# Patient Record
Sex: Female | Born: 1960
Health system: Southern US, Community
[De-identification: ages and names within clinical notes are randomized; demographics above are authoritative.]

## PROBLEM LIST (undated history)

## (undated) DIAGNOSIS — E119 Type 2 diabetes mellitus without complications: Secondary | ICD-10-CM

## (undated) DIAGNOSIS — M069 Rheumatoid arthritis, unspecified: Secondary | ICD-10-CM

## (undated) DIAGNOSIS — M75100 Unspecified rotator cuff tear or rupture of unspecified shoulder, not specified as traumatic: Secondary | ICD-10-CM

## (undated) HISTORY — PX: TUBAL LIGATION: SHX77

## (undated) HISTORY — DX: Unspecified rotator cuff tear or rupture of unspecified shoulder, not specified as traumatic: M75.100

## (undated) HISTORY — DX: Rheumatoid arthritis, unspecified: M06.9

## (undated) HISTORY — PX: OTHER SURGICAL HISTORY: SHX169

---

## 2000-12-02 ENCOUNTER — Ambulatory Visit (HOSPITAL_COMMUNITY): Admission: RE | Admit: 2000-12-02 | Discharge: 2000-12-02 | Payer: Self-pay | Admitting: Family Medicine

## 2000-12-02 ENCOUNTER — Encounter: Payer: Self-pay | Admitting: Family Medicine

## 2002-04-17 ENCOUNTER — Encounter: Payer: Self-pay | Admitting: Internal Medicine

## 2002-04-17 ENCOUNTER — Ambulatory Visit (HOSPITAL_COMMUNITY): Admission: RE | Admit: 2002-04-17 | Discharge: 2002-04-17 | Payer: Self-pay | Admitting: Internal Medicine

## 2002-07-14 ENCOUNTER — Emergency Department (HOSPITAL_COMMUNITY): Admission: EM | Admit: 2002-07-14 | Discharge: 2002-07-14 | Payer: Self-pay | Admitting: Emergency Medicine

## 2003-04-24 ENCOUNTER — Ambulatory Visit (HOSPITAL_COMMUNITY): Admission: RE | Admit: 2003-04-24 | Discharge: 2003-04-24 | Payer: Self-pay | Admitting: Family Medicine

## 2003-04-24 ENCOUNTER — Encounter: Payer: Self-pay | Admitting: Family Medicine

## 2004-04-30 ENCOUNTER — Ambulatory Visit (HOSPITAL_COMMUNITY): Admission: RE | Admit: 2004-04-30 | Discharge: 2004-04-30 | Payer: Self-pay | Admitting: Family Medicine

## 2004-09-06 ENCOUNTER — Emergency Department (HOSPITAL_COMMUNITY): Admission: EM | Admit: 2004-09-06 | Discharge: 2004-09-06 | Payer: Self-pay | Admitting: Emergency Medicine

## 2004-09-07 ENCOUNTER — Ambulatory Visit: Payer: Self-pay | Admitting: Orthopedic Surgery

## 2005-05-04 ENCOUNTER — Ambulatory Visit (HOSPITAL_COMMUNITY): Admission: RE | Admit: 2005-05-04 | Discharge: 2005-05-04 | Payer: Self-pay | Admitting: Family Medicine

## 2006-05-26 ENCOUNTER — Ambulatory Visit (HOSPITAL_COMMUNITY): Admission: RE | Admit: 2006-05-26 | Discharge: 2006-05-26 | Payer: Self-pay | Admitting: Family Medicine

## 2007-05-29 ENCOUNTER — Ambulatory Visit (HOSPITAL_COMMUNITY): Admission: RE | Admit: 2007-05-29 | Discharge: 2007-05-29 | Payer: Self-pay | Admitting: Family Medicine

## 2008-05-15 ENCOUNTER — Emergency Department (HOSPITAL_COMMUNITY): Admission: EM | Admit: 2008-05-15 | Discharge: 2008-05-15 | Payer: Self-pay | Admitting: Emergency Medicine

## 2008-05-31 ENCOUNTER — Ambulatory Visit (HOSPITAL_COMMUNITY): Admission: RE | Admit: 2008-05-31 | Discharge: 2008-05-31 | Payer: Self-pay | Admitting: Family Medicine

## 2009-10-07 ENCOUNTER — Ambulatory Visit (HOSPITAL_COMMUNITY): Admission: RE | Admit: 2009-10-07 | Discharge: 2009-10-07 | Payer: Self-pay | Admitting: Family Medicine

## 2009-12-06 ENCOUNTER — Emergency Department (HOSPITAL_COMMUNITY)
Admission: EM | Admit: 2009-12-06 | Discharge: 2009-12-06 | Payer: Self-pay | Source: Home / Self Care | Admitting: Emergency Medicine

## 2011-05-03 LAB — URINALYSIS, ROUTINE W REFLEX MICROSCOPIC
Ketones, ur: NEGATIVE
pH: 5

## 2011-05-03 LAB — BASIC METABOLIC PANEL
CO2: 24
Calcium: 9
GFR calc Af Amer: >60
GFR calc non Af Amer: >60
Glucose, Bld: 208 — ABNORMAL HIGH
Potassium: 3.7
Sodium: 134 — ABNORMAL LOW

## 2011-05-03 LAB — GLUCOSE, CAPILLARY

## 2011-05-10 ENCOUNTER — Other Ambulatory Visit (HOSPITAL_COMMUNITY): Payer: Self-pay | Admitting: Family Medicine

## 2011-05-10 DIAGNOSIS — Z139 Encounter for screening, unspecified: Secondary | ICD-10-CM

## 2011-05-11 ENCOUNTER — Ambulatory Visit (HOSPITAL_COMMUNITY)
Admission: RE | Admit: 2011-05-11 | Discharge: 2011-05-11 | Disposition: A | Discharge status: Home / Self Care | Payer: BC Managed Care – PPO | Source: Ambulatory Visit | Attending: Family Medicine | Admitting: Family Medicine

## 2011-05-11 DIAGNOSIS — Z1231 Encounter for screening mammogram for malignant neoplasm of breast: Secondary | ICD-10-CM | POA: Insufficient documentation

## 2011-05-11 DIAGNOSIS — Z139 Encounter for screening, unspecified: Secondary | ICD-10-CM

## 2011-06-03 ENCOUNTER — Other Ambulatory Visit: Payer: Self-pay | Admitting: Obstetrics & Gynecology

## 2011-06-03 ENCOUNTER — Other Ambulatory Visit (HOSPITAL_COMMUNITY)
Admission: RE | Admit: 2011-06-03 | Discharge: 2011-06-03 | Disposition: A | Discharge status: Home / Self Care | Payer: BC Managed Care – PPO | Source: Ambulatory Visit | Attending: Obstetrics & Gynecology | Admitting: Obstetrics & Gynecology

## 2011-06-03 DIAGNOSIS — Z1159 Encounter for screening for other viral diseases: Secondary | ICD-10-CM | POA: Insufficient documentation

## 2012-01-03 ENCOUNTER — Other Ambulatory Visit (HOSPITAL_COMMUNITY): Payer: Self-pay | Admitting: Family Medicine

## 2012-01-03 ENCOUNTER — Ambulatory Visit (HOSPITAL_COMMUNITY)
Admission: RE | Admit: 2012-01-03 | Discharge: 2012-01-03 | Disposition: A | Discharge status: Home / Self Care | Payer: BC Managed Care – PPO | Source: Ambulatory Visit | Attending: Family Medicine | Admitting: Family Medicine

## 2012-01-03 DIAGNOSIS — M25511 Pain in right shoulder: Secondary | ICD-10-CM

## 2012-01-03 DIAGNOSIS — R079 Chest pain, unspecified: Secondary | ICD-10-CM | POA: Insufficient documentation

## 2012-01-03 DIAGNOSIS — M25519 Pain in unspecified shoulder: Secondary | ICD-10-CM | POA: Insufficient documentation

## 2012-05-24 ENCOUNTER — Other Ambulatory Visit (HOSPITAL_COMMUNITY): Payer: Self-pay | Admitting: Family Medicine

## 2012-05-24 DIAGNOSIS — Z139 Encounter for screening, unspecified: Secondary | ICD-10-CM

## 2012-05-29 ENCOUNTER — Ambulatory Visit (HOSPITAL_COMMUNITY)
Admission: RE | Admit: 2012-05-29 | Discharge: 2012-05-29 | Disposition: A | Discharge status: Home / Self Care | Payer: BC Managed Care – PPO | Source: Ambulatory Visit | Attending: Family Medicine | Admitting: Family Medicine

## 2012-05-29 DIAGNOSIS — Z1231 Encounter for screening mammogram for malignant neoplasm of breast: Secondary | ICD-10-CM | POA: Insufficient documentation

## 2012-05-29 DIAGNOSIS — Z139 Encounter for screening, unspecified: Secondary | ICD-10-CM

## 2012-07-12 ENCOUNTER — Encounter (INDEPENDENT_AMBULATORY_CARE_PROVIDER_SITE_OTHER): Payer: Self-pay | Admitting: *Deleted

## 2012-08-16 ENCOUNTER — Encounter (INDEPENDENT_AMBULATORY_CARE_PROVIDER_SITE_OTHER): Payer: Self-pay | Admitting: *Deleted

## 2012-08-16 ENCOUNTER — Telehealth (INDEPENDENT_AMBULATORY_CARE_PROVIDER_SITE_OTHER): Payer: Self-pay | Admitting: *Deleted

## 2012-08-16 ENCOUNTER — Other Ambulatory Visit (INDEPENDENT_AMBULATORY_CARE_PROVIDER_SITE_OTHER): Payer: Self-pay | Admitting: *Deleted

## 2012-08-16 DIAGNOSIS — Z1211 Encounter for screening for malignant neoplasm of colon: Secondary | ICD-10-CM

## 2012-08-16 MED ORDER — PEG-KCL-NACL-NASULF-NA ASC-C 100 G PO SOLR: 1.0000 | Freq: Once | ORAL | Status: DC

## 2012-08-16 NOTE — Telephone Encounter (Signed)
Patient needs movi prep 

## 2012-08-30 ENCOUNTER — Telehealth (INDEPENDENT_AMBULATORY_CARE_PROVIDER_SITE_OTHER): Payer: Self-pay | Admitting: *Deleted

## 2012-08-30 NOTE — Telephone Encounter (Signed)
  Procedure: tcs  Reason/Indication:  screening  Has patient had this procedure before?  no  If so, when, by whom and where?    Is there a family history of colon cancer?  no  Who?  What age when diagnosed?    Is patient diabetic?   yes      Does patient have prosthetic heart valve?  no  Do you have a pacemaker?  no  Has patient had joint replacement within last 12 months?  no  Is patient on Coumadin, Plavix and/or Aspirin? yes  Medications: metformin 500 mg 2 in am & 1 in pm, hydrocodone 10/325 mg 1 tab q 4 hrs for pain, asa 81 mg daily  Allergies: nkda  Medication Adjustment: asa 2 days, hold metformin evening before & morning of   Procedure date & time: 09/27/12 at 1030

## 2012-08-30 NOTE — Telephone Encounter (Signed)
agree

## 2012-09-15 ENCOUNTER — Ambulatory Visit (HOSPITAL_COMMUNITY)
Admission: RE | Admit: 2012-09-15 | Discharge: 2012-09-15 | Disposition: A | Discharge status: Home / Self Care | Payer: BC Managed Care – PPO | Source: Ambulatory Visit | Attending: Family Medicine | Admitting: Family Medicine

## 2012-09-15 ENCOUNTER — Other Ambulatory Visit (HOSPITAL_COMMUNITY): Payer: Self-pay | Admitting: Family Medicine

## 2012-09-15 DIAGNOSIS — M249 Joint derangement, unspecified: Secondary | ICD-10-CM | POA: Insufficient documentation

## 2012-09-15 DIAGNOSIS — IMO0002 Reserved for concepts with insufficient information to code with codable children: Secondary | ICD-10-CM | POA: Insufficient documentation

## 2012-09-15 DIAGNOSIS — Z9181 History of falling: Secondary | ICD-10-CM | POA: Insufficient documentation

## 2012-09-15 DIAGNOSIS — M779 Enthesopathy, unspecified: Secondary | ICD-10-CM

## 2012-09-15 DIAGNOSIS — M25519 Pain in unspecified shoulder: Secondary | ICD-10-CM | POA: Insufficient documentation

## 2012-09-15 DIAGNOSIS — M75100 Unspecified rotator cuff tear or rupture of unspecified shoulder, not specified as traumatic: Secondary | ICD-10-CM | POA: Insufficient documentation

## 2012-09-19 ENCOUNTER — Encounter (HOSPITAL_COMMUNITY): Payer: Self-pay | Admitting: Pharmacy Technician

## 2012-09-27 ENCOUNTER — Ambulatory Visit (HOSPITAL_COMMUNITY)
Admission: RE | Admit: 2012-09-27 | Discharge: 2012-09-27 | Disposition: A | Discharge status: Home / Self Care | Payer: BC Managed Care – PPO | Source: Ambulatory Visit | Attending: Internal Medicine | Admitting: Internal Medicine

## 2012-09-27 ENCOUNTER — Encounter (HOSPITAL_COMMUNITY): Payer: Self-pay | Admitting: *Deleted

## 2012-09-27 ENCOUNTER — Encounter (HOSPITAL_COMMUNITY)
Admission: RE | Disposition: A | Discharge status: Home / Self Care | Payer: Self-pay | Source: Ambulatory Visit | Attending: Internal Medicine

## 2012-09-27 DIAGNOSIS — D126 Benign neoplasm of colon, unspecified: Secondary | ICD-10-CM | POA: Insufficient documentation

## 2012-09-27 DIAGNOSIS — Z01812 Encounter for preprocedural laboratory examination: Secondary | ICD-10-CM | POA: Insufficient documentation

## 2012-09-27 DIAGNOSIS — Z1211 Encounter for screening for malignant neoplasm of colon: Secondary | ICD-10-CM

## 2012-09-27 DIAGNOSIS — E119 Type 2 diabetes mellitus without complications: Secondary | ICD-10-CM | POA: Insufficient documentation

## 2012-09-27 HISTORY — DX: Type 2 diabetes mellitus without complications: E11.9

## 2012-09-27 HISTORY — PX: COLONOSCOPY: SHX5424

## 2012-09-27 LAB — GLUCOSE, CAPILLARY

## 2012-09-27 SURGERY — COLONOSCOPY
Anesthesia: Moderate Sedation

## 2012-09-27 MED ORDER — MIDAZOLAM HCL 5 MG/5ML IJ SOLN
INTRAMUSCULAR | Status: DC | PRN
  Administered 2012-09-27: 2 mg via INTRAVENOUS
  Administered 2012-09-27: 1 mg via INTRAVENOUS

## 2012-09-27 MED ORDER — STERILE WATER FOR IRRIGATION IR SOLN
Status: DC | PRN
  Administered 2012-09-27: 11:00:00

## 2012-09-27 MED ORDER — MIDAZOLAM HCL 5 MG/5ML IJ SOLN
INTRAMUSCULAR | Status: AC
  Filled 2012-09-27: qty 10

## 2012-09-27 MED ORDER — SODIUM CHLORIDE 0.45 % IV SOLN
INTRAVENOUS | Status: DC
  Administered 2012-09-27: 10:00:00 via INTRAVENOUS

## 2012-09-27 MED ORDER — MEPERIDINE HCL 50 MG/ML IJ SOLN
INTRAMUSCULAR | Status: AC
  Filled 2012-09-27: qty 1

## 2012-09-27 MED ORDER — MEPERIDINE HCL 50 MG/ML IJ SOLN
INTRAMUSCULAR | Status: DC | PRN
  Administered 2012-09-27 (×2): 25 mg via INTRAVENOUS

## 2012-09-27 NOTE — Op Note (Signed)
COLONOSCOPY PROCEDURE REPORT  PATIENT:  Brooke Sawyer  MR#:  [id number] Birthdate:  1961-05-22, 52 y.o., female Endoscopist:  Dr. Malissa Hippo, MD Referred By:  Dr. Alice Reichert, MD Procedure Date: 09/27/2012  Procedure:   Colonoscopy  Indications:  Patient is 52 year old African female who is  undergoing average risk screening colonoscopy.  Informed Consent:  The procedure and risks were reviewed with the patient and informed consent was obtained.  Medications:  Demerol 50 mg IV Versed 3 mg IV  Description of procedure:  After a digital rectal exam was performed, that colonoscope was advanced from the anus through the rectum and colon to the area of the cecum, ileocecal valve and appendiceal orifice. The cecum was deeply intubated. These structures were well-seen and photographed for the record. From the level of the cecum and ileocecal valve, the scope was slowly and cautiously withdrawn. The mucosal surfaces were carefully surveyed utilizing scope tip to flexion to facilitate fold flattening as needed. The scope was pulled down into the rectum where a thorough exam including retroflexion was performed.  Findings:   Prep excellent. 3 mm polyp ablated via cold biopsy from splenic flexure. Mucosa rest of the colon and rectum was normal. Unremarkable anorectal junction.  Therapeutic/Diagnostic Maneuvers Performed:  See above  Complications:  None  Cecal Withdrawal Time:  10 minutes  Impression:  Examination performed to cecum. 3 mm polyp ablated via cold biopsy from splenic flexure. No evidence of diverticulosis.  Recommendations:  Standard instructions given. I will contact patient with biopsy results and further recommendations. Given today's findings she can wait 10 years before her next screening exam.  [name] U  09/27/2012 11:34 AM  CC: Dr. Alice Reichert, MD & Dr. Bonnetta Barry ref. provider found

## 2012-09-27 NOTE — H&P (Signed)
Brooke Sawyer is an 52 y.o. female.   Chief Complaint: Patient is here for colonoscopy.  HPI: Patient is 52 year old African female who is in for screening colonoscopy. She denies abdominal pain change in bowel habits or rectal bleeding. Family history is negative for colorectal carcinoma.  Past Medical History  Diagnosis Date  . Diabetes mellitus without complication     Past Surgical History  Procedure Laterality Date  . Tubal ligation    . Dilation and currettage      Family History  Problem Relation Age of Onset  . Colon cancer Neg Hx    Social History:  reports that she has quit smoking. Her smoking use included Cigarettes. She has a 3 pack-year smoking history. She does not have any smokeless tobacco history on file. She reports that she does not drink alcohol or use illicit drugs.  Allergies: No Known Allergies  Medications Prior to Admission  Medication Sig Dispense Refill  . peg 3350 powder (MOVIPREP) 100 G SOLR Take 1 kit (100 g total) by mouth once.  1 kit  0  . aspirin EC 81 MG tablet Take 81 mg by mouth daily.      . Cyanocobalamin (B-12 PO) Take 1 tablet by mouth daily.      . fish oil-omega-3 fatty acids 1000 MG capsule Take 2 g by mouth daily.      Marland Kitchen HYDROcodone-acetaminophen (NORCO) 10-325 MG per tablet Take 1 tablet by mouth every 6 (six) hours as needed for pain.      . metFORMIN (GLUCOPHAGE) 500 MG tablet Take 500-1,000 mg by mouth 2 (two) times daily with a meal. Takes 1000 mg in the morning and 500 mg in the evening        Results for orders placed during the hospital encounter of 09/27/12 (from the past 48 hour(s))  GLUCOSE, CAPILLARY     Status: Abnormal   Collection Time    09/27/12  9:47 AM      Result Value Range   Glucose-Capillary 219 (*) 70 - 99 mg/dL   Comment 1 Notify RN     No results found.  ROS  Blood pressure 118/68, pulse 84, temperature 98.2 F (36.8 C), temperature source Oral, resp. rate 18, height 5\' 4"  (1.626 m), weight 219 lb  (99.338 kg), last menstrual period 09/24/2012, SpO2 96.00%. Physical Exam  Constitutional: She appears well-developed and well-nourished.  HENT:  Mouth/Throat: Oropharynx is clear and moist.  Eyes: No scleral icterus.  Neck: No thyromegaly present.  Cardiovascular: Normal rate, regular rhythm and normal heart sounds.   No murmur heard. Respiratory: Effort normal and breath sounds normal.  GI: Soft. She exhibits no distension and no mass. There is no tenderness.  Musculoskeletal: She exhibits no edema.  Lymphadenopathy:    She has no cervical adenopathy.  Neurological: She is alert.  Skin: Skin is warm.     Assessment/Plan Average risk screening colonoscopy.  Raiden Yearwood U 09/27/2012, 11:06 AM

## 2012-10-02 ENCOUNTER — Encounter (HOSPITAL_COMMUNITY): Payer: Self-pay | Admitting: Internal Medicine

## 2012-10-04 ENCOUNTER — Encounter (INDEPENDENT_AMBULATORY_CARE_PROVIDER_SITE_OTHER): Payer: Self-pay | Admitting: *Deleted

## 2012-10-18 ENCOUNTER — Encounter: Payer: Self-pay | Admitting: Orthopedic Surgery

## 2012-10-18 ENCOUNTER — Ambulatory Visit (INDEPENDENT_AMBULATORY_CARE_PROVIDER_SITE_OTHER): Payer: BC Managed Care – PPO | Admitting: Orthopedic Surgery

## 2012-10-18 VITALS — BP 140/82 | Ht 64.0 in | Wt 213.0 lb

## 2012-10-18 DIAGNOSIS — M75110 Incomplete rotator cuff tear or rupture of unspecified shoulder, not specified as traumatic: Secondary | ICD-10-CM | POA: Insufficient documentation

## 2012-10-18 DIAGNOSIS — M75111 Incomplete rotator cuff tear or rupture of right shoulder, not specified as traumatic: Secondary | ICD-10-CM

## 2012-10-18 DIAGNOSIS — M67919 Unspecified disorder of synovium and tendon, unspecified shoulder: Secondary | ICD-10-CM

## 2012-10-18 DIAGNOSIS — M75101 Unspecified rotator cuff tear or rupture of right shoulder, not specified as traumatic: Secondary | ICD-10-CM

## 2012-10-18 NOTE — Progress Notes (Addendum)
Patient ID: Brooke Sawyer, female   DOB: [birth year], 52 y.o.   MRN: [id number] Chief Complaint  Patient presents with  . Shoulder Pain    Right shoulder pain Referred by Dr. Renard Matter   History  The patient complains of right shoulder pain after she fell onto her right shoulder a year ago. She did not seek medical treatment treated herself with active range of motion and regain most of her range of motion and strength.  She now presents with sharp constant pain over the right shoulder in the morning as well as the evening and is worse with any "wrong" movement. This includes internal rotation primarily. She tried some medications it did not help she reports a catching sensation and loss of full range of motion. She does not complaining of weakness.  She is oriented and MRI and she has had x-ray  The x-ray and MRI show that she has a partial rotator cuff tear topside down or bursal surface tear.  Medications aspirin metformin and hydrocodone 10 mg  Family history diabetes  Social history married.  BP 140/82  Ht 5\' 4"  (1.626 m)  Wt 213 lb (96.616 kg)  BMI 36.54 kg/m2 General appearance is normal, the patient is alert and oriented x3 with normal mood and affect.  Ambulation is normal  Neck is nontender with full range of motion no swelling skin normal  Left shoulder no swelling no tenderness, full range of motion. Apprehension test normal. Rotator cuff strength 5. Skin normal. No palpable lymph nodes. Normal pulse and normal sensation  Right shoulder tenderness in the bursal area. Primarily inferior to the posterior acromion. Flexion and external rotation and internal rotation are limited active and passive I can passively flex his shoulder to 150 but has painful between 90 and 150. Apprehension test is normal cuff strength grade 5 skin normal. Pulse normal. Sensation normal. Lymph nodes negative.  I reviewed the x-rays the MRI and both reports and I agree she is a partial rotator cuff  tear with some elements of full thickness component she has some mild a.c. joint arthrosis not symptomatic there is bursitis as well as arthritis  Base of the patient's clinical findings I think that strength wise I cannot make her better with surgery however we can improve her range of motion with injection and therapy  If she doesn't get better after 6 weeks we'll consider surgical treatment  Shoulder Injection Procedure Note   Pre-operative Diagnosis: right  RC Syndrome  Post-operative Diagnosis: same  Indications: pain   Anesthesia: ethyl chloride   Procedure Details   Verbal consent was obtained for the procedure. The shoulder was prepped withalcohol and the skin was anesthetized. A 20 gauge needle was advanced into the subacromial space through posterior approach without difficulty  The space was then injected with 3 ml 1% lidocaine and 1 ml of depomedrol. The injection site was cleansed with isopropyl alcohol and a dressing was applied.  Complications:  None; patient tolerated the procedure well.  Partial nontraumatic tear of rotator cuff, right  Rotator cuff syndrome of right shoulder

## 2012-10-18 NOTE — Patient Instructions (Addendum)
You have received a steroid shot. 15% of patients experience increased pain at the injection site with in the next 24 hours. This is best treated with ice and tylenol extra strength 2 tabs every 8 hours. If you are still having pain please call the office.   Home exercises  Call if no better

## 2012-10-24 ENCOUNTER — Telehealth: Payer: Self-pay | Admitting: Orthopedic Surgery

## 2012-10-24 NOTE — Telephone Encounter (Signed)
Brooke Sawyer got an injection in her right shoulder 10/18/12, she has been having sharpe pains in her arm since that day but the pain got worse this past Monday, 10/23/12.  She is taking  xtra strength Tylenol every 4 hours and also taking Hydrocodone 10/325 (that she already had from her PCP) without any relief.  Said she is also doing the exercises you gave her.

## 2012-10-25 NOTE — Telephone Encounter (Signed)
Left a message to call us back.

## 2012-10-25 NOTE — Telephone Encounter (Signed)
Patient called back relayed doctor's reply

## 2012-10-25 NOTE — Telephone Encounter (Signed)
Add ibuprofen 800 mg 3 x a day

## 2012-11-28 ENCOUNTER — Encounter: Payer: Self-pay | Admitting: Orthopedic Surgery

## 2012-11-28 ENCOUNTER — Ambulatory Visit (INDEPENDENT_AMBULATORY_CARE_PROVIDER_SITE_OTHER): Payer: BC Managed Care – PPO | Admitting: Orthopedic Surgery

## 2012-11-28 VITALS — BP 130/80 | Ht 64.0 in | Wt 213.0 lb

## 2012-11-28 DIAGNOSIS — M75110 Incomplete rotator cuff tear or rupture of unspecified shoulder, not specified as traumatic: Secondary | ICD-10-CM

## 2012-11-28 DIAGNOSIS — M75111 Incomplete rotator cuff tear or rupture of right shoulder, not specified as traumatic: Secondary | ICD-10-CM

## 2012-11-28 NOTE — Patient Instructions (Addendum)
Here is some information if you decide to have surgery   Surgery for Rotator Cuff Tear with Rehab Rotator cuff surgery is only recommended for individuals who have experienced persistent disability for greater than 3 months of non-surgical (conservative) treatment. Surgery is not necessary but is recommended for individuals who experience difficulty completing daily activities or athletes who are unable to compete. Rotator cuff tears do not usually heal without surgical intervention. If left alone small rotator cuff tears usually become larger. Younger athletes who have a rotator cuff tear may be recommended for surgery without attempting conservative rehabilitation. The purpose of surgery is to regain function of the shoulder joint and eliminate pain associated with the injury. In addition to repairing the tendon tear, the surgery often removes a portion of the bony roof of the shoulder (acromion) as well as the chronically thickened and inflamed membrane below the acromion (subacromial bursa). REASONS NOT TO OPERATE   Infection of the shoulder.  Inability to complete a rehabilitation program.  Patients who have other conditions (emotional or psychological) conditions that contribute to their shoulder condition. RISKS AND COMPLICATIONS  Infection.  Re-tear of the rotator cuff tendons or muscles.  Shoulder stiffness and/or weakness.  Inability to compete in athletics.  Acromioclavicular (AC) joint paint.  Risks of surgery: infection, bleeding, nerve damage, or damage to surrounding tissues. TECHNIQUE There are different surgical procedures used to treat rotator cuff tears. The type of procedure depends on the extent of injury as well as the surgeon's preference. All of the surgical techniques for rotator cuff tears have the same goal of repairing the torn tendon, removing part of the acromion, and removing the subacromial bursa. There are two main types of procedures: arthroscopic and open  incision. Arthroscopic procedures are usually completed and you go home the same day as surgery (outpatient). These procedures use multiple small incisions in which tools and a video camera are placed to work on the shoulder. An electric shaver removes the bursa, then a power burr shaves down the portion of the acromion that places pressure on the rotator cuff. Finally the rotator cuff is sewed (sutured) back to the humeral head. Open incision procedures require a larger incision. The deltoid muscle is detached from the acromion and a ligament in the shoulder (coracoacromial) is cut in order for the surgeon to access the rotator cuff. The subacromial bursa is removed as well as part of the acromion to give the rotator cuff room to move freely. The torn tendon is then sutured to the humeral head. After the rotator cuff is repaired, then the deltoid is reattached and the incision is closed up.  RECOVERY   Post-operative care depends on the surgical technique and the preferences of your therapist.  Keep the wound clean and dry for the first 10 to 14 days after surgery.  Keep your shoulder and arm in the sling provided to you for as long as you have been instructed to.  You will be given pain medications by your caregiver.  Passive (without using muscles) shoulder movements may be begun immediately after surgery.  It is important to follow through with you rehabilitation program in order to have the best possible recovery. RETURN TO SPORTS   The rehabilitation period will depend on the sport and position you play as well as the success of the operation.  The minimum recovery period is 6 months.  You must have regained complete shoulder motion and strength before returning to sports. SEEK IMMEDIATE MEDICAL CARE IF:  Any medications produce adverse side effects.  Any complications from surgery occur:  Pain, numbness, or coldness in the extremity operated upon.  Discoloration of the nail  beds (they become blue or gray) of the extremity operated upon.  Signs of infections (fever, pain, inflammation, redness, or persistent bleeding). EXERCISES  RANGE OF MOTION (ROM) AND STRETCHING EXERCISES - Rotator Cuff Tear, Surgery For These exercises may help you restore your elbow mobility once your physician has discontinued your immobilization period. Beginning these before your provider's approval may result in delayed healing. Your symptoms may resolve with or without further involvement from your physician, physical therapist or athletic trainer. While completing these exercises, remember:   Restoring tissue flexibility helps normal motion to return to the joints. This allows healthier, less painful movement and activity.  An effective stretch should be held for at least 30 seconds. A stretch should never be painful. You should only feel a gentle lengthening or release in the stretch. ROM - Pendulum   Bend at the waist so that your right / left arm falls away from your body. Support yourself with your opposite hand on a solid surface, such as a table or a countertop.  Your right / left arm should be perpendicular to the ground. If it is not perpendicular, you need to lean over farther. Relax the muscles in your right / left arm and shoulder as much as possible.  Gently sway your hips and trunk so they move your right / left arm without any use of your right / left shoulder muscles.  Progress your movements so that your right / left arm moves side to side, then forward and backward, and finally, both clockwise and counterclockwise.  Complete __________ repetitions in each direction. Many people use this exercise to relieve discomfort in their shoulder as well as to gain range of motion. Repeat __________ times. Complete this exercise __________ times per day. STRETCH  Flexion, Seated   Sit in a firm chair so that your right / left forearm can rest on a table or on a table or  countertop. Your right / left elbow should rest below the height of your shoulder so that your shoulder feels supported and not tense or uncomfortable.  Keeping your right / left shoulder relaxed, lean forward at your waist, allowing your right / left hand to slide forward. Bend forward until you feel a moderate stretch in your shoulder, but before you feel an increase in your pain.  Hold __________ seconds. Slowly return to your starting position. Repeat __________ times. Complete this exercise __________ times per day.  STRETCH  Flexion, Standing   Stand with good posture. With an underhand grip on your right / left and an overhand grip on the opposite hand, grasp a broomstick or cane so that your hands are a little more than shoulder-width apart.  Keeping your right / left elbow straight and shoulder muscles relaxed, push the stick with your opposite hand to raise your right / left arm in front of your body and then overhead. Raise your arm until you feel a stretch in your right / left shoulder, but before you have increased shoulder pain.  Try to avoid shrugging your right / left shoulder as your arm rises by keeping your shoulder blade tucked down and toward your mid-back spine. Hold __________ seconds.  Slowly return to the starting position. Repeat __________ times. Complete this exercise __________ times per day.  STRETCH  Abduction, Supine   Stand with good posture.  With an underhand grip on your right / left and an overhand grip on the opposite hand, grasp a broomstick or cane so that your hands are a little more than shoulder-width apart.  Keeping your right / left elbow straight and shoulder muscles relaxed, push the stick with your opposite hand to raise your right / left arm out to the side of your body and then overhead. Raise your arm until you feel a stretch in your right / left shoulder, but before you have increased shoulder pain.  Try to avoid shrugging your right / left  shoulder as your arm rises by keeping your shoulder blade tucked down and toward your mid-back spine. Hold __________ seconds.  Slowly return to the starting position. Repeat __________ times. Complete this exercise __________ times per day.  ROM  Flexion, Active-Assisted  Lie on your back. You may bend your knees for comfort.  Grasp a broomstick or cane so your hands are about shoulder-width apart. Your right / left hand should grip the end of the stick/cane so that your hand is positioned "thumbs-up," as if you were about to shake hands.  Using your healthy arm to lead, raise your right / left arm overhead until you feel a gentle stretch in your shoulder. Hold __________ seconds.  Use the stick/cane to assist in returning your right / left arm to its starting position. Repeat __________ times. Complete this exercise __________ times per day.  STRETCH  External Rotation   Tuck a folded towel or small ball under your right / left upper arm. Grasp a broomstick or cane with an underhand grasp a little more than shoulder width apart. Bend your elbows to 90 degrees.  Stand with good posture or sit in a chair without arms.  Use your strong arm to push the stick across your body. Do not allow the towel or ball to fall. This will rotate your right / left arm away from your abdomen. Using the stick turn/rotate your hand and forearm away from your body. Hold __________ seconds. Repeat __________ times. Complete this exercise __________ times per day.  STRENGTHENING EXERCISES - Rotator Cuff Tear, Surgery For These exercises may help you begin to restore your elbow strength in the initial stage of your rehabilitation. Your physician will determine when you begin these exercises depending on the severity of your injury and the integrity of your repaired tissues. Beginning these before your provider's approval may result in delayed healing. While completing these exercises, remember:   Muscles can gain  both the endurance and the strength needed for everyday activities through controlled exercises.  Complete these exercises as instructed by your physician, physical therapist or athletic trainer. Progress the resistance and repetitions only as guided.  You may experience muscle soreness or fatigue, but the pain or discomfort you are trying to eliminate should never worsen during these exercises. If this pain does worsen, stop and make certain you are following the directions exactly. If the pain is still present after adjustments, discontinue the exercise until you can discuss the trouble with your clinician. STRENGTH - Shoulder Flexion, Isometric   With good posture and facing a wall, stand or sit about 4-6 inches away.  Keeping your right / left elbow straight, gently press the top of your fist into the wall. Increase the pressure gradually until you are pressing as hard as you can without shrugging your shoulder or increasing any shoulder discomfort.  Hold __________ seconds. Release the tension slowly. Relax your shoulder muscles completely  before you do the next repetition. Repeat __________ times. Complete this exercise __________ times per day.  STRENGTH - Shoulder Abductors, Isometric   With good posture, stand or sit about 4-6 inches from a wall with your right / left side facing the wall.  Bend your right / left elbow. Gently press your right / left elbow into the wall. Increase the pressure gradually until you are pressing as hard as you can without shrugging your shoulder or increasing any shoulder discomfort.  Hold __________ seconds.  Release the tension slowly. Relax your shoulder muscles completely before you do the next repetition. Repeat __________ times. Complete this exercise __________ times per day.  STRENGTH - Internal Rotators, Isometric   Keep your right / left elbow at your side and bend it 90 degrees.  Step into a door frame so that the inside of your right /  left wrist can press against the door frame without your upper arm leaving your side.  Gently press your right / left wrist into the door frame as if you were trying to draw the palm of your hand to your abdomen. Gradually increase the tension until you are pressing as hard as you can without shrugging your shoulder or increasing any shoulder discomfort.  Hold __________ seconds.  Release the tension slowly. Relax your shoulder muscles completely before you do the next repetition. Repeat __________ times. Complete this exercise __________ times per day.  STRENGTH - External Rotators, Isometric   Keep your right / left elbow at your side and bend it 90 degrees.  Step into a door frame so that the outside of your right / left wrist can press against the door frame without your upper arm leaving your side.  Gently press your right / left wrist into the door frame as if you were trying to swing the back of your hand away from your abdomen. Gradually increase the tension until you are pressing as hard as you can without shrugging your shoulder or increasing any shoulder discomfort.  Hold __________ seconds.  Release the tension slowly. Relax your shoulder muscles completely before you do the next repetition. Repeat __________ times. Complete this exercise __________ times per day.  Document Released: 07/19/2005 Document Revised: 10/11/2011 Document Reviewed: 10/31/2008 [facility] Patient Information 2013 [place], [place].  You have been scheduled for rotator cuff surgery.  All surgeries carry some risk.  Remember you always have the option of continued nonsurgical treatment. However in this situation the risks vs. the benefits favor surgery as the best treatment option. The risks of the surgery includes the following but is not limited to bleeding, infection, pulmonary embolus, death from anesthesia, nerve injury vascular injury or need for further surgery, continued pain.  Specific to this  procedure the following risks and complications are rare but possible Stiffness, pain, weakness, re tear.  I expect 9-12 months to fully recover from this procedure.

## 2012-11-28 NOTE — Progress Notes (Signed)
Patient ID: Brooke Sawyer, female   DOB: [birth year], 52 y.o.   MRN: [id number] Chief Complaint  Patient presents with  . Follow-up    Recheck right shoulder post injection.   Ht 5\' 4"  (1.626 m)  Wt 213 lb (96.616 kg)  BMI 36.54 kg/m2    Status post physical therapy and injection presents with continued pain popping right shoulder  MRI was done prior to last visit IMPRESSION: 1.  Near full-thickness anterior insertional supraspinatus tendon tear extending to the bursal surface.  About 20% of the articular surface fibers remain intact and there is no significant retraction.  Width of the intrasubstance component of the tear is 23 mm and extends into the conjoined fibers of supraspinatus and infraspinatus. 2. Subacromial bursitis. 3.  Mild glenohumeral osteoarthritis.  The patient reports increased pain swelling and crepitance decreased range of motion seems to bother her more at night  We talked about surgery she did not want to proceed  She opted for injection  We injected the subacromial space of the right shoulder  Her physical findings were as follows  BP 130/80  Ht 5\' 4"  (1.626 m)  Wt 213 lb (96.616 kg)  BMI 36.54 kg/m2 General appearance is normal, the patient is alert and oriented x3 with normal mood and affect. Right shoulder inspection tenderness in the anterior joint line anterolateral deltoid with forward elevation only 100 weakness of the supraspinatus tendon to manual muscle testing but the shoulder was stable, strength as stated skin intact pulses were good sensation was normal  We did a subacromial injection of the right shoulder  She will think about the surgical treatment as we discussed  Shoulder Injection Procedure Note   Pre-operative Diagnosis: right  RC Syndrome  Post-operative Diagnosis: same  Indications: pain   Anesthesia: ethyl chloride   Procedure Details   Verbal consent was obtained for the procedure. The shoulder was prepped  withalcohol and the skin was anesthetized. A 20 gauge needle was advanced into the subacromial space through posterior approach without difficulty  The space was then injected with 3 ml 1% lidocaine and 1 ml of depomedrol. The injection site was cleansed with isopropyl alcohol and a dressing was applied.  Complications:  None; patient tolerated the procedure well.

## 2012-12-05 ENCOUNTER — Telehealth: Payer: Self-pay | Admitting: Orthopedic Surgery

## 2012-12-05 NOTE — Telephone Encounter (Signed)
Patient called, 1 week following office visit and injection to right shoulder, to relay that the injection has not helped much.  She said that her shoulder really has been sore, and that she knows she will need to decide about the surgery, as per her office note.  She will consider having it, possibly over the summer, and will call to schedule another appointment to further discuss, closer to that time.  She is continuing her home exercises until then, unless there are any other recommendations.  Her ph# is [phone].

## 2012-12-06 NOTE — Telephone Encounter (Signed)
Returned call to patient, and she stated Okey Regal had answered all her questions. She said she would call back when ready for surgery.

## 2012-12-14 ENCOUNTER — Telehealth: Payer: Self-pay | Admitting: Orthopedic Surgery

## 2012-12-14 ENCOUNTER — Other Ambulatory Visit: Payer: Self-pay | Admitting: *Deleted

## 2012-12-14 DIAGNOSIS — M25511 Pain in right shoulder: Secondary | ICD-10-CM

## 2012-12-14 MED ORDER — HYDROCODONE-ACETAMINOPHEN 5-325 MG PO TABS: 1.0000 | ORAL_TABLET | ORAL | Status: DC | PRN

## 2012-12-14 NOTE — Telephone Encounter (Signed)
norco 5 mg # 30  1 q 4 prn pain   No refills

## 2012-12-14 NOTE — Telephone Encounter (Signed)
Forwarding to Dr. Harrison 

## 2012-12-14 NOTE — Telephone Encounter (Signed)
Faxed prescription to [facility] [place] 

## 2012-12-19 ENCOUNTER — Telehealth: Payer: Self-pay | Admitting: Orthopedic Surgery

## 2012-12-19 NOTE — Telephone Encounter (Signed)
Patient called to say, that the Norco 5-325 is not helping her pain like the Norco 10-325 that she was previously on. I advised her to try to alternate with Ibuprofen, and she stated she had already tried that. She wanted to know if you would give her a prescription for 10-325 until she could have her surgery?

## 2012-12-19 NOTE — Telephone Encounter (Signed)
Brooke Sawyer needs you to call her, she has a question about the medicine called in

## 2012-12-20 ENCOUNTER — Other Ambulatory Visit: Payer: Self-pay | Admitting: *Deleted

## 2012-12-20 NOTE — Telephone Encounter (Signed)
NO

## 2012-12-20 NOTE — Telephone Encounter (Signed)
Spoke with patient and relayed Dr. Mort Sawyers reply.

## 2013-05-04 ENCOUNTER — Other Ambulatory Visit (HOSPITAL_COMMUNITY): Payer: Self-pay | Admitting: Family Medicine

## 2013-05-04 DIAGNOSIS — Z139 Encounter for screening, unspecified: Secondary | ICD-10-CM

## 2013-06-04 ENCOUNTER — Ambulatory Visit (HOSPITAL_COMMUNITY)
Admission: RE | Admit: 2013-06-04 | Discharge: 2013-06-04 | Disposition: A | Discharge status: Home / Self Care | Payer: BC Managed Care – PPO | Source: Ambulatory Visit | Attending: Family Medicine | Admitting: Family Medicine

## 2013-06-04 DIAGNOSIS — Z139 Encounter for screening, unspecified: Secondary | ICD-10-CM

## 2013-06-04 DIAGNOSIS — Z1231 Encounter for screening mammogram for malignant neoplasm of breast: Secondary | ICD-10-CM | POA: Insufficient documentation

## 2013-06-26 ENCOUNTER — Ambulatory Visit: Payer: Self-pay | Admitting: Obstetrics and Gynecology

## 2013-07-24 ENCOUNTER — Encounter: Payer: Self-pay | Admitting: *Deleted

## 2013-08-23 ENCOUNTER — Encounter (INDEPENDENT_AMBULATORY_CARE_PROVIDER_SITE_OTHER): Payer: Self-pay

## 2013-08-23 ENCOUNTER — Ambulatory Visit (INDEPENDENT_AMBULATORY_CARE_PROVIDER_SITE_OTHER): Payer: BC Managed Care – PPO | Admitting: Obstetrics & Gynecology

## 2013-08-23 ENCOUNTER — Encounter: Payer: Self-pay | Admitting: Obstetrics & Gynecology

## 2013-08-23 VITALS — BP 140/80 | Ht 64.0 in | Wt 203.0 lb

## 2013-08-23 DIAGNOSIS — R87610 Atypical squamous cells of undetermined significance on cytologic smear of cervix (ASC-US): Secondary | ICD-10-CM

## 2013-08-23 DIAGNOSIS — R87620 Atypical squamous cells of undetermined significance on cytologic smear of vagina (ASC-US): Secondary | ICD-10-CM

## 2013-08-23 NOTE — Progress Notes (Signed)
Patient ID: ILLEANA EDICK, female   DOB: 13-Mar-1961, 53 y.o.   MRN: [id number] J. is in once again for consultation She has had atypical squamous cell Paps for the last couple of years with negative HPV This Pap is reviewed this year from November and is once again atypical squamous cells of undetermined significance but with negative HPV  As a result no further followup is needed She can continue to have her Paps and a yearly basis and will see her in November for that   Past Medical History  Diagnosis Date  . Diabetes mellitus without complication     Past Surgical History  Procedure Laterality Date  . Tubal ligation    . Dilation and currettage    . Colonoscopy N/A 09/27/2012    Procedure: COLONOSCOPY;  Surgeon: Rogene Houston, MD;  Location: [facility];  Service: Endoscopy;  Laterality: N/A;  84    OB History   Grav Para Term Preterm Abortions TAB SAB Ect Mult Living                  No Known Allergies  History   Social History  . Marital Status: Married    Spouse Name: N/A    Number of Children: N/A  . Years of Education: N/A   Social History Main Topics  . Smoking status: Former Smoker -- 0.50 packs/day for 6 years    Types: Cigarettes  . Smokeless tobacco: None  . Alcohol Use: No  . Drug Use: No  . Sexual Activity: None   Other Topics Concern  . None   Social History Narrative  . None    Family History  Problem Relation Age of Onset  . Colon cancer Neg Hx   . Diabetes Mother   . Heart disease Mother   . Diabetes Father

## 2014-05-02 ENCOUNTER — Other Ambulatory Visit (HOSPITAL_COMMUNITY): Payer: Self-pay | Admitting: Family Medicine

## 2014-05-02 DIAGNOSIS — Z1239 Encounter for other screening for malignant neoplasm of breast: Secondary | ICD-10-CM

## 2014-06-10 ENCOUNTER — Ambulatory Visit (HOSPITAL_COMMUNITY)
Admission: RE | Admit: 2014-06-10 | Discharge: 2014-06-10 | Disposition: A | Discharge status: Home / Self Care | Payer: BC Managed Care – PPO | Source: Ambulatory Visit | Attending: Family Medicine | Admitting: Family Medicine

## 2014-06-10 DIAGNOSIS — Z1231 Encounter for screening mammogram for malignant neoplasm of breast: Secondary | ICD-10-CM | POA: Diagnosis present

## 2014-06-10 DIAGNOSIS — Z1239 Encounter for other screening for malignant neoplasm of breast: Secondary | ICD-10-CM

## 2014-06-17 ENCOUNTER — Other Ambulatory Visit (HOSPITAL_COMMUNITY)
Admission: RE | Admit: 2014-06-17 | Discharge: 2014-06-17 | Disposition: A | Discharge status: Home / Self Care | Payer: BC Managed Care – PPO | Source: Ambulatory Visit | Attending: Obstetrics & Gynecology | Admitting: Obstetrics & Gynecology

## 2014-06-17 ENCOUNTER — Ambulatory Visit (INDEPENDENT_AMBULATORY_CARE_PROVIDER_SITE_OTHER): Payer: BC Managed Care – PPO | Admitting: Obstetrics & Gynecology

## 2014-06-17 ENCOUNTER — Encounter: Payer: Self-pay | Admitting: Obstetrics & Gynecology

## 2014-06-17 VITALS — BP 120/70 | Ht 63.4 in | Wt 218.0 lb

## 2014-06-17 DIAGNOSIS — Z01419 Encounter for gynecological examination (general) (routine) without abnormal findings: Secondary | ICD-10-CM | POA: Diagnosis not present

## 2014-06-17 DIAGNOSIS — Z1151 Encounter for screening for human papillomavirus (HPV): Secondary | ICD-10-CM | POA: Diagnosis present

## 2014-06-17 MED ORDER — FLUCONAZOLE 150 MG PO TABS: 150.0000 mg | ORAL_TABLET | Freq: Once | ORAL | Status: DC

## 2014-06-17 NOTE — Progress Notes (Signed)
Patient ID: Brooke Sawyer, female   DOB: 1960-08-09, 53 y.o.   MRN: [id number] Pt states she just need a pap smear, not having any difficulty  Blood pressure 120/70, height 5' 3.4" (1.61 m), weight 218 lb (98.884 kg), last menstrual period 05/02/2014.  No burning with urination, frequency or urgency No nausea, vomiting or diarrhea Nor fever chills or other constitutional symptoms  Vulva is normal without lesions Vagina is pink moist without discharge, mild POP Cervix normal in appearance and pap is normal Uterus is normal size shape and contour Adnexa is negative with normal sized ovaries by sonogram  Small skin tag left buttock, may want off in the future

## 2014-06-19 LAB — CYTOLOGY - PAP

## 2014-07-02 ENCOUNTER — Ambulatory Visit: Payer: BC Managed Care – PPO | Admitting: Obstetrics & Gynecology

## 2014-07-22 ENCOUNTER — Ambulatory Visit: Payer: BC Managed Care – PPO | Admitting: Obstetrics & Gynecology

## 2014-07-23 ENCOUNTER — Encounter: Payer: Self-pay | Admitting: Obstetrics & Gynecology

## 2014-07-23 ENCOUNTER — Ambulatory Visit (INDEPENDENT_AMBULATORY_CARE_PROVIDER_SITE_OTHER): Payer: BC Managed Care – PPO | Admitting: Obstetrics & Gynecology

## 2014-07-23 VITALS — BP 120/80 | Wt 219.0 lb

## 2014-07-23 DIAGNOSIS — L918 Other hypertrophic disorders of the skin: Secondary | ICD-10-CM

## 2014-07-23 NOTE — Addendum Note (Signed)
Addended by: Doyne Keel on: 07/23/2014 04:27 PM   Modules accepted: Orders

## 2014-07-23 NOTE — Progress Notes (Signed)
Patient ID: Brooke Sawyer, female   DOB: 1961-06-06, 53 y.o.   MRN: [id number] Skin Tag Removal Procedure Note  Pre-operative Diagnosis: Classic skin tags (acrochordon)  Post-operative Diagnosis: Classic skin tags (acrochordon)  Locations:abdomen, Left buttock   Indications: irritated by clothing  Anesthesia: Lidocaine 1% without epinephrine   Procedure Details  The risks (including bleeding and infection) and benefits of the procedure and Written informed consent obtained. Using sterile iris scissors, multiple skin tags were snipped off at their bases after cleansing with Betadine.  Bleeding was controlled by pressure nand silver nitrate  Findings: Pathognomonic benign lesions  sent for pathological exam.  Condition: Stable  Complications: none.  Plan: 1. Instructed to keep the wounds dry and covered for 24-48h and clean thereafter. 2. Warning signs of infection were reviewed.   3. Recommended that the patient use OTC acetaminophen as needed for pain.  4. Return as needed.

## 2014-08-06 ENCOUNTER — Encounter: Payer: Self-pay | Admitting: Obstetrics & Gynecology

## 2015-06-10 ENCOUNTER — Other Ambulatory Visit (HOSPITAL_COMMUNITY): Payer: Self-pay | Admitting: Family Medicine

## 2015-06-10 DIAGNOSIS — Z1231 Encounter for screening mammogram for malignant neoplasm of breast: Secondary | ICD-10-CM

## 2015-06-23 ENCOUNTER — Ambulatory Visit (HOSPITAL_COMMUNITY)
Admission: RE | Admit: 2015-06-23 | Discharge: 2015-06-23 | Disposition: A | Discharge status: Home / Self Care | Payer: BLUE CROSS/BLUE SHIELD | Source: Ambulatory Visit | Attending: Family Medicine | Admitting: Family Medicine

## 2015-06-23 DIAGNOSIS — Z1231 Encounter for screening mammogram for malignant neoplasm of breast: Secondary | ICD-10-CM

## 2015-08-26 ENCOUNTER — Other Ambulatory Visit (HOSPITAL_COMMUNITY)
Admission: RE | Admit: 2015-08-26 | Discharge: 2015-08-26 | Disposition: A | Discharge status: Home / Self Care | Payer: BLUE CROSS/BLUE SHIELD | Source: Ambulatory Visit | Attending: Obstetrics & Gynecology | Admitting: Obstetrics & Gynecology

## 2015-08-26 ENCOUNTER — Encounter: Payer: Self-pay | Admitting: Obstetrics & Gynecology

## 2015-08-26 ENCOUNTER — Ambulatory Visit (INDEPENDENT_AMBULATORY_CARE_PROVIDER_SITE_OTHER): Payer: BLUE CROSS/BLUE SHIELD | Admitting: Obstetrics & Gynecology

## 2015-08-26 VITALS — BP 140/80 | HR 76 | Ht 64.0 in | Wt 214.0 lb

## 2015-08-26 DIAGNOSIS — Z01419 Encounter for gynecological examination (general) (routine) without abnormal findings: Secondary | ICD-10-CM | POA: Insufficient documentation

## 2015-08-26 DIAGNOSIS — Z1212 Encounter for screening for malignant neoplasm of rectum: Secondary | ICD-10-CM | POA: Diagnosis not present

## 2015-08-26 DIAGNOSIS — Z1211 Encounter for screening for malignant neoplasm of colon: Secondary | ICD-10-CM

## 2015-08-26 NOTE — Progress Notes (Signed)
Patient ID: Brooke Sawyer, female   DOB: 1961/05/28, 55 y.o.   MRN: [id number] Subjective:     Brooke Sawyer is a 55 y.o. female here for a routine exam.  No LMP recorded. Patient is not currently having periods (Reason: Perimenopausal). No obstetric history on file. Birth Control Method:  BTL Menstrual Calendar(currently): amenorrheic  Current complaints: none.   Current acute medical issues:  none   Recent Gynecologic History No LMP recorded. Patient is not currently having periods (Reason: Perimenopausal). Last Pap: 2015,  normal Last mammogram: 2015,  normal  Past Medical History  Diagnosis Date  . Diabetes mellitus without complication [facility])     Past Surgical History  Procedure Laterality Date  . Tubal ligation    . Dilation and currettage    . Colonoscopy N/A 09/27/2012    Procedure: COLONOSCOPY;  Surgeon: Rogene Houston, MD;  Location: [facility];  Service: Endoscopy;  Laterality: N/A;  1030    OB History    No data available      Social History   Social History  . Marital Status: Married    Spouse Name: N/A  . Number of Children: N/A  . Years of Education: N/A   Social History Main Topics  . Smoking status: Former Smoker -- 0.50 packs/day for 6 years    Types: Cigarettes  . Smokeless tobacco: None  . Alcohol Use: No  . Drug Use: No  . Sexual Activity: Not Asked   Other Topics Concern  . None   Social History Narrative    Family History  Problem Relation Age of Onset  . Colon cancer Neg Hx   . Diabetes Mother   . Heart disease Mother   . Diabetes Father      Current outpatient prescriptions:  .  aspirin EC 81 MG tablet, Take 81 mg by mouth daily., Disp: , Rfl:  .  canagliflozin (INVOKANA) 300 MG TABS tablet, Take 300 mg by mouth daily before breakfast., Disp: , Rfl:  .  cholecalciferol (VITAMIN D) 1000 UNITS tablet, Take 1,000 Units by mouth daily., Disp: , Rfl:  .  Cyanocobalamin (B-12 PO), Take 1 tablet by mouth daily., Disp: , Rfl:  .   glipiZIDE (GLUCOTROL) 5 MG tablet, Take by mouth daily before breakfast., Disp: , Rfl:  .  HYDROcodone-acetaminophen (NORCO/VICODIN) 5-325 MG per tablet, Take 1 tablet by mouth every 4 (four) hours as needed for pain., Disp: 30 tablet, Rfl: 0 .  metFORMIN (GLUCOPHAGE) 500 MG tablet, Take 500-1,000 mg by mouth 2 (two) times daily with a meal. Takes 1000 mg in the morning and 500 mg in the evening, Disp: , Rfl:   Review of Systems  Review of Systems  Constitutional: Negative for fever, chills, weight loss, malaise/fatigue and diaphoresis.  HENT: Negative for hearing loss, ear pain, nosebleeds, congestion, sore throat, neck pain, tinnitus and ear discharge.   Eyes: Negative for blurred vision, double vision, photophobia, pain, discharge and redness.  Respiratory: Negative for cough, hemoptysis, sputum production, shortness of breath, wheezing and stridor.   Cardiovascular: Negative for chest pain, palpitations, orthopnea, claudication, leg swelling and PND.  Gastrointestinal: negative for abdominal pain. Negative for heartburn, nausea, vomiting, diarrhea, constipation, blood in stool and melena.  Genitourinary: Negative for dysuria, urgency, frequency, hematuria and flank pain.  Musculoskeletal: Negative for myalgias, back pain, joint pain and falls.  Skin: Negative for itching and rash.  Neurological: Negative for dizziness, tingling, tremors, sensory change, speech change, focal weakness, seizures, loss of consciousness,  weakness and headaches.  Endo/Heme/Allergies: Negative for environmental allergies and polydipsia. Does not bruise/bleed easily.  Psychiatric/Behavioral: Negative for depression, suicidal ideas, hallucinations, memory loss and substance abuse. The patient is not nervous/anxious and does not have insomnia.        Objective:  Blood pressure 140/80, pulse 76, height 5\' 4"  (1.626 m), weight 214 lb (97.07 kg).   Physical Exam  Vitals reviewed. Constitutional: She is oriented to  person, place, and time. She appears well-developed and well-nourished.  HENT:  Head: Normocephalic and atraumatic.        Right Ear: External ear normal.  Left Ear: External ear normal.  Nose: Nose normal.  Mouth/Throat: Oropharynx is clear and moist.  Eyes: Conjunctivae and EOM are normal. Pupils are equal, round, and reactive to light. Right eye exhibits no discharge. Left eye exhibits no discharge. No scleral icterus.  Neck: Normal range of motion. Neck supple. No tracheal deviation present. No thyromegaly present.  Cardiovascular: Normal rate, regular rhythm, normal heart sounds and intact distal pulses.  Exam reveals no gallop and no friction rub.   No murmur heard. Respiratory: Effort normal and breath sounds normal. No respiratory distress. She has no wheezes. She has no rales. She exhibits no tenderness.  GI: Soft. Bowel sounds are normal. She exhibits no distension and no mass. There is no tenderness. There is no rebound and no guarding.  Genitourinary:  Breasts no masses skin changes or nipple changes bilaterally      Vulva is normal without lesions Vagina is pink moist without discharge Cervix normal in appearance and pap is done Uterus is normal size shape and contour Adnexa is negative with normal sized ovaries  {Rectal    hemoccult negative, normal tone, no masses Musculoskeletal: Normal range of motion. She exhibits no edema and no tenderness.  Neurological: She is alert and oriented to person, place, and time. She has normal reflexes. She displays normal reflexes. No cranial nerve deficit. She exhibits normal muscle tone. Coordination normal.  Skin: Skin is warm and dry. No rash noted. No erythema. No pallor.  Psychiatric: She has a normal mood and affect. Her behavior is normal. Judgment and thought content normal.       Assessment:    Healthy female exam.    Plan:    Mammogram ordered. Follow up in: 2 years.

## 2015-08-29 LAB — CYTOLOGY - PAP

## 2015-09-29 ENCOUNTER — Telehealth: Payer: Self-pay | Admitting: Orthopaedic Surgery

## 2015-09-29 MED ORDER — HYDROCODONE-ACETAMINOPHEN 7.5-325 MG PO TABS: 1.0000 | ORAL_TABLET | ORAL | Status: DC | PRN

## 2015-09-29 NOTE — Telephone Encounter (Signed)
Rx printed

## 2015-09-29 NOTE — Telephone Encounter (Signed)
Patient called and requested refill on Norco 7.5-325 mgs.  Qty 120   Last refilled on 08-28-15

## 2015-10-01 MED ORDER — HYDROCODONE-ACETAMINOPHEN 7.5-325 MG PO TABS: 1.0000 | ORAL_TABLET | ORAL | Status: DC | PRN

## 2015-10-01 NOTE — Telephone Encounter (Signed)
Routing to Dr Luna Glasgow

## 2015-10-01 NOTE — Telephone Encounter (Signed)
Rx printed

## 2015-10-01 NOTE — Telephone Encounter (Signed)
Patient called checking on prescription request. Please call [phone]

## 2015-10-23 ENCOUNTER — Ambulatory Visit (INDEPENDENT_AMBULATORY_CARE_PROVIDER_SITE_OTHER): Payer: BLUE CROSS/BLUE SHIELD | Admitting: Orthopaedic Surgery

## 2015-10-23 VITALS — BP 137/71 | HR 89 | Temp 97.7°F | Ht 64.0 in | Wt 214.0 lb

## 2015-10-23 DIAGNOSIS — M25511 Pain in right shoulder: Secondary | ICD-10-CM | POA: Diagnosis not present

## 2015-10-23 MED ORDER — HYDROCODONE-ACETAMINOPHEN 7.5-325 MG PO TABS: 1.0000 | ORAL_TABLET | ORAL | Status: DC | PRN

## 2015-10-24 NOTE — Progress Notes (Signed)
Patient [id number] Brooke Sawyer, female DOB:07-24-1961, 55 y.o. [id number]  Chief Complaint  Patient presents with  . Follow-up    Right shoulder pain    HPI  Brooke Sawyer is a 55 y.o. female who has a history of right shoulder pain that comes and goes.  She has no paresthesias.  She has no new trauma.  She has pain with overhead use.   Shoulder Pain  The pain is present in the right shoulder. This is a recurrent problem. The current episode started more than 1 month ago. The problem occurs daily. The problem has been gradually worsening. The quality of the pain is described as aching. The pain is at a severity of 4/10. The pain is moderate. She has tried acetaminophen, heat, NSAIDS and rest for the symptoms. The treatment provided moderate relief.    Body mass index is 36.72 kg/(m^2).   Review of Systems  Constitutional:       Patient has Diabetes Mellitus. Patient does not have hypertension. Patient does not have COPD or shortness of breath. Patient has BMI > 35. Patient does not have current smoking history.  HENT: Negative for congestion.   Respiratory: Negative for shortness of breath.   Cardiovascular: Negative for chest pain.  Endocrine: Positive for cold intolerance.  Musculoskeletal: Positive for myalgias and arthralgias.  All other systems reviewed and are negative.   Past Medical History  Diagnosis Date  . Diabetes mellitus without complication [facility])     Past Surgical History  Procedure Laterality Date  . Tubal ligation    . Dilation and currettage    . Colonoscopy N/A 09/27/2012    Procedure: COLONOSCOPY;  Surgeon: Rogene Houston, MD;  Location: [facility];  Service: Endoscopy;  Laterality: N/A;  1030    Family History  Problem Relation Age of Onset  . Colon cancer Neg Hx   . Diabetes Mother   . Heart disease Mother   . Diabetes Father     Social History Social History  Substance Use Topics  . Smoking status: Former Smoker -- 0.50 packs/day for 6  years    Types: Cigarettes  . Smokeless tobacco: Not on file  . Alcohol Use: No    No Known Allergies  Current Outpatient Prescriptions  Medication Sig Dispense Refill  . aspirin EC 81 MG tablet Take 81 mg by mouth daily.    . canagliflozin (INVOKANA) 300 MG TABS tablet Take 300 mg by mouth daily before breakfast.    . cholecalciferol (VITAMIN D) 1000 UNITS tablet Take 1,000 Units by mouth daily.    . Cyanocobalamin (B-12 PO) Take 1 tablet by mouth daily.    Marland Kitchen glipiZIDE (GLUCOTROL) 5 MG tablet Take by mouth daily before breakfast.    . HYDROcodone-acetaminophen (NORCO) 7.5-325 MG tablet Take 1 tablet by mouth every 4 (four) hours as needed for moderate pain (Must last 30 days.  Do not drive or operate machinery while taking this medicine.). 120 tablet 0  . metFORMIN (GLUCOPHAGE) 500 MG tablet Take 500-1,000 mg by mouth 2 (two) times daily with a meal. Takes 1000 mg in the morning and 500 mg in the evening     No current facility-administered medications for this visit.     Physical Exam  Blood pressure 137/71, pulse 89, temperature 97.7 F (36.5 C), height 5\' 4"  (1.626 m), weight 214 lb (97.07 kg).  Constitutional: overall normal hygiene, normal nutrition, well developed, normal grooming, normal body habitus. Assistive device:none  Musculoskeletal: gait and station Limp  none, muscle tone and strength are normal, no tremors or atrophy is present.  .  Neurological: coordination overall normal.  Deep tendon reflex/nerve stretch intact.  Sensation normal.  Cranial nerves II-XII intact.   Skin:   normal overall no scars, lesions, ulcers or rashes. No psoriasis.  Psychiatric: Alert and oriented x 3.  Recent memory intact, remote memory unclear.  Normal mood and affect. Well groomed.  Good eye contact.  Cardiovascular: overall no swelling, no varicosities, no edema bilaterally, normal temperatures of the legs and arms, no clubbing, cyanosis and good capillary refill.  Lymphatic:  palpation is normal.  Examination of right Upper Extremity is done.  Inspection:   Overall:  Elbow non-tender without crepitus or defects, forearm non-tender without crepitus or defects, wrist non-tender without crepitus or defects, hand non-tender.    Shoulder: with glenohumeral joint tenderness, without effusion.   Upper arm: without swelling and tenderness   Range of motion:   Overall:  Full range of motion of the elbow, full range of motion of wrist and full range of motion in fingers.   Shoulder:  right  160 degrees forward flexion; 145 degrees abduction; 35 degrees internal rotation, 35 degrees external rotation, 25 degrees extension, 40 degrees adduction.   Stability:   Overall:  Shoulder, elbow and wrist stable   Strength and Tone:   Overall full shoulder muscles strength, full upper arm strength and normal upper arm bulk and tone.  PROCEDURE NOTE:  The patient request injection, verbal consent was obtained.  The right shoulder was prepped appropriately after time out was performed.   Sterile technique was observed and injection of 1 cc of Depo-Medrol 40 mg with several cc's of plain xylocaine. Anesthesia was provided by ethyl chloride and a 20-gauge needle was used to inject the shoulder area. A posterior approach was used.  The injection was tolerated well.  A band aid dressing was applied.  The patient was advised to apply ice later today and tomorrow to the injection sight as needed.  I have reviewed her diabetes status.  She is on oral control and watching her diet.  Her blood sugars are good.  She is doing well with her diabetes.  I have gone over exercises for her to do.  She understands them.  The patient has been educated about the nature of the problem(s) and counseled on treatment options.  The patient appeared to understand what I have discussed and is in agreement with it.  Encounter Diagnosis  Name Primary?  . Right shoulder pain Yes    PLAN Call if any  problems.  Precautions discussed.  Continue current medications.   Return to clinic 2 months

## 2015-10-29 ENCOUNTER — Emergency Department (HOSPITAL_COMMUNITY)
Admission: EM | Admit: 2015-10-29 | Discharge: 2015-10-29 | Disposition: A | Discharge status: Home / Self Care | Payer: BLUE CROSS/BLUE SHIELD | Attending: Emergency Medicine | Admitting: Emergency Medicine

## 2015-10-29 ENCOUNTER — Emergency Department (HOSPITAL_COMMUNITY): Payer: BLUE CROSS/BLUE SHIELD

## 2015-10-29 ENCOUNTER — Encounter (HOSPITAL_COMMUNITY): Payer: Self-pay | Admitting: *Deleted

## 2015-10-29 DIAGNOSIS — Y929 Unspecified place or not applicable: Secondary | ICD-10-CM | POA: Insufficient documentation

## 2015-10-29 DIAGNOSIS — Y939 Activity, unspecified: Secondary | ICD-10-CM | POA: Diagnosis not present

## 2015-10-29 DIAGNOSIS — E119 Type 2 diabetes mellitus without complications: Secondary | ICD-10-CM | POA: Insufficient documentation

## 2015-10-29 DIAGNOSIS — Z87891 Personal history of nicotine dependence: Secondary | ICD-10-CM | POA: Insufficient documentation

## 2015-10-29 DIAGNOSIS — Z7982 Long term (current) use of aspirin: Secondary | ICD-10-CM | POA: Diagnosis not present

## 2015-10-29 DIAGNOSIS — Z7984 Long term (current) use of oral hypoglycemic drugs: Secondary | ICD-10-CM | POA: Insufficient documentation

## 2015-10-29 DIAGNOSIS — S39012A Strain of muscle, fascia and tendon of lower back, initial encounter: Secondary | ICD-10-CM

## 2015-10-29 DIAGNOSIS — S3992XA Unspecified injury of lower back, initial encounter: Secondary | ICD-10-CM | POA: Diagnosis present

## 2015-10-29 DIAGNOSIS — Y999 Unspecified external cause status: Secondary | ICD-10-CM | POA: Diagnosis not present

## 2015-10-29 MED ORDER — DICLOFENAC SODIUM 50 MG PO TBEC: 50.0000 mg | DELAYED_RELEASE_TABLET | Freq: Two times a day (BID) | ORAL | Status: DC

## 2015-10-29 MED ORDER — CYCLOBENZAPRINE HCL 10 MG PO TABS: 10.0000 mg | ORAL_TABLET | Freq: Two times a day (BID) | ORAL | Status: DC | PRN

## 2015-10-29 NOTE — Discharge Instructions (Signed)
Do not take the muscle relaxant if driving as it will make you sleepy. Follow up with your doctor or return as needed for worsening symptoms.
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
## 2015-11-25 ENCOUNTER — Telehealth: Payer: Self-pay | Admitting: Orthopaedic Surgery

## 2015-11-25 MED ORDER — HYDROCODONE-ACETAMINOPHEN 7.5-325 MG PO TABS: 1.0000 | ORAL_TABLET | ORAL | Status: DC | PRN

## 2015-11-25 NOTE — Telephone Encounter (Signed)
Patient called and requested a refill on Norco  7.5-325 mgs. Qty 120   Sig: Take 1 tablet by mouth every 4 (four) hours as needed for moderate pain (Must last 30 days.  Do not drive or operate machinery while taking this medicine °

## 2015-11-25 NOTE — Telephone Encounter (Signed)
Rx Done . 

## 2015-12-25 ENCOUNTER — Encounter: Payer: Self-pay | Admitting: Orthopaedic Surgery

## 2015-12-25 ENCOUNTER — Ambulatory Visit (INDEPENDENT_AMBULATORY_CARE_PROVIDER_SITE_OTHER): Payer: BLUE CROSS/BLUE SHIELD | Admitting: Orthopaedic Surgery

## 2015-12-25 VITALS — BP 102/63 | HR 72 | Temp 97.3°F | Ht 64.0 in | Wt 203.6 lb

## 2015-12-25 DIAGNOSIS — M25511 Pain in right shoulder: Secondary | ICD-10-CM | POA: Diagnosis not present

## 2015-12-25 MED ORDER — HYDROCODONE-ACETAMINOPHEN 7.5-325 MG PO TABS: 1.0000 | ORAL_TABLET | ORAL | Status: DC | PRN

## 2015-12-25 NOTE — Progress Notes (Signed)
CC:  My shoulder is sore  Her right shoulder still has some pain but she is better.  She has been doing her exercises.  She has no new trauma.  She has no paresthesias.  Examination of right Upper Extremity is done.  Inspection:   Overall:  Elbow non-tender without crepitus or defects, forearm non-tender without crepitus or defects, wrist non-tender without crepitus or defects, hand non-tender.    Shoulder: with glenohumeral joint tenderness, without effusion.   Upper arm: without swelling and tenderness   Range of motion:   Overall:  Full range of motion of the elbow, full range of motion of wrist and full range of motion in fingers.   Shoulder:  right  160 degrees forward flexion; 145 degrees abduction; 35 degrees internal rotation, 35 degrees external rotation, 20 degrees extension, 40 degrees adduction.   Stability:   Overall:  Shoulder, elbow and wrist stable   Strength and Tone:   Overall full shoulder muscles strength, full upper arm strength and normal upper arm bulk and tone. Encounter Diagnosis  Name Primary?  . Right shoulder pain Yes    PROCEDURE NOTE:  The patient request injection, verbal consent was obtained.  The right shoulder was prepped appropriately after time out was performed.   Sterile technique was observed and injection of 1 cc of Depo-Medrol 40 mg with several cc's of plain xylocaine. Anesthesia was provided by ethyl chloride and a 20-gauge needle was used to inject the shoulder area. A posterior approach was used.  The injection was tolerated well.  A band aid dressing was applied.  The patient was advised to apply ice later today and tomorrow to the injection sight as needed.  Return in four weeks.  Call if any problem.

## 2016-01-22 ENCOUNTER — Encounter: Payer: Self-pay | Admitting: Orthopaedic Surgery

## 2016-01-22 ENCOUNTER — Ambulatory Visit (INDEPENDENT_AMBULATORY_CARE_PROVIDER_SITE_OTHER): Payer: BLUE CROSS/BLUE SHIELD | Admitting: Orthopaedic Surgery

## 2016-01-22 VITALS — BP 115/68 | HR 74 | Temp 97.5°F | Ht 64.0 in | Wt 203.8 lb

## 2016-01-22 DIAGNOSIS — M25511 Pain in right shoulder: Secondary | ICD-10-CM | POA: Diagnosis not present

## 2016-01-22 MED ORDER — HYDROCODONE-ACETAMINOPHEN 7.5-325 MG PO TABS: 1.0000 | ORAL_TABLET | ORAL | Status: DC | PRN

## 2016-01-22 NOTE — Progress Notes (Signed)
Patient [id number] Brooke Sawyer, female DOB:03/21/1961, 55 y.o. [id number]  Chief Complaint  Patient presents with  . Follow-up    Right shoulder     HPI  ANTOINIQUE Sawyer is a 55 y.o. female who has chronic pain of the right shoulder.  She is much better after the injection last time.  She has less pain and near full motion today.  She has been doing her exercises at home.  She has no new trauma, no paresthesias, no swelling, no redness.  HPI  Body mass index is 34.96 kg/(m^2).  ROS  Review of Systems  Constitutional:       Patient has Diabetes Mellitus. Patient does not have hypertension. Patient does not have COPD or shortness of breath. Patient has BMI > 35. Patient does not have current smoking history.  HENT: Negative for congestion.   Respiratory: Negative for shortness of breath.   Cardiovascular: Negative for chest pain.  Endocrine: Positive for cold intolerance.  Musculoskeletal: Positive for myalgias and arthralgias.  All other systems reviewed and are negative.   Past Medical History  Diagnosis Date  . Diabetes mellitus without complication [facility])     Past Surgical History  Procedure Laterality Date  . Tubal ligation    . Dilation and currettage    . Colonoscopy N/A 09/27/2012    Procedure: COLONOSCOPY;  Surgeon: Rogene Houston, MD;  Location: [facility];  Service: Endoscopy;  Laterality: N/A;  1030    Family History  Problem Relation Age of Onset  . Colon cancer Neg Hx   . Diabetes Mother   . Heart disease Mother   . Diabetes Father     Social History Social History  Substance Use Topics  . Smoking status: Former Smoker -- 0.50 packs/day for 6 years    Types: Cigarettes  . Smokeless tobacco: None  . Alcohol Use: No    No Known Allergies  Current Outpatient Prescriptions  Medication Sig Dispense Refill  . aspirin EC 81 MG tablet Take 81 mg by mouth daily.    . canagliflozin (INVOKANA) 300 MG TABS tablet Take 300 mg by mouth daily before  breakfast.    . cholecalciferol (VITAMIN D) 1000 UNITS tablet Take 1,000 Units by mouth daily.    . Cyanocobalamin (B-12 PO) Take 1 tablet by mouth daily.    . cyclobenzaprine (FLEXERIL) 10 MG tablet Take 1 tablet (10 mg total) by mouth 2 (two) times daily as needed for muscle spasms. 20 tablet 0  . diclofenac (VOLTAREN) 50 MG EC tablet Take 1 tablet (50 mg total) by mouth 2 (two) times daily. 15 tablet 0  . glipiZIDE (GLUCOTROL) 5 MG tablet Take by mouth daily before breakfast.    . HYDROcodone-acetaminophen (NORCO) 7.5-325 MG tablet Take 1 tablet by mouth every 4 (four) hours as needed for moderate pain (Must last 30 days.  Do not drive or operate machinery while taking this medicine.). 120 tablet 0  . metFORMIN (GLUCOPHAGE) 500 MG tablet Take 500-1,000 mg by mouth 2 (two) times daily with a meal. Takes 1000 mg in the morning and 500 mg in the evening     No current facility-administered medications for this visit.     Physical Exam  Blood pressure 115/68, pulse 74, temperature 97.5 F (36.4 C), height 5\' 4"  (1.626 m), weight 203 lb 12.8 oz (92.443 kg).  Constitutional: overall normal hygiene, normal nutrition, well developed, normal grooming, normal body habitus. Assistive device:none  Musculoskeletal: gait and station Limp none, muscle tone and  strength are normal, no tremors or atrophy is present.  .  Neurological: coordination overall normal.  Deep tendon reflex/nerve stretch intact.  Sensation normal.  Cranial nerves II-XII intact.   Skin:   normal overall no scars, lesions, ulcers or rashes. No psoriasis.  Psychiatric: Alert and oriented x 3.  Recent memory intact, remote memory unclear.  Normal mood and affect. Well groomed.  Good eye contact.  Cardiovascular: overall no swelling, no varicosities, no edema bilaterally, normal temperatures of the legs and arms, no clubbing, cyanosis and good capillary refill.  Lymphatic: palpation is normal.  Examination of right Upper  Extremity is done.  Inspection:   Overall:  Elbow non-tender without crepitus or defects, forearm non-tender without crepitus or defects, wrist non-tender without crepitus or defects, hand non-tender.    Shoulder: with glenohumeral joint tenderness, without effusion.   Upper arm: without swelling and tenderness   Range of motion:   Overall:  Full range of motion of the elbow, full range of motion of wrist and full range of motion in fingers.   Shoulder:  right  180 degrees forward flexion; 165 degrees abduction; 35 degrees internal rotation, 35 degrees external rotation, 20 degrees extension, 40 degrees adduction.   Stability:   Overall:  Shoulder, elbow and wrist stable   Strength and Tone:   Overall full shoulder muscles strength, full upper arm strength and normal upper arm bulk and tone.   The patient has been educated about the nature of the problem(s) and counseled on treatment options.  The patient appeared to understand what I have discussed and is in agreement with it.  Encounter Diagnosis  Name Primary?  . Right shoulder pain Yes    PLAN Call if any problems.  Precautions discussed.  Continue current medications.   Return to clinic 1 month   Continue exercises.  Electronically Signed Sanjuana Kava, MD 6/22/20174:08 PM

## 2016-02-19 ENCOUNTER — Ambulatory Visit: Payer: BLUE CROSS/BLUE SHIELD | Admitting: Orthopaedic Surgery

## 2016-02-23 ENCOUNTER — Telehealth: Payer: Self-pay | Admitting: Orthopaedic Surgery

## 2016-02-23 NOTE — Telephone Encounter (Signed)
Hydrocodone-Acetaminophen 7.5/325mg Qty 120 Tablets °

## 2016-02-24 MED ORDER — HYDROCODONE-ACETAMINOPHEN 7.5-325 MG PO TABS: 1.0000 | ORAL_TABLET | ORAL | 0 refills | Status: DC | PRN

## 2016-02-26 ENCOUNTER — Ambulatory Visit: Payer: BLUE CROSS/BLUE SHIELD | Admitting: Orthopaedic Surgery

## 2016-03-02 ENCOUNTER — Encounter: Payer: Self-pay | Admitting: Orthopaedic Surgery

## 2016-03-02 ENCOUNTER — Ambulatory Visit (INDEPENDENT_AMBULATORY_CARE_PROVIDER_SITE_OTHER): Payer: BLUE CROSS/BLUE SHIELD | Admitting: Orthopaedic Surgery

## 2016-03-02 VITALS — BP 111/64 | HR 76 | Temp 97.7°F | Ht 64.0 in | Wt 210.6 lb

## 2016-03-02 DIAGNOSIS — M25511 Pain in right shoulder: Secondary | ICD-10-CM

## 2016-03-02 NOTE — Progress Notes (Signed)
Patient [id number] Brooke Sawyer, female DOB:1961/03/22, 55 y.o. [id number]  Chief Complaint  Patient presents with  . Follow-up    Right Shoulder    HPI  Brooke Sawyer is a 54 y.o. female who has chronic right shoulder pain.  She has been doing her exercises and taking her medicine.  Her shoulder is less painful and has more motion.  She has no new trauma or paresthesias. HPI  Body mass index is 36.15 kg/m.  ROS  Review of Systems  Constitutional:       Patient has Diabetes Mellitus. Patient does not have hypertension. Patient does not have COPD or shortness of breath. Patient has BMI > 35. Patient does not have current smoking history.  HENT: Negative for congestion.   Respiratory: Negative for shortness of breath.   Cardiovascular: Negative for chest pain.  Endocrine: Positive for cold intolerance.  Musculoskeletal: Positive for arthralgias and myalgias.  All other systems reviewed and are negative.   Past Medical History:  Diagnosis Date  . Diabetes mellitus without complication [facility])     Past Surgical History:  Procedure Laterality Date  . COLONOSCOPY N/A 09/27/2012   Procedure: COLONOSCOPY;  Surgeon: Rogene Houston, MD;  [facility];  Service: Endoscopy;  Laterality: N/A;  1030  . Dilation and Currettage    . TUBAL LIGATION      Family History  Problem Relation Age of Onset  . Diabetes Mother   . Heart disease Mother   . Diabetes Father   . Colon cancer Neg Hx     Social History Social History  Substance Use Topics  . Smoking status: Former Smoker    Packs/day: 0.50    Years: 6.00    Types: Cigarettes  . Smokeless tobacco: Never Used  . Alcohol use No    No Known Allergies  Current Outpatient Prescriptions  Medication Sig Dispense Refill  . aspirin EC 81 MG tablet Take 81 mg by mouth daily.    . canagliflozin (INVOKANA) 300 MG TABS tablet Take 300 mg by mouth daily before breakfast.    . cholecalciferol (VITAMIN D) 1000 UNITS tablet  Take 1,000 Units by mouth daily.    . Cyanocobalamin (B-12 PO) Take 1 tablet by mouth daily.    . cyclobenzaprine (FLEXERIL) 10 MG tablet Take 1 tablet (10 mg total) by mouth 2 (two) times daily as needed for muscle spasms. 20 tablet 0  . diclofenac (VOLTAREN) 50 MG EC tablet Take 1 tablet (50 mg total) by mouth 2 (two) times daily. 15 tablet 0  . glipiZIDE (GLUCOTROL) 5 MG tablet Take by mouth daily before breakfast.    . HYDROcodone-acetaminophen (NORCO) 7.5-325 MG tablet Take 1 tablet by mouth every 4 (four) hours as needed for moderate pain (Must last 30 days.  Do not drive or operate machinery while taking this medicine.). 120 tablet 0  . HYDROcodone-acetaminophen (NORCO) 7.5-325 MG tablet Take 1 tablet by mouth every 4 (four) hours as needed for moderate pain (Must last 30 days.  Do not drive or operate machinery while taking this medicine.). 120 tablet 0  . metFORMIN (GLUCOPHAGE) 500 MG tablet Take 500-1,000 mg by mouth 2 (two) times daily with a meal. Takes 1000 mg in the morning and 500 mg in the evening     No current facility-administered medications for this visit.      Physical Exam  Blood pressure 111/64, pulse 76, temperature 97.7 F (36.5 C), height 5\' 4"  (1.626 m), weight 210 lb 9.6 oz (  95.5 kg).  Constitutional: overall normal hygiene, normal nutrition, well developed, normal grooming, normal body habitus. Assistive device:none  Musculoskeletal: gait and station Limp none, muscle tone and strength are normal, no tremors or atrophy is present.  .  Neurological: coordination overall normal.  Deep tendon reflex/nerve stretch intact.  Sensation normal.  Cranial nerves II-XII intact.   Skin:   normal overall no scars, lesions, ulcers or rashes. No psoriasis.  Psychiatric: Alert and oriented x 3.  Recent memory intact, remote memory unclear.  Normal mood and affect. Well groomed.  Good eye contact.  Cardiovascular: overall no swelling, no varicosities, no edema bilaterally,  normal temperatures of the legs and arms, no clubbing, cyanosis and good capillary refill.  Lymphatic: palpation is normal.  Examination of right Upper Extremity is done.  Inspection:   Overall:  Elbow non-tender without crepitus or defects, forearm non-tender without crepitus or defects, wrist non-tender without crepitus or defects, hand non-tender.    Shoulder: with glenohumeral joint tenderness, without effusion.   Upper arm: without swelling and tenderness   Range of motion:   Overall:  Full range of motion of the elbow, full range of motion of wrist and full range of motion in fingers.   Shoulder:  right  180 degrees forward flexion; 165 degrees abduction; 35 degrees internal rotation, 35 degrees external rotation, 20 degrees extension, 40 degrees adduction.   Stability:   Overall:  Shoulder, elbow and wrist stable   Strength and Tone:   Overall full shoulder muscles strength, full upper arm strength and normal upper arm bulk and tone.   The patient has been educated about the nature of the problem(s) and counseled on treatment options.  The patient appeared to understand what I have discussed and is in agreement with it.  Encounter Diagnosis  Name Primary?  . Right shoulder pain Yes    PLAN Call if any problems.  Precautions discussed.  Continue current medications.   Return to clinic 6 weeks   Electronically Signed Sanjuana Kava, MD 8/1/20173:28 PM

## 2016-03-25 ENCOUNTER — Telehealth: Payer: Self-pay | Admitting: Orthopaedic Surgery

## 2016-03-25 MED ORDER — HYDROCODONE-ACETAMINOPHEN 7.5-325 MG PO TABS: 1.0000 | ORAL_TABLET | Freq: Four times a day (QID) | ORAL | 0 refills | Status: DC | PRN

## 2016-03-25 NOTE — Telephone Encounter (Signed)
Patient called and requested a refill on Hydrocodone/Acetaimonophen 7.5-325 mgs.  Qty  120  Sig: Take 1 tablet by mouth every 4 (four) hours as needed for moderate pain (Must last 30 days. Do not drive or operate machinery while taking this medicine.).

## 2016-04-13 ENCOUNTER — Encounter: Payer: Self-pay | Admitting: Orthopaedic Surgery

## 2016-04-13 ENCOUNTER — Ambulatory Visit (INDEPENDENT_AMBULATORY_CARE_PROVIDER_SITE_OTHER): Payer: BLUE CROSS/BLUE SHIELD | Admitting: Orthopaedic Surgery

## 2016-04-13 VITALS — BP 127/72 | HR 71 | Temp 97.5°F | Ht 64.0 in | Wt 212.0 lb

## 2016-04-13 DIAGNOSIS — M25511 Pain in right shoulder: Secondary | ICD-10-CM | POA: Diagnosis not present

## 2016-04-13 NOTE — Progress Notes (Signed)
CC:  My right shoulder hurts  She has had increased pain in the right shoulder.  She has no paresthesias.   Motion is decreased.  NV intact.    PROCEDURE NOTE:  The patient request injection, verbal consent was obtained.  The right shoulder was prepped appropriately after time out was performed.   Sterile technique was observed and injection of 1 cc of Depo-Medrol 40 mg with several cc's of plain xylocaine. Anesthesia was provided by ethyl chloride and a 20-gauge needle was used to inject the shoulder area. A posterior approach was used.  The injection was tolerated well.  A band aid dressing was applied.  The patient was advised to apply ice later today and tomorrow to the injection sight as needed.  Return in two weeks.  Call if any problem.  Precautions discussed.  Electronically Signed Sanjuana Kava, MD 9/12/20173:54 PM

## 2016-04-29 ENCOUNTER — Encounter: Payer: Self-pay | Admitting: Orthopaedic Surgery

## 2016-04-29 ENCOUNTER — Ambulatory Visit (INDEPENDENT_AMBULATORY_CARE_PROVIDER_SITE_OTHER): Payer: BLUE CROSS/BLUE SHIELD | Admitting: Orthopaedic Surgery

## 2016-04-29 VITALS — BP 133/76 | HR 78 | Temp 97.8°F | Ht 64.0 in | Wt 209.0 lb

## 2016-04-29 DIAGNOSIS — M25511 Pain in right shoulder: Secondary | ICD-10-CM

## 2016-04-29 MED ORDER — HYDROCODONE-ACETAMINOPHEN 7.5-325 MG PO TABS: 1.0000 | ORAL_TABLET | Freq: Four times a day (QID) | ORAL | 0 refills | Status: DC | PRN

## 2016-04-29 NOTE — Progress Notes (Signed)
Patient [id number] Brooke Sawyer, female DOB:1960-10-15, 55 y.o. [id number]  Chief Complaint  Patient presents with  . Shoulder Pain    HPI  Brooke Sawyer is a 55 y.o. female who has chronic pain of the right shoulder.  She is much improved after the injection last month.  She has no paresthesias. She has no new trauma. HPI  Body mass index is 35.87 kg/m.  ROS  Review of Systems  Past Medical History:  Diagnosis Date  . Diabetes mellitus without complication [facility])     Past Surgical History:  Procedure Laterality Date  . COLONOSCOPY N/A 09/27/2012   Procedure: COLONOSCOPY;  Surgeon: Rogene Houston, MD;  [facility];  Service: Endoscopy;  Laterality: N/A;  1030  . Dilation and Currettage    . TUBAL LIGATION      Family History  Problem Relation Age of Onset  . Diabetes Mother   . Heart disease Mother   . Diabetes Father   . Colon cancer Neg Hx     Social History Social History  Substance Use Topics  . Smoking status: Former Smoker    Packs/day: 0.50    Years: 6.00    Types: Cigarettes  . Smokeless tobacco: Never Used  . Alcohol use No    No Known Allergies  Current Outpatient Prescriptions  Medication Sig Dispense Refill  . aspirin EC 81 MG tablet Take 81 mg by mouth daily.    . canagliflozin (INVOKANA) 300 MG TABS tablet Take 300 mg by mouth daily before breakfast.    . cholecalciferol (VITAMIN D) 1000 UNITS tablet Take 1,000 Units by mouth daily.    . Cyanocobalamin (B-12 PO) Take 1 tablet by mouth daily.    . cyclobenzaprine (FLEXERIL) 10 MG tablet Take 1 tablet (10 mg total) by mouth 2 (two) times daily as needed for muscle spasms. 20 tablet 0  . diclofenac (VOLTAREN) 50 MG EC tablet Take 1 tablet (50 mg total) by mouth 2 (two) times daily. 15 tablet 0  . glipiZIDE (GLUCOTROL) 5 MG tablet Take by mouth daily before breakfast.    . HYDROcodone-acetaminophen (NORCO) 7.5-325 MG tablet Take 1 tablet by mouth every 6 (six) hours as needed for moderate  pain (Must last 30 days.Do not drive or operate machinery while taking this medicine.). 100 tablet 0  . metFORMIN (GLUCOPHAGE) 500 MG tablet Take 500-1,000 mg by mouth 2 (two) times daily with a meal. Takes 1000 mg in the morning and 500 mg in the evening     No current facility-administered medications for this visit.      Physical Exam  Blood pressure 133/76, pulse 78, temperature 97.8 F (36.6 C), height 5\' 4"  (1.626 m), weight 209 lb (94.8 kg).  Constitutional: overall normal hygiene, normal nutrition, well developed, normal grooming, normal body habitus. Assistive device:none  Musculoskeletal: gait and station Limp none, muscle tone and strength are normal, no tremors or atrophy is present.  .  Neurological: coordination overall normal.  Deep tendon reflex/nerve stretch intact.  Sensation normal.  Cranial nerves II-XII intact.   Skin:   Normal overall no scars, lesions, ulcers or rashes. No psoriasis.  Psychiatric: Alert and oriented x 3.  Recent memory intact, remote memory unclear.  Normal mood and affect. Well groomed.  Good eye contact.  Cardiovascular: overall no swelling, no varicosities, no edema bilaterally, normal temperatures of the legs and arms, no clubbing, cyanosis and good capillary refill.  Lymphatic: palpation is normal.  She has full motion of her  right shoulder today with pain in the extremes.  NV intact.  The patient has been educated about the nature of the problem(s) and counseled on treatment options.  The patient appeared to understand what I have discussed and is in agreement with it.  Encounter Diagnosis  Name Primary?  . Right shoulder pain Yes    PLAN Call if any problems.  Precautions discussed.  Continue current medications.   Return to clinic 6 weeks   Electronically Signed Sanjuana Kava, MD 9/28/20173:56 PM

## 2016-05-17 ENCOUNTER — Other Ambulatory Visit: Payer: Self-pay | Admitting: Obstetrics & Gynecology

## 2016-05-17 DIAGNOSIS — Z1231 Encounter for screening mammogram for malignant neoplasm of breast: Secondary | ICD-10-CM

## 2016-05-24 ENCOUNTER — Telehealth: Payer: Self-pay | Admitting: Orthopaedic Surgery

## 2016-05-25 MED ORDER — HYDROCODONE-ACETAMINOPHEN 7.5-325 MG PO TABS: 1.0000 | ORAL_TABLET | Freq: Four times a day (QID) | ORAL | 0 refills | Status: DC | PRN

## 2016-06-10 ENCOUNTER — Encounter: Payer: Self-pay | Admitting: Orthopaedic Surgery

## 2016-06-10 ENCOUNTER — Ambulatory Visit (INDEPENDENT_AMBULATORY_CARE_PROVIDER_SITE_OTHER): Payer: BLUE CROSS/BLUE SHIELD | Admitting: Orthopaedic Surgery

## 2016-06-10 VITALS — BP 135/76 | HR 77 | Ht 64.0 in | Wt 214.0 lb

## 2016-06-10 DIAGNOSIS — G8929 Other chronic pain: Secondary | ICD-10-CM | POA: Diagnosis not present

## 2016-06-10 DIAGNOSIS — M25511 Pain in right shoulder: Secondary | ICD-10-CM

## 2016-06-10 MED ORDER — HYDROCODONE-ACETAMINOPHEN 7.5-325 MG PO TABS: 1.0000 | ORAL_TABLET | Freq: Four times a day (QID) | ORAL | 0 refills | Status: DC | PRN

## 2016-06-10 NOTE — Progress Notes (Signed)
Patient [id number] Brooke Sawyer, female DOB:[birth year], 55 y.o. [id number]  Chief Complaint  Patient presents with  . Follow-up    chronic shoulder pain    HPI  Brooke MOLYNEAUX is a 55 y.o. female who has right shoulder pain.  She has no new acute events. She has no paresthesias or swelling.  She has more pain with the cooler weather and overhead use.  She is doing her exercises and taking her medicine. HPI  Body mass index is 36.73 kg/m.  ROS  Review of Systems  Constitutional:       Patient has Diabetes Mellitus. Patient does not have hypertension. Patient does not have COPD or shortness of breath. Patient has BMI > 35. Patient does not have current smoking history.  HENT: Negative for congestion.   Respiratory: Negative for shortness of breath.   Cardiovascular: Negative for chest pain.  Endocrine: Positive for cold intolerance.  Musculoskeletal: Positive for arthralgias and myalgias.  All other systems reviewed and are negative.   Past Medical History:  Diagnosis Date  . Diabetes mellitus without complication [facility])     Past Surgical History:  Procedure Laterality Date  . COLONOSCOPY N/A 09/27/2012   Procedure: COLONOSCOPY;  Surgeon: Rogene Houston, MD;  [facility];  Service: Endoscopy;  Laterality: N/A;  1030  . Dilation and Currettage    . TUBAL LIGATION      Family History  Problem Relation Age of Onset  . Diabetes Mother   . Heart disease Mother   . Diabetes Father   . Colon cancer Neg Hx     Social History Social History  Substance Use Topics  . Smoking status: Former Smoker    Packs/day: 0.50    Years: 6.00    Types: Cigarettes  . Smokeless tobacco: Never Used  . Alcohol use No    No Known Allergies  Current Outpatient Prescriptions  Medication Sig Dispense Refill  . aspirin EC 81 MG tablet Take 81 mg by mouth daily.    . canagliflozin (INVOKANA) 300 MG TABS tablet Take 300 mg by mouth daily before breakfast.    . cholecalciferol  (VITAMIN D) 1000 UNITS tablet Take 1,000 Units by mouth daily.    . Cyanocobalamin (B-12 PO) Take 1 tablet by mouth daily.    . cyclobenzaprine (FLEXERIL) 10 MG tablet Take 1 tablet (10 mg total) by mouth 2 (two) times daily as needed for muscle spasms. 20 tablet 0  . diclofenac (VOLTAREN) 50 MG EC tablet Take 1 tablet (50 mg total) by mouth 2 (two) times daily. 15 tablet 0  . glipiZIDE (GLUCOTROL) 5 MG tablet Take by mouth daily before breakfast.    . HYDROcodone-acetaminophen (NORCO) 7.5-325 MG tablet Take 1 tablet by mouth every 6 (six) hours as needed for moderate pain (Must last 30 days.Do not drive or operate machinery while taking this medicine.). 90 tablet 0  . metFORMIN (GLUCOPHAGE) 500 MG tablet Take 500-1,000 mg by mouth 2 (two) times daily with a meal. Takes 1000 mg in the morning and 500 mg in the evening     No current facility-administered medications for this visit.      Physical Exam  Blood pressure 135/76, pulse 77, height 5\' 4"  (1.626 m), weight 214 lb (97.1 kg).  Constitutional: overall normal hygiene, normal nutrition, well developed, normal grooming, normal body habitus. Assistive device:none  Musculoskeletal: gait and station Limp none, muscle tone and strength are normal, no tremors or atrophy is present.  .  Neurological: coordination  overall normal.  Deep tendon reflex/nerve stretch intact.  Sensation normal.  Cranial nerves II-XII intact.   Skin:   Normal overall no scars, lesions, ulcers or rashes. No psoriasis.  Psychiatric: Alert and oriented x 3.  Recent memory intact, remote memory unclear.  Normal mood and affect. Well groomed.  Good eye contact.  Cardiovascular: overall no swelling, no varicosities, no edema bilaterally, normal temperatures of the legs and arms, no clubbing, cyanosis and good capillary refill.  Lymphatic: palpation is normal.  Examination of right Upper Extremity is done.  Inspection:   Overall:  Elbow non-tender without crepitus  or defects, forearm non-tender without crepitus or defects, wrist non-tender without crepitus or defects, hand non-tender.    Shoulder: with glenohumeral joint tenderness, without effusion.   Upper arm: without swelling and tenderness   Range of motion:   Overall:  Full range of motion of the elbow, full range of motion of wrist and full range of motion in fingers.   Shoulder:  right  165 degrees forward flexion; 145 degrees abduction; 35 degrees internal rotation, 35 degrees external rotation, 15 degrees extension, 40 degrees adduction.   Stability:   Overall:  Shoulder, elbow and wrist stable   Strength and Tone:   Overall full shoulder muscles strength, full upper arm strength and normal upper arm bulk and tone.   The patient has been educated about the nature of the problem(s) and counseled on treatment options.  The patient appeared to understand what I have discussed and is in agreement with it.  Encounter Diagnosis  Name Primary?  . Chronic right shoulder pain Yes    PLAN Call if any problems.  Precautions discussed.  Continue current medications.   Return to clinic 1 month   Electronically Signed Sanjuana Kava, MD 11/9/20173:48 PM

## 2016-06-28 ENCOUNTER — Ambulatory Visit (HOSPITAL_COMMUNITY)
Admission: RE | Admit: 2016-06-28 | Discharge: 2016-06-28 | Disposition: A | Discharge status: Home / Self Care | Payer: BLUE CROSS/BLUE SHIELD | Source: Ambulatory Visit | Attending: Obstetrics & Gynecology | Admitting: Obstetrics & Gynecology

## 2016-06-28 DIAGNOSIS — Z1231 Encounter for screening mammogram for malignant neoplasm of breast: Secondary | ICD-10-CM | POA: Insufficient documentation

## 2016-07-08 ENCOUNTER — Ambulatory Visit (INDEPENDENT_AMBULATORY_CARE_PROVIDER_SITE_OTHER): Payer: BLUE CROSS/BLUE SHIELD | Admitting: Orthopaedic Surgery

## 2016-07-08 ENCOUNTER — Encounter: Payer: Self-pay | Admitting: Orthopaedic Surgery

## 2016-07-08 VITALS — BP 123/72 | HR 77 | Temp 97.2°F | Ht 64.0 in | Wt 213.0 lb

## 2016-07-08 DIAGNOSIS — M25511 Pain in right shoulder: Secondary | ICD-10-CM

## 2016-07-08 DIAGNOSIS — G8929 Other chronic pain: Secondary | ICD-10-CM | POA: Diagnosis not present

## 2016-07-08 NOTE — Progress Notes (Signed)
CC:  My shoulder hurts more  She has more pain of the right shoulder.  She has chronic pain here.  It is worse with the cold weather.  ROM of her right shoulder is full but tender in the extremes.  NV intact. She has no effusion.  Encounter Diagnosis  Name Primary?  . Chronic right shoulder pain Yes   PROCEDURE NOTE:  The patient request injection, verbal consent was obtained.  The right shoulder was prepped appropriately after time out was performed.   Sterile technique was observed and injection of 1 cc of Depo-Medrol 40 mg with several cc's of plain xylocaine. Anesthesia was provided by ethyl chloride and a 20-gauge needle was used to inject the shoulder area. A posterior approach was used.  The injection was tolerated well.  A band aid dressing was applied.  The patient was advised to apply ice later today and tomorrow to the injection sight as needed.  Return in one month.  Call if any problem.  Electronically Signed Sanjuana Kava, MD 12/7/20173:44 PM

## 2016-07-18 ENCOUNTER — Telehealth: Payer: Self-pay | Admitting: Orthopaedic Surgery

## 2016-07-20 MED ORDER — HYDROCODONE-ACETAMINOPHEN 7.5-325 MG PO TABS: 1.0000 | ORAL_TABLET | Freq: Four times a day (QID) | ORAL | 0 refills | Status: DC | PRN

## 2016-08-10 ENCOUNTER — Encounter: Payer: Self-pay | Admitting: Orthopaedic Surgery

## 2016-08-10 ENCOUNTER — Ambulatory Visit (INDEPENDENT_AMBULATORY_CARE_PROVIDER_SITE_OTHER): Payer: BLUE CROSS/BLUE SHIELD | Admitting: Orthopaedic Surgery

## 2016-08-10 VITALS — BP 127/62 | HR 78 | Temp 97.5°F | Wt 212.0 lb

## 2016-08-10 DIAGNOSIS — G8929 Other chronic pain: Secondary | ICD-10-CM | POA: Diagnosis not present

## 2016-08-10 DIAGNOSIS — M25511 Pain in right shoulder: Secondary | ICD-10-CM

## 2016-08-10 NOTE — Progress Notes (Signed)
Patient Brooke Sawyer, female DOB:[birth year], 56 y.o. [id number]  Chief Complaint  Patient presents with  . Follow-up    CHRONIC SHOULDER PAIN    HPI  Brooke Sawyer is a 56 y.o. female who has chronic pain of the right shoulder.  She is doing heat and ice to the shoulder and it helps. She has no acute injury.  She has no paresthesias.  She is doing her exercises.   HPI  Body mass index is 36.39 kg/m.  ROS  Review of Systems  Constitutional:       Patient has Diabetes Mellitus. Patient does not have hypertension. Patient does not have COPD or shortness of breath. Patient has BMI > 35. Patient does not have current smoking history.  HENT: Negative for congestion.   Respiratory: Negative for shortness of breath.   Cardiovascular: Negative for chest pain.  Endocrine: Positive for cold intolerance.  Musculoskeletal: Positive for arthralgias and myalgias.  All other systems reviewed and are negative.   Past Medical History:  Diagnosis Date  . Diabetes mellitus without complication [facility])     Past Surgical History:  Procedure Laterality Date  . COLONOSCOPY N/A 09/27/2012   Procedure: COLONOSCOPY;  Surgeon: Rogene Houston, MD;  [facility];  Service: Endoscopy;  Laterality: N/A;  1030  . Dilation and Currettage    . TUBAL LIGATION      Family History  Problem Relation Age of Onset  . Diabetes Mother   . Heart disease Mother   . Diabetes Father   . Colon cancer Neg Hx     Social History Social History  Substance Use Topics  . Smoking status: Former Smoker    Packs/day: 0.50    Years: 6.00    Types: Cigarettes  . Smokeless tobacco: Never Used  . Alcohol use No    No Known Allergies  Current Outpatient Prescriptions  Medication Sig Dispense Refill  . aspirin EC 81 MG tablet Take 81 mg by mouth daily.    . canagliflozin (INVOKANA) 300 MG TABS tablet Take 300 mg by mouth daily before breakfast.    . cholecalciferol (VITAMIN D) 1000 UNITS tablet  Take 1,000 Units by mouth daily.    . Cyanocobalamin (B-12 PO) Take 1 tablet by mouth daily.    . cyclobenzaprine (FLEXERIL) 10 MG tablet Take 1 tablet (10 mg total) by mouth 2 (two) times daily as needed for muscle spasms. 20 tablet 0  . diclofenac (VOLTAREN) 50 MG EC tablet Take 1 tablet (50 mg total) by mouth 2 (two) times daily. 15 tablet 0  . glipiZIDE (GLUCOTROL) 5 MG tablet Take by mouth daily before breakfast.    . HYDROcodone-acetaminophen (NORCO) 7.5-325 MG tablet Take 1 tablet by mouth every 6 (six) hours as needed for moderate pain (Must last 30 days.Do not drive or operate machinery while taking this medicine.). 80 tablet 0  . metFORMIN (GLUCOPHAGE) 500 MG tablet Take 500-1,000 mg by mouth 2 (two) times daily with a meal. Takes 1000 mg in the morning and 500 mg in the evening     No current facility-administered medications for this visit.      Physical Exam  Blood pressure 127/62, pulse 78, temperature 97.5 F (36.4 C), weight 212 lb (96.2 kg).  Constitutional: overall normal hygiene, normal nutrition, well developed, normal grooming, normal body habitus. Assistive device:none  Musculoskeletal: gait and station Limp none, muscle tone and strength are normal, no tremors or atrophy is present.  .  Neurological: coordination overall  normal.  Deep tendon reflex/nerve stretch intact.  Sensation normal.  Cranial nerves II-XII intact.   Skin:   Normal overall no scars, lesions, ulcers or rashes. No psoriasis.  Psychiatric: Alert and oriented x 3.  Recent memory intact, remote memory unclear.  Normal mood and affect. Well groomed.  Good eye contact.  Cardiovascular: overall no swelling, no varicosities, no edema bilaterally, normal temperatures of the legs and arms, no clubbing, cyanosis and good capillary refill.  Lymphatic: palpation is normal.  Examination of right Upper Extremity is done.  Inspection:   Overall:  Elbow non-tender without crepitus or defects, forearm  non-tender without crepitus or defects, wrist non-tender without crepitus or defects, hand non-tender.    Shoulder: with glenohumeral joint tenderness, without effusion.   Upper arm: without swelling and tenderness   Range of motion:   Overall:  Full range of motion of the elbow, full range of motion of wrist and full range of motion in fingers.   Shoulder:  right  165 degrees forward flexion; 145 degrees abduction; 35 degrees internal rotation, 35 degrees external rotation, 15 degrees extension, 40 degrees adduction.   Stability:   Overall:  Shoulder, elbow and wrist stable   Strength and Tone:   Overall full shoulder muscles strength, full upper arm strength and normal upper arm bulk and tone.   The patient has been educated about the nature of the problem(s) and counseled on treatment options.  The patient appeared to understand what I have discussed and is in agreement with it.  Encounter Diagnosis  Name Primary?  . Chronic right shoulder pain Yes    PLAN Call if any problems.  Precautions discussed.  Continue current medications.   Return to clinic PRN   Electronically Signed Sanjuana Kava, MD 1/9/20184:03 PM

## 2016-08-23 ENCOUNTER — Other Ambulatory Visit: Payer: Self-pay | Admitting: Orthopaedic Surgery

## 2016-08-30 ENCOUNTER — Encounter: Payer: Self-pay | Admitting: Obstetrics & Gynecology

## 2016-08-30 ENCOUNTER — Other Ambulatory Visit (HOSPITAL_COMMUNITY)
Admission: RE | Admit: 2016-08-30 | Discharge: 2016-08-30 | Disposition: A | Discharge status: Home / Self Care | Payer: BLUE CROSS/BLUE SHIELD | Source: Ambulatory Visit | Attending: Obstetrics & Gynecology | Admitting: Obstetrics & Gynecology

## 2016-08-30 ENCOUNTER — Ambulatory Visit (INDEPENDENT_AMBULATORY_CARE_PROVIDER_SITE_OTHER): Payer: BLUE CROSS/BLUE SHIELD | Admitting: Obstetrics & Gynecology

## 2016-08-30 VITALS — BP 150/80 | HR 76 | Ht 64.0 in | Wt 219.0 lb

## 2016-08-30 DIAGNOSIS — Z01419 Encounter for gynecological examination (general) (routine) without abnormal findings: Secondary | ICD-10-CM | POA: Insufficient documentation

## 2016-08-30 DIAGNOSIS — Z01411 Encounter for gynecological examination (general) (routine) with abnormal findings: Secondary | ICD-10-CM | POA: Diagnosis not present

## 2016-08-30 DIAGNOSIS — N926 Irregular menstruation, unspecified: Secondary | ICD-10-CM | POA: Diagnosis not present

## 2016-08-30 DIAGNOSIS — Z1211 Encounter for screening for malignant neoplasm of colon: Secondary | ICD-10-CM | POA: Diagnosis not present

## 2016-08-30 DIAGNOSIS — N950 Postmenopausal bleeding: Secondary | ICD-10-CM

## 2016-08-30 DIAGNOSIS — Z1212 Encounter for screening for malignant neoplasm of rectum: Secondary | ICD-10-CM

## 2016-08-30 NOTE — Progress Notes (Signed)
Subjective:     Brooke Sawyer is a 56 y.o. female here for a routine exam.  Patient's last menstrual period was 05/25/2016. No obstetric history on file. Birth Control Method:  BTL Menstrual Calendar(currently): irregular 2 periods in 2 years  Current complaints: period in october.   Current acute medical issues:  none   Recent Gynecologic History Patient's last menstrual period was 05/25/2016. Last Pap: 2017  normal Last mammogram: 06/2016,  normal  Past Medical History:  Diagnosis Date  . Diabetes mellitus without complication [facility])     Past Surgical History:  Procedure Laterality Date  . COLONOSCOPY N/A 09/27/2012   Procedure: COLONOSCOPY;  Surgeon: Rogene Houston, MD;  [facility];  Service: Endoscopy;  Laterality: N/A;  1030  . Dilation and Currettage    . TUBAL LIGATION      OB History    No data available      Social History   Social History  . Marital status: Married    Spouse name: N/A  . Number of children: N/A  . Years of education: N/A   Social History Main Topics  . Smoking status: Former Smoker    Packs/day: 0.50    Years: 6.00    Types: Cigarettes  . Smokeless tobacco: Never Used  . Alcohol use No  . Drug use: No  . Sexual activity: Not Asked   Other Topics Concern  . None   Social History Narrative  . None    Family History  Problem Relation Age of Onset  . Diabetes Mother   . Heart disease Mother   . Diabetes Father   . Colon cancer Neg Hx      Current Outpatient Prescriptions:  .  aspirin EC 81 MG tablet, Take 81 mg by mouth daily., Disp: , Rfl:  .  canagliflozin (INVOKANA) 300 MG TABS tablet, Take 300 mg by mouth daily before breakfast., Disp: , Rfl:  .  cholecalciferol (VITAMIN D) 1000 UNITS tablet, Take 1,000 Units by mouth daily., Disp: , Rfl:  .  Cyanocobalamin (B-12 PO), Take 1 tablet by mouth daily., Disp: , Rfl:  .  glipiZIDE (GLUCOTROL) 5 MG tablet, Take by mouth daily before breakfast., Disp: , Rfl:  .   HYDROcodone-acetaminophen (NORCO) 7.5-325 MG tablet, Take 1 tablet by mouth every 6 (six) hours as needed for moderate pain (Must last 30 days.Do not drive or operate machinery while taking this medicine.)., Disp: 80 tablet, Rfl: 0 .  metFORMIN (GLUCOPHAGE) 500 MG tablet, Take 500-1,000 mg by mouth 2 (two) times daily with a meal. Takes 1000 mg in the morning and 500 mg in the evening, Disp: , Rfl:   Review of Systems  Review of Systems  Constitutional: Negative for fever, chills, weight loss, malaise/fatigue and diaphoresis.  HENT: Negative for hearing loss, ear pain, nosebleeds, congestion, sore throat, neck pain, tinnitus and ear discharge.   Eyes: Negative for blurred vision, double vision, photophobia, pain, discharge and redness.  Respiratory: Negative for cough, hemoptysis, sputum production, shortness of breath, wheezing and stridor.   Cardiovascular: Negative for chest pain, palpitations, orthopnea, claudication, leg swelling and PND.  Gastrointestinal: negative for abdominal pain. Negative for heartburn, nausea, vomiting, diarrhea, constipation, blood in stool and melena.  Genitourinary: Negative for dysuria, urgency, frequency, hematuria and flank pain.  Musculoskeletal: Negative for myalgias, back pain, joint pain and falls.  Skin: Negative for itching and rash.  Neurological: Negative for dizziness, tingling, tremors, sensory change, speech change, focal weakness, seizures, loss of  consciousness, weakness and headaches.  Endo/Heme/Allergies: Negative for environmental allergies and polydipsia. Does not bruise/bleed easily.  Psychiatric/Behavioral: Negative for depression, suicidal ideas, hallucinations, memory loss and substance abuse. The patient is not nervous/anxious and does not have insomnia.        Objective:  Blood pressure (!) 150/80, pulse 76, height 5\' 4"  (1.626 m), weight 219 lb (99.3 kg), last menstrual period 05/25/2016.   Physical Exam  Vitals  reviewed. Constitutional: She is oriented to person, place, and time. She appears well-developed and well-nourished.  HENT:  Head: Normocephalic and atraumatic.        Right Ear: External ear normal.  Left Ear: External ear normal.  Nose: Nose normal.  Mouth/Throat: Oropharynx is clear and moist.  Eyes: Conjunctivae and EOM are normal. Pupils are equal, round, and reactive to light. Right eye exhibits no discharge. Left eye exhibits no discharge. No scleral icterus.  Neck: Normal range of motion. Neck supple. No tracheal deviation present. No thyromegaly present.  Cardiovascular: Normal rate, regular rhythm, normal heart sounds and intact distal pulses.  Exam reveals no gallop and no friction rub.   No murmur heard. Respiratory: Effort normal and breath sounds normal. No respiratory distress. She has no wheezes. She has no rales. She exhibits no tenderness.  GI: Soft. Bowel sounds are normal. She exhibits no distension and no mass. There is no tenderness. There is no rebound and no guarding.  Genitourinary:  Breasts no masses skin changes or nipple changes bilaterally      Vulva is normal without lesions Vagina is pink moist without discharge Cervix normal in appearance and pap is done Uterus is normal size shape and contour Adnexa is negative with normal sized ovaries  {Rectal    hemoccult negative, normal tone, no masses  Musculoskeletal: Normal range of motion. She exhibits no edema and no tenderness.  Neurological: She is alert and oriented to person, place, and time. She has normal reflexes. She displays normal reflexes. No cranial nerve deficit. She exhibits normal muscle tone. Coordination normal.  Skin: Skin is warm and dry. No rash noted. No erythema. No pallor.  Psychiatric: She has a normal mood and affect. Her behavior is normal. Judgment and thought content normal.       Medications Ordered at today's visit: No orders of the defined types were placed in this  encounter.   Other orders placed at today's visit: Orders Placed This Encounter  Procedures  . US Pelvis Complete  . US Transvaginal Non-OB      Assessment:    Healthy female exam.    Plan:    Mammogram ordered. Follow up in: 2 weeks.  Gyn sonogram     Return in about 2 weeks (around 09/13/2016) for GYN sono, Follow up, with Dr Elonda Husky.

## 2016-09-01 ENCOUNTER — Telehealth: Payer: Self-pay | Admitting: Orthopaedic Surgery

## 2016-09-01 NOTE — Telephone Encounter (Signed)
Patient would like to pick up her prescription on Friday, her internet was not working properly.  Hydrocodone-Acetaminophen  7.5/325mg    Qty 80 Tablets

## 2016-09-02 LAB — CYTOLOGY - PAP: DIAGNOSIS: NEGATIVE

## 2016-09-02 NOTE — Telephone Encounter (Signed)
No.  She got hydrocodone syrup from another doctor after getting my Rx last month.

## 2016-09-06 ENCOUNTER — Telehealth: Payer: Self-pay | Admitting: Orthopaedic Surgery

## 2016-09-07 MED ORDER — HYDROCODONE-ACETAMINOPHEN 7.5-325 MG PO TABS: 1.0000 | ORAL_TABLET | Freq: Four times a day (QID) | ORAL | 0 refills | Status: DC | PRN

## 2016-09-10 ENCOUNTER — Other Ambulatory Visit: Payer: Self-pay | Admitting: Obstetrics & Gynecology

## 2016-09-10 DIAGNOSIS — N950 Postmenopausal bleeding: Secondary | ICD-10-CM

## 2016-09-13 ENCOUNTER — Ambulatory Visit: Payer: BLUE CROSS/BLUE SHIELD | Admitting: Obstetrics & Gynecology

## 2016-09-13 ENCOUNTER — Other Ambulatory Visit: Payer: BLUE CROSS/BLUE SHIELD

## 2016-09-20 ENCOUNTER — Ambulatory Visit: Payer: BLUE CROSS/BLUE SHIELD | Admitting: Obstetrics & Gynecology

## 2016-09-20 ENCOUNTER — Other Ambulatory Visit: Payer: BLUE CROSS/BLUE SHIELD

## 2016-09-22 ENCOUNTER — Telehealth: Payer: Self-pay | Admitting: Obstetrics & Gynecology

## 2016-09-22 ENCOUNTER — Other Ambulatory Visit: Payer: Self-pay | Admitting: Obstetrics & Gynecology

## 2016-09-22 MED ORDER — TERCONAZOLE 0.4 % VA CREA: 1.0000 | TOPICAL_CREAM | Freq: Every day | VAGINAL | 0 refills | Status: DC

## 2016-09-22 NOTE — Telephone Encounter (Signed)
Pt states she has a slight d/c but is very itchy "up inside" and think she has a yeast infection, she would like RX for diflucan sent to [facility] in [place].  Informed pt Dr. Elonda Husky is out of the office today but will get the message tomorrow.  Pt verbalized understanding.

## 2016-09-29 ENCOUNTER — Telehealth: Payer: Self-pay | Admitting: Orthopaedic Surgery

## 2016-09-30 MED ORDER — HYDROCODONE-ACETAMINOPHEN 7.5-325 MG PO TABS: 1.0000 | ORAL_TABLET | Freq: Four times a day (QID) | ORAL | 0 refills | Status: DC | PRN

## 2016-10-14 ENCOUNTER — Other Ambulatory Visit: Payer: BLUE CROSS/BLUE SHIELD

## 2016-10-19 ENCOUNTER — Other Ambulatory Visit: Payer: Self-pay | Admitting: Obstetrics & Gynecology

## 2016-10-19 ENCOUNTER — Ambulatory Visit (INDEPENDENT_AMBULATORY_CARE_PROVIDER_SITE_OTHER): Payer: BLUE CROSS/BLUE SHIELD

## 2016-10-19 ENCOUNTER — Encounter: Payer: Self-pay | Admitting: Obstetrics & Gynecology

## 2016-10-19 ENCOUNTER — Ambulatory Visit (INDEPENDENT_AMBULATORY_CARE_PROVIDER_SITE_OTHER): Payer: BLUE CROSS/BLUE SHIELD | Admitting: Obstetrics & Gynecology

## 2016-10-19 VITALS — BP 138/82 | HR 94 | Wt 219.0 lb

## 2016-10-19 DIAGNOSIS — Z01419 Encounter for gynecological examination (general) (routine) without abnormal findings: Secondary | ICD-10-CM

## 2016-10-19 DIAGNOSIS — R938 Abnormal findings on diagnostic imaging of other specified body structures: Secondary | ICD-10-CM

## 2016-10-19 DIAGNOSIS — N950 Postmenopausal bleeding: Secondary | ICD-10-CM

## 2016-10-19 DIAGNOSIS — R9389 Abnormal findings on diagnostic imaging of other specified body structures: Secondary | ICD-10-CM

## 2016-10-19 NOTE — Progress Notes (Signed)
Follow up appointment for results  Chief Complaint  Patient presents with  . Follow-up    gyn u/s    Blood pressure 138/82, pulse 94, weight 219 lb (99.3 kg).  US Transvaginal Non-ob  Result Date: 10/19/2016 GYNECOLOGIC SONOGRAM Brooke Sawyer is a 56 y.o. for a pelvic sonogram for postmenopausal bleeding. Uterus                      10.1 x 6 x 5 cm, heterogeneous retroverted uterus w/mult fibroids Endometrium          6.5 mm, symmetrical, endometrium is distorted by post/lus fibroid Right ovary             2.6 x 1.9 x 1.8 cm, wnl Left ovary                2.9 x 1.3 x 2.4 cm, wnl Largest fibroids(#1) anterior intramural fibroid 1.9 x 1.9 x 1.8 cm,(#2) post/lus submucosal fibroid 1.7 x 1.4 x 2.2 cm Technician Comments: PELVIS TA/TV: heterogeneous retroverted uterus w/mult fibroids,(#1) anterior intramural fibroid 1.9 x 1.9 x 1.8 cm,(#2) post/lus submucosal fibroid 1.7 x 1.4 x 2.2 cm,thickened endometrium 6.5 mm,endometrium is distorted by post/lus fibroid,no free fluid,ovaries appear mobile [employer] 10/19/2016 4:05 PM Clinical Impression and recommendations: I have reviewed the sonogram results above, combined with the patient's current clinical course, below are my impressions and any appropriate recommendations for management based on the sonographic findings. Uterus with 2 small myomas, one is distorting the lower uterine segment Both ovaries are normal Endometrium is thickened for a menopausal woman Brooke Sawyer 10/19/2016 4:46 PM   US Pelvis Complete  Result Date: 10/19/2016 GYNECOLOGIC SONOGRAM Brooke Sawyer is a 56 y.o. for a pelvic sonogram for postmenopausal bleeding. Uterus                      10.1 x 6 x 5 cm, heterogeneous retroverted uterus w/mult fibroids Endometrium          6.5 mm, symmetrical, endometrium is distorted by post/lus fibroid Right ovary             2.6 x 1.9 x 1.8 cm, wnl Left ovary                2.9 x 1.3 x 2.4 cm, wnl Largest fibroids(#1) anterior intramural fibroid  1.9 x 1.9 x 1.8 cm,(#2) post/lus submucosal fibroid 1.7 x 1.4 x 2.2 cm Technician Comments: PELVIS TA/TV: heterogeneous retroverted uterus w/mult fibroids,(#1) anterior intramural fibroid 1.9 x 1.9 x 1.8 cm,(#2) post/lus submucosal fibroid 1.7 x 1.4 x 2.2 cm,thickened endometrium 6.5 mm,endometrium is distorted by post/lus fibroid,no free fluid,ovaries appear mobile [employer] 10/19/2016 4:05 PM Clinical Impression and recommendations: I have reviewed the sonogram results above, combined with the patient's current clinical course, below are my impressions and any appropriate recommendations for management based on the sonographic findings. Uterus with 2 small myomas, one is distorting the lower uterine segment Both ovaries are normal Endometrium is thickened for a menopausal woman Brooke Sawyer 10/19/2016 4:46 PM      MEDS ordered this encounter: No orders of the defined types were placed in this encounter.   Orders for this encounter: Orders Placed This Encounter  Procedures  . Endometrial biopsy   Endometrial Biopsy Procedure Note  Pre-operative Diagnosis: thickened endometrium and postmenopausal bleeding  Post-operative Diagnosis: same  Indications: same  Procedure Details   Urine pregnancy test was not done.  The risks (  including infection, bleeding, pain, and uterine perforation) and benefits of the procedure were explained to the patient and Written informed consent was obtained.  Antibiotic prophylaxis against endocarditis was not indicated.   The patient was placed in the dorsal lithotomy position.  Bimanual exam showed the uterus to be in the anteroflexed position.  A Graves' speculum inserted in the vagina, and the cervix prepped with povidone iodine.  Endocervical curettage with a Kevorkian curette was not performed.   A sharp tenaculum was applied to the anterior lip of the cervix for stabilization.  A sterile uterine sound was used to sound the uterus to a depth of 6.5cm.  A  Pipelle endometrial aspirator was used to sample the endometrium.  Sample was sent for pathologic examination.  Condition: Stable  Complications: None  Plan:  The patient was advised to call for any fever or for prolonged or severe pain or bleeding. She was advised to use OTC analgesics as needed for mild to moderate pain. She was advised to avoid vaginal intercourse for 48 hours or until the bleeding has completely stopped.  Attending Physician Documentation: I was present for or performed the following: endometrial biopsy  Impression: Post-menopausal bleeding  Thickened endometrium - Plan: Endometrial biopsy    Plan:   Follow Up: Return in about 2 weeks (around 11/02/2016) for Follow up, with Dr Elonda Husky.          All questions were answered.  Past Medical History:  Diagnosis Date  . Diabetes mellitus without complication [facility])     Past Surgical History:  Procedure Laterality Date  . COLONOSCOPY N/A 09/27/2012   Procedure: COLONOSCOPY;  Surgeon: Rogene Houston, MD;  [facility];  Service: Endoscopy;  Laterality: N/A;  1030  . Dilation and Currettage    . TUBAL LIGATION      OB History    No data available      No Known Allergies  Social History   Social History  . Marital status: Married    Spouse name: N/A  . Number of children: N/A  . Years of education: N/A   Social History Main Topics  . Smoking status: Former Smoker    Packs/day: 0.50    Years: 6.00    Types: Cigarettes  . Smokeless tobacco: Never Used  . Alcohol use No  . Drug use: No  . Sexual activity: Yes    Birth control/ protection: None   Other Topics Concern  . None   Social History Narrative  . None    Family History  Problem Relation Age of Onset  . Diabetes Mother   . Heart disease Mother   . Diabetes Father   . Colon cancer Neg Hx

## 2016-10-19 NOTE — Progress Notes (Signed)
PELVIS TA/TV: heterogeneous retroverted uterus w/mult fibroids,(#1) anterior intramural fibroid 1.9 x 1.9 x 1.8 cm,(#2) post/lus submucosal fibroid 1.7 x 1.4 x 2.2 cm,thickened endometrium 6.5 mm,endometrium is distorted by post/us fibroid,no free fluid,ovaries appear mobile

## 2016-11-01 ENCOUNTER — Other Ambulatory Visit: Payer: Self-pay | Admitting: Orthopaedic Surgery

## 2016-11-02 ENCOUNTER — Ambulatory Visit (INDEPENDENT_AMBULATORY_CARE_PROVIDER_SITE_OTHER): Payer: BLUE CROSS/BLUE SHIELD | Admitting: Obstetrics & Gynecology

## 2016-11-02 ENCOUNTER — Encounter: Payer: Self-pay | Admitting: Obstetrics & Gynecology

## 2016-11-02 VITALS — BP 120/72 | HR 79 | Wt 222.0 lb

## 2016-11-02 DIAGNOSIS — R938 Abnormal findings on diagnostic imaging of other specified body structures: Secondary | ICD-10-CM

## 2016-11-02 DIAGNOSIS — R9389 Abnormal findings on diagnostic imaging of other specified body structures: Secondary | ICD-10-CM

## 2016-11-02 MED ORDER — HYDROCODONE-ACETAMINOPHEN 7.5-325 MG PO TABS: 1.0000 | ORAL_TABLET | Freq: Four times a day (QID) | ORAL | 0 refills | Status: DC | PRN

## 2016-11-02 NOTE — Progress Notes (Signed)
Follow up appointment for results  Chief Complaint  Patient presents with  . Follow-up    Blood pressure 120/72, pulse 79, weight 222 lb (100.7 kg).  Endometrial biopsy:benign endometrial tissue, no atypia Discussed results and answered questions   MEDS ordered this encounter: No orders of the defined types were placed in this encounter.   Orders for this encounter: No orders of the defined types were placed in this encounter.   Impression: 1. Thickened endometrium    Plan:   Follow Up: Return in about 1 year (around 11/02/2017) for yearly.       Face to face time:  10 minutes  Greater than 50% of the visit time was spent in counseling and coordination of care with the patient.  The summary and outline of the counseling and care coordination is summarized in the note above.   All questions were answered.  Past Medical History:  Diagnosis Date  . Diabetes mellitus without complication [facility])     Past Surgical History:  Procedure Laterality Date  . COLONOSCOPY N/A 09/27/2012   Procedure: COLONOSCOPY;  Surgeon: Rogene Houston, MD;  [facility];  Service: Endoscopy;  Laterality: N/A;  1030  . Dilation and Currettage    . TUBAL LIGATION      OB History    No data available      No Known Allergies  Social History   Social History  . Marital status: Married    Spouse name: N/A  . Number of children: N/A  . Years of education: N/A   Social History Main Topics  . Smoking status: Former Smoker    Packs/day: 0.50    Years: 6.00    Types: Cigarettes  . Smokeless tobacco: Never Used  . Alcohol use No  . Drug use: No  . Sexual activity: Yes    Birth control/ protection: None   Other Topics Concern  . None   Social History Narrative  . None    Family History  Problem Relation Age of Onset  . Diabetes Mother   . Heart disease Mother   . Diabetes Father   . Colon cancer Neg Hx

## 2016-11-30 ENCOUNTER — Other Ambulatory Visit: Payer: Self-pay | Admitting: Orthopaedic Surgery

## 2016-12-01 ENCOUNTER — Other Ambulatory Visit: Payer: Self-pay | Admitting: *Deleted

## 2016-12-01 MED ORDER — HYDROCODONE-ACETAMINOPHEN 7.5-325 MG PO TABS: 1.0000 | ORAL_TABLET | Freq: Four times a day (QID) | ORAL | 0 refills | Status: DC | PRN

## 2016-12-30 ENCOUNTER — Telehealth: Payer: Self-pay | Admitting: Orthopedic Surgery

## 2017-01-03 MED ORDER — HYDROCODONE-ACETAMINOPHEN 7.5-325 MG PO TABS: 1.0000 | ORAL_TABLET | Freq: Four times a day (QID) | ORAL | 0 refills | Status: DC | PRN

## 2017-01-27 ENCOUNTER — Telehealth: Payer: Self-pay | Admitting: Orthopaedic Surgery

## 2017-01-27 MED ORDER — HYDROCODONE-ACETAMINOPHEN 7.5-325 MG PO TABS: 1.0000 | ORAL_TABLET | Freq: Four times a day (QID) | ORAL | 0 refills | Status: DC | PRN

## 2017-01-27 NOTE — Telephone Encounter (Signed)
Patient requests refill on Hydrocodone/Acetaminophen 7.5-325  Mgs.   Qty  50  Sig: Take 1 tablet by mouth every 6 (six) hours as needed for moderate pain (Must last 30 days.Do not drive or operate machinery while taking this medicine.).

## 2017-03-02 ENCOUNTER — Telehealth: Payer: Self-pay | Admitting: Orthopaedic Surgery

## 2017-03-03 MED ORDER — HYDROCODONE-ACETAMINOPHEN 7.5-325 MG PO TABS: 1.0000 | ORAL_TABLET | Freq: Four times a day (QID) | ORAL | 0 refills | Status: DC | PRN

## 2017-04-04 ENCOUNTER — Telehealth: Payer: Self-pay | Admitting: Orthopaedic Surgery

## 2017-04-05 MED ORDER — HYDROCODONE-ACETAMINOPHEN 7.5-325 MG PO TABS: 1.0000 | ORAL_TABLET | Freq: Four times a day (QID) | ORAL | 0 refills | Status: DC | PRN

## 2017-05-02 ENCOUNTER — Telehealth: Payer: Self-pay | Admitting: Orthopaedic Surgery

## 2017-05-05 MED ORDER — HYDROCODONE-ACETAMINOPHEN 7.5-325 MG PO TABS: 1.0000 | ORAL_TABLET | Freq: Four times a day (QID) | ORAL | 0 refills | Status: DC | PRN

## 2017-05-19 ENCOUNTER — Other Ambulatory Visit: Payer: Self-pay | Admitting: Obstetrics & Gynecology

## 2017-05-19 DIAGNOSIS — Z1231 Encounter for screening mammogram for malignant neoplasm of breast: Secondary | ICD-10-CM

## 2017-06-02 ENCOUNTER — Other Ambulatory Visit: Payer: Self-pay | Admitting: Orthopaedic Surgery

## 2017-06-03 MED ORDER — HYDROCODONE-ACETAMINOPHEN 7.5-325 MG PO TABS: 1.0000 | ORAL_TABLET | Freq: Four times a day (QID) | ORAL | 0 refills | Status: DC | PRN

## 2017-06-16 ENCOUNTER — Ambulatory Visit: Payer: BLUE CROSS/BLUE SHIELD | Admitting: Orthopaedic Surgery

## 2017-06-16 ENCOUNTER — Encounter: Payer: Self-pay | Admitting: Orthopaedic Surgery

## 2017-06-16 DIAGNOSIS — G8929 Other chronic pain: Secondary | ICD-10-CM | POA: Diagnosis not present

## 2017-06-16 DIAGNOSIS — M25511 Pain in right shoulder: Secondary | ICD-10-CM | POA: Diagnosis not present

## 2017-06-16 NOTE — Progress Notes (Signed)
PROCEDURE NOTE:  The patient request injection, verbal consent was obtained.  The right shoulder was prepped appropriately after time out was performed.   Sterile technique was observed and injection of 1 cc of Depo-Medrol 40 mg with several cc's of plain xylocaine. Anesthesia was provided by ethyl chloride and a 20-gauge needle was used to inject the shoulder area. A posterior approach was used.  The injection was tolerated well.  A band aid dressing was applied.  The patient was advised to apply ice later today and tomorrow to the injection sight as needed.  See as needed.  Electronically Signed Sanjuana Kava, MD 11/15/20183:12 PM

## 2017-07-04 ENCOUNTER — Other Ambulatory Visit: Payer: Self-pay | Admitting: Orthopedic Surgery

## 2017-07-04 ENCOUNTER — Ambulatory Visit (HOSPITAL_COMMUNITY)
Admission: RE | Admit: 2017-07-04 | Discharge: 2017-07-04 | Disposition: A | Discharge status: Home / Self Care | Payer: BLUE CROSS/BLUE SHIELD | Source: Ambulatory Visit | Attending: Obstetrics & Gynecology | Admitting: Obstetrics & Gynecology

## 2017-07-04 ENCOUNTER — Encounter (HOSPITAL_COMMUNITY): Payer: Self-pay

## 2017-07-04 DIAGNOSIS — Z1231 Encounter for screening mammogram for malignant neoplasm of breast: Secondary | ICD-10-CM | POA: Insufficient documentation

## 2017-07-06 MED ORDER — HYDROCODONE-ACETAMINOPHEN 7.5-325 MG PO TABS: 1.0000 | ORAL_TABLET | Freq: Four times a day (QID) | ORAL | 0 refills | Status: DC | PRN

## 2017-08-03 ENCOUNTER — Telehealth: Payer: Self-pay | Admitting: Orthopaedic Surgery

## 2017-08-03 MED ORDER — HYDROCODONE-ACETAMINOPHEN 7.5-325 MG PO TABS: 1.0000 | ORAL_TABLET | Freq: Four times a day (QID) | ORAL | 0 refills | Status: DC | PRN

## 2017-08-03 NOTE — Telephone Encounter (Signed)
Patient is having issues with her phone and unable to request meds through mychart.  She requests that I put in a message for this time.   She requests Hydrocodone/Acetaminophen 7.5-325  Mgs.   Qty  35  Sig: Take 1 tablet by mouth every 6 (six) hours as needed for moderate pain (Must last 30 days.Do not drive or operate machinery while taking this medicine.).  She uses [facility] in [place]

## 2017-09-05 ENCOUNTER — Other Ambulatory Visit (HOSPITAL_COMMUNITY)
Admission: RE | Admit: 2017-09-05 | Discharge: 2017-09-05 | Disposition: A | Discharge status: Home / Self Care | Payer: BLUE CROSS/BLUE SHIELD | Source: Ambulatory Visit | Attending: Obstetrics & Gynecology | Admitting: Obstetrics & Gynecology

## 2017-09-05 ENCOUNTER — Ambulatory Visit (INDEPENDENT_AMBULATORY_CARE_PROVIDER_SITE_OTHER): Payer: BLUE CROSS/BLUE SHIELD | Admitting: Obstetrics & Gynecology

## 2017-09-05 ENCOUNTER — Other Ambulatory Visit: Payer: Self-pay | Admitting: Orthopaedic Surgery

## 2017-09-05 ENCOUNTER — Encounter: Payer: Self-pay | Admitting: Obstetrics & Gynecology

## 2017-09-05 VITALS — BP 126/64 | HR 84 | Ht 64.0 in | Wt 214.0 lb

## 2017-09-05 DIAGNOSIS — Z7982 Long term (current) use of aspirin: Secondary | ICD-10-CM | POA: Diagnosis not present

## 2017-09-05 DIAGNOSIS — Z01419 Encounter for gynecological examination (general) (routine) without abnormal findings: Secondary | ICD-10-CM | POA: Diagnosis not present

## 2017-09-05 DIAGNOSIS — E119 Type 2 diabetes mellitus without complications: Secondary | ICD-10-CM | POA: Insufficient documentation

## 2017-09-05 DIAGNOSIS — Z9851 Tubal ligation status: Secondary | ICD-10-CM | POA: Insufficient documentation

## 2017-09-05 DIAGNOSIS — Z79899 Other long term (current) drug therapy: Secondary | ICD-10-CM | POA: Diagnosis not present

## 2017-09-05 DIAGNOSIS — Z1211 Encounter for screening for malignant neoplasm of colon: Secondary | ICD-10-CM

## 2017-09-05 DIAGNOSIS — Z1212 Encounter for screening for malignant neoplasm of rectum: Secondary | ICD-10-CM | POA: Diagnosis not present

## 2017-09-05 DIAGNOSIS — Z8249 Family history of ischemic heart disease and other diseases of the circulatory system: Secondary | ICD-10-CM | POA: Diagnosis not present

## 2017-09-05 DIAGNOSIS — Z7984 Long term (current) use of oral hypoglycemic drugs: Secondary | ICD-10-CM | POA: Insufficient documentation

## 2017-09-05 DIAGNOSIS — Z01411 Encounter for gynecological examination (general) (routine) with abnormal findings: Secondary | ICD-10-CM | POA: Diagnosis not present

## 2017-09-05 DIAGNOSIS — Z87891 Personal history of nicotine dependence: Secondary | ICD-10-CM | POA: Diagnosis not present

## 2017-09-05 DIAGNOSIS — Z833 Family history of diabetes mellitus: Secondary | ICD-10-CM | POA: Insufficient documentation

## 2017-09-05 DIAGNOSIS — Z1151 Encounter for screening for human papillomavirus (HPV): Secondary | ICD-10-CM | POA: Diagnosis not present

## 2017-09-05 NOTE — Progress Notes (Signed)
Subjective:     Brooke Sawyer is a 57 y.o. female here for a routine exam.  No LMP recorded. Patient is not currently having periods (Reason: Perimenopausal). No obstetric history on file. Birth Control Method:  Tubal ligation Menstrual Calendar(currently): amenorrheic  Current complaints: none.   Current acute medical issues:  diabetes   Recent Gynecologic History No LMP recorded. Patient is not currently having periods (Reason: Perimenopausal). Last Pap: 2017,  normal Last mammogram: 2018,  normal  Past Medical History:  Diagnosis Date  . Diabetes mellitus without complication [facility])     Past Surgical History:  Procedure Laterality Date  . COLONOSCOPY N/A 09/27/2012   Procedure: COLONOSCOPY;  Surgeon: Rogene Houston, MD;  [facility];  Service: Endoscopy;  Laterality: N/A;  1030  . Dilation and Currettage    . TUBAL LIGATION      OB History    No data available      Social History   Socioeconomic History  . Marital status: Married    Spouse name: None  . Number of children: None  . Years of education: None  . Highest education level: None  Social Needs  . Financial resource strain: None  . Food insecurity - worry: None  . Food insecurity - inability: None  . Transportation needs - medical: None  . Transportation needs - non-medical: None  Occupational History  . None  Tobacco Use  . Smoking status: Former Smoker    Packs/day: 0.50    Years: 6.00    Pack years: 3.00    Types: Cigarettes  . Smokeless tobacco: Never Used  Substance and Sexual Activity  . Alcohol use: No  . Drug use: No  . Sexual activity: Yes    Birth control/protection: None  Other Topics Concern  . None  Social History Narrative  . None    Family History  Problem Relation Age of Onset  . Diabetes Mother   . Heart disease Mother   . Diabetes Father   . Colon cancer Neg Hx      Current Outpatient Medications:  .  aspirin EC 81 MG tablet, Take 81 mg by mouth daily.,  Disp: , Rfl:  .  atorvastatin (LIPITOR) 20 MG tablet, Take 20 mg at bedtime by mouth., Disp: , Rfl: 3 .  canagliflozin (INVOKANA) 300 MG TABS tablet, Take 300 mg by mouth daily before breakfast., Disp: , Rfl:  .  cholecalciferol (VITAMIN D) 1000 UNITS tablet, Take 1,000 Units by mouth daily., Disp: , Rfl:  .  Cyanocobalamin (B-12 PO), Take 1 tablet by mouth daily., Disp: , Rfl:  .  glipiZIDE (GLUCOTROL) 5 MG tablet, Take by mouth daily before breakfast., Disp: , Rfl:  .  metFORMIN (GLUCOPHAGE) 500 MG tablet, Take 500-1,000 mg by mouth 2 (two) times daily with a meal. Takes 1000 mg in the morning and 500 mg in the evening, Disp: , Rfl:  .  HYDROcodone-acetaminophen (NORCO) 7.5-325 MG tablet, Take 1 tablet by mouth every 6 (six) hours as needed for moderate pain (Must last 30 days.Do not drive or operate machinery while taking this medicine.)., Disp: 35 tablet, Rfl: 0 .  terconazole (TERAZOL 7) 0.4 % vaginal cream, Place 1 applicator vaginally at bedtime., Disp: 45 g, Rfl: 0  Review of Systems  Review of Systems  Constitutional: Negative for fever, chills, weight loss, malaise/fatigue and diaphoresis.  HENT: Negative for hearing loss, ear pain, nosebleeds, congestion, sore throat, neck pain, tinnitus and ear discharge.   Eyes:  Negative for blurred vision, double vision, photophobia, pain, discharge and redness.  Respiratory: Negative for cough, hemoptysis, sputum production, shortness of breath, wheezing and stridor.   Cardiovascular: Negative for chest pain, palpitations, orthopnea, claudication, leg swelling and PND.  Gastrointestinal: negative for abdominal pain. Negative for heartburn, nausea, vomiting, diarrhea, constipation, blood in stool and melena.  Genitourinary: Negative for dysuria, urgency, frequency, hematuria and flank pain.  Musculoskeletal: Negative for myalgias, back pain, joint pain and falls.  Skin: Negative for itching and rash.  Neurological: Negative for dizziness,  tingling, tremors, sensory change, speech change, focal weakness, seizures, loss of consciousness, weakness and headaches.  Endo/Heme/Allergies: Negative for environmental allergies and polydipsia. Does not bruise/bleed easily.  Psychiatric/Behavioral: Negative for depression, suicidal ideas, hallucinations, memory loss and substance abuse. The patient is not nervous/anxious and does not have insomnia.        Objective:  Blood pressure 126/64, pulse 84, height 5\' 4"  (1.626 m), weight 214 lb (97.1 kg).   Physical Exam  Vitals reviewed. Constitutional: She is oriented to person, place, and time. She appears well-developed and well-nourished.  HENT:  Head: Normocephalic and atraumatic.        Right Ear: External ear normal.  Left Ear: External ear normal.  Nose: Nose normal.  Mouth/Throat: Oropharynx is clear and moist.  Eyes: Conjunctivae and EOM are normal. Pupils are equal, round, and reactive to light. Right eye exhibits no discharge. Left eye exhibits no discharge. No scleral icterus.  Neck: Normal range of motion. Neck supple. No tracheal deviation present. No thyromegaly present.  Cardiovascular: Normal rate, regular rhythm, normal heart sounds and intact distal pulses.  Exam reveals no gallop and no friction rub.   No murmur heard. Respiratory: Effort normal and breath sounds normal. No respiratory distress. She has no wheezes. She has no rales. She exhibits no tenderness.  GI: Soft. Bowel sounds are normal. She exhibits no distension and no mass. There is no tenderness. There is no rebound and no guarding.  Genitourinary:  Breasts no masses skin changes or nipple changes bilaterally      Vulva is normal without lesions Vagina is pink moist without discharge Cervix normal in appearance and pap is done Uterus is normal size shape and contour Adnexa is negative with normal sized ovaries  {Rectal    hemoccult negative, normal tone, no masses  Musculoskeletal: Normal range of motion.  She exhibits no edema and no tenderness.  Neurological: She is alert and oriented to person, place, and time. She has normal reflexes. She displays normal reflexes. No cranial nerve deficit. She exhibits normal muscle tone. Coordination normal.  Skin: Skin is warm and dry. No rash noted. No erythema. No pallor.  Psychiatric: She has a normal mood and affect. Her behavior is normal. Judgment and thought content normal.       Medications Ordered at today's visit: No orders of the defined types were placed in this encounter.   Other orders placed at today's visit: No orders of the defined types were placed in this encounter.     Assessment:    Healthy female exam.    Plan:    Follow up in: 1 year.     Return in about 1 year (around 09/05/2018) for yearly, with Dr Elonda Husky.

## 2017-09-06 MED ORDER — HYDROCODONE-ACETAMINOPHEN 7.5-325 MG PO TABS: 1.0000 | ORAL_TABLET | Freq: Four times a day (QID) | ORAL | 0 refills | Status: DC | PRN

## 2017-09-06 NOTE — Telephone Encounter (Signed)
Please review and advise patient's refill request for HYDROcodone-acetaminophen ([place]) 7.5-325 MG tablet Sanjuana Kava, MD] -   last seen 06/16/17, at which time, visit summary "follow up as needed."  Preferred pharmacy: [id number] - [place], [place]

## 2017-09-07 LAB — CYTOLOGY - PAP
Diagnosis: NEGATIVE
HPV: NOT DETECTED

## 2017-09-15 ENCOUNTER — Other Ambulatory Visit: Payer: Self-pay | Admitting: Obstetrics & Gynecology

## 2017-09-15 ENCOUNTER — Telehealth: Payer: Self-pay | Admitting: *Deleted

## 2017-09-15 MED ORDER — FLUCONAZOLE 150 MG PO TABS: 150.0000 mg | ORAL_TABLET | Freq: Once | ORAL | 0 refills | Status: AC

## 2017-09-15 NOTE — Telephone Encounter (Signed)
Patient is having vaginal discharge with itching. Requesting Diflucan.

## 2017-09-23 ENCOUNTER — Telehealth: Payer: Self-pay | Admitting: *Deleted

## 2017-09-23 ENCOUNTER — Telehealth: Payer: Self-pay | Admitting: Obstetrics & Gynecology

## 2017-09-23 MED ORDER — FLUCONAZOLE 150 MG PO TABS: 150.0000 mg | ORAL_TABLET | Freq: Once | ORAL | 0 refills | Status: DC

## 2017-09-23 NOTE — Telephone Encounter (Signed)
Diflucan refilled 

## 2017-10-01 ENCOUNTER — Other Ambulatory Visit: Payer: Self-pay | Admitting: Orthopaedic Surgery

## 2017-10-04 ENCOUNTER — Other Ambulatory Visit: Payer: Self-pay | Admitting: Orthopedic Surgery

## 2017-10-04 MED ORDER — HYDROCODONE-ACETAMINOPHEN 7.5-325 MG PO TABS: 1.0000 | ORAL_TABLET | Freq: Four times a day (QID) | ORAL | 0 refills | Status: DC | PRN

## 2017-11-02 ENCOUNTER — Ambulatory Visit: Payer: BLUE CROSS/BLUE SHIELD | Admitting: Orthopedic Surgery

## 2017-11-02 ENCOUNTER — Encounter: Payer: Self-pay | Admitting: Orthopedic Surgery

## 2017-11-02 VITALS — BP 128/71 | HR 83 | Ht 64.0 in | Wt 214.0 lb

## 2017-11-02 DIAGNOSIS — M25511 Pain in right shoulder: Secondary | ICD-10-CM | POA: Diagnosis not present

## 2017-11-02 DIAGNOSIS — G8929 Other chronic pain: Secondary | ICD-10-CM | POA: Diagnosis not present

## 2017-11-02 MED ORDER — HYDROCODONE-ACETAMINOPHEN 7.5-325 MG PO TABS: 1.0000 | ORAL_TABLET | Freq: Four times a day (QID) | ORAL | 0 refills | Status: DC | PRN

## 2017-11-02 NOTE — Progress Notes (Signed)
Progress Note   Patient ID: Brooke Sawyer, female   DOB: February 26, 1961, 57 y.o.   MRN: [id number]  Chief Complaint  Patient presents with  . Shoulder Pain    right     HPI 57 year old female with chronic right shoulder pain previously received injection did well currently on hydrocodone 7.5 mg with good pain relief presents for repeat right shoulder injection ROS Current Meds  Medication Sig  . aspirin EC 81 MG tablet Take 81 mg by mouth daily.  Marland Kitchen atorvastatin (LIPITOR) 20 MG tablet Take 20 mg at bedtime by mouth.  . canagliflozin (INVOKANA) 300 MG TABS tablet Take 300 mg by mouth daily before breakfast.  . cholecalciferol (VITAMIN D) 1000 UNITS tablet Take 1,000 Units by mouth daily.  . Cyanocobalamin (B-12 PO) Take 1 tablet by mouth daily.  Marland Kitchen glipiZIDE (GLUCOTROL) 5 MG tablet Take by mouth daily before breakfast.  . metFORMIN (GLUCOPHAGE) 500 MG tablet Take 500-1,000 mg by mouth 2 (two) times daily with a meal. Takes 1000 mg in the morning and 500 mg in the evening  . [DISCONTINUED] HYDROcodone-acetaminophen (NORCO) 7.5-325 MG tablet Take 1 tablet by mouth every 6 (six) hours as needed for moderate pain.    Past Medical History:  Diagnosis Date  . Diabetes mellitus without complication ([facility])      No Known Allergies  BP 128/71   Pulse 83   Ht 5\' 4"  (1.626 m)   Wt 214 lb (97.1 kg)   BMI 36.73 kg/m    Physical Exam  Constitutional: She is oriented to person, place, and time. She appears well-developed and well-nourished.  Neurological: She is alert and oriented to person, place, and time.  Psychiatric: She has a normal mood and affect. Judgment normal.  Vitals reviewed.   Ortho Exam  She does exhibit 150 degrees of forward elevation in the scapular plane Medical decision-making  Imaging: none    Encounter Diagnosis  Name Primary?  . Chronic right shoulder pain Yes    Procedure note the subacromial injection shoulder RIGHT  Verbal consent was obtained to inject  the  RIGHT   Shoulder  Timeout was completed to confirm the injection site is a subacromial space of the  RIGHT  shoulder   Medication used Depo-Medrol 40 mg and lidocaine 1% 3 cc  Anesthesia was provided by ethyl chloride  The injection was performed in the RIGHT  posterior subacromial space. After pinning the skin with alcohol and anesthetized the skin with ethyl chloride the subacromial space was injected using a 20-gauge needle. There were no complications  Sterile dressing was applied.    Meds ordered this encounter  Medications  . DISCONTD: HYDROcodone-acetaminophen (NORCO) 7.5-325 MG tablet    Sig: Take 1 tablet by mouth every 6 (six) hours as needed for moderate pain.    Dispense:  28 tablet    Refill:  0  . HYDROcodone-acetaminophen (NORCO) 7.5-325 MG tablet    Sig: Take 1 tablet by mouth every 6 (six) hours as needed for moderate pain.    Dispense:  28 tablet    Refill:  0     Arther Abbott, MD 11/02/2017 4:08 PM

## 2017-11-30 ENCOUNTER — Other Ambulatory Visit: Payer: Self-pay | Admitting: Orthopedic Surgery

## 2017-11-30 DIAGNOSIS — M25511 Pain in right shoulder: Principal | ICD-10-CM

## 2017-11-30 DIAGNOSIS — G8929 Other chronic pain: Secondary | ICD-10-CM

## 2017-11-30 MED ORDER — HYDROCODONE-ACETAMINOPHEN 7.5-325 MG PO TABS: 1.0000 | ORAL_TABLET | Freq: Four times a day (QID) | ORAL | 0 refills | Status: DC | PRN

## 2018-01-01 ENCOUNTER — Other Ambulatory Visit: Payer: Self-pay | Admitting: Orthopedic Surgery

## 2018-01-01 DIAGNOSIS — M25511 Pain in right shoulder: Principal | ICD-10-CM

## 2018-01-01 DIAGNOSIS — G8929 Other chronic pain: Secondary | ICD-10-CM

## 2018-01-03 MED ORDER — HYDROCODONE-ACETAMINOPHEN 7.5-325 MG PO TABS: 1.0000 | ORAL_TABLET | Freq: Four times a day (QID) | ORAL | 0 refills | Status: DC | PRN

## 2018-01-31 ENCOUNTER — Ambulatory Visit: Payer: BLUE CROSS/BLUE SHIELD | Admitting: Orthopaedic Surgery

## 2018-01-31 ENCOUNTER — Encounter: Payer: Self-pay | Admitting: Orthopaedic Surgery

## 2018-01-31 DIAGNOSIS — M25511 Pain in right shoulder: Secondary | ICD-10-CM

## 2018-01-31 DIAGNOSIS — G8929 Other chronic pain: Secondary | ICD-10-CM | POA: Diagnosis not present

## 2018-01-31 MED ORDER — HYDROCODONE-ACETAMINOPHEN 7.5-325 MG PO TABS: 1.0000 | ORAL_TABLET | Freq: Four times a day (QID) | ORAL | 0 refills | Status: DC | PRN

## 2018-01-31 NOTE — Progress Notes (Signed)
PROCEDURE NOTE:  The patient request injection, verbal consent was obtained.  The right shoulder was prepped appropriately after time out was performed.   Sterile technique was observed and injection of 1 cc of Depo-Medrol 40 mg with several cc's of plain xylocaine. Anesthesia was provided by ethyl chloride and a 20-gauge needle was used to inject the shoulder area. A posterior approach was used.  The injection was tolerated well.  A band aid dressing was applied.  The patient was advised to apply ice later today and tomorrow to the injection sight as needed.  I have refilled her pain medicine.  I will see in three months.  Call if any problem.  Precautions discussed.     I have reviewed the [facility] web site prior to prescribing narcotic medicine for this patient.  Electronically Signed Sanjuana Kava, MD 7/2/20199:39 AM

## 2018-03-01 ENCOUNTER — Other Ambulatory Visit: Payer: Self-pay | Admitting: Orthopaedic Surgery

## 2018-03-01 DIAGNOSIS — M25511 Pain in right shoulder: Principal | ICD-10-CM

## 2018-03-01 DIAGNOSIS — G8929 Other chronic pain: Secondary | ICD-10-CM

## 2018-03-06 MED ORDER — HYDROCODONE-ACETAMINOPHEN 7.5-325 MG PO TABS: 1.0000 | ORAL_TABLET | Freq: Four times a day (QID) | ORAL | 0 refills | Status: DC | PRN

## 2018-03-28 ENCOUNTER — Other Ambulatory Visit: Payer: Self-pay

## 2018-03-28 DIAGNOSIS — G8929 Other chronic pain: Secondary | ICD-10-CM

## 2018-03-28 DIAGNOSIS — M25511 Pain in right shoulder: Principal | ICD-10-CM

## 2018-03-28 MED ORDER — HYDROCODONE-ACETAMINOPHEN 7.5-325 MG PO TABS: 1.0000 | ORAL_TABLET | Freq: Four times a day (QID) | ORAL | 0 refills | Status: DC | PRN

## 2018-04-14 DIAGNOSIS — M1712 Unilateral primary osteoarthritis, left knee: Secondary | ICD-10-CM | POA: Diagnosis not present

## 2018-04-14 DIAGNOSIS — M19041 Primary osteoarthritis, right hand: Secondary | ICD-10-CM | POA: Diagnosis not present

## 2018-04-14 DIAGNOSIS — Z79899 Other long term (current) drug therapy: Secondary | ICD-10-CM | POA: Diagnosis not present

## 2018-04-14 DIAGNOSIS — M059 Rheumatoid arthritis with rheumatoid factor, unspecified: Secondary | ICD-10-CM | POA: Insufficient documentation

## 2018-04-14 DIAGNOSIS — M06872 Other specified rheumatoid arthritis, left ankle and foot: Secondary | ICD-10-CM | POA: Diagnosis not present

## 2018-04-14 DIAGNOSIS — M06871 Other specified rheumatoid arthritis, right ankle and foot: Secondary | ICD-10-CM | POA: Diagnosis not present

## 2018-04-14 DIAGNOSIS — M19042 Primary osteoarthritis, left hand: Secondary | ICD-10-CM | POA: Diagnosis not present

## 2018-05-01 ENCOUNTER — Other Ambulatory Visit: Payer: Self-pay

## 2018-05-01 DIAGNOSIS — M25511 Pain in right shoulder: Principal | ICD-10-CM

## 2018-05-01 DIAGNOSIS — G8929 Other chronic pain: Secondary | ICD-10-CM

## 2018-05-02 MED ORDER — HYDROCODONE-ACETAMINOPHEN 7.5-325 MG PO TABS: 1.0000 | ORAL_TABLET | Freq: Four times a day (QID) | ORAL | 0 refills | Status: DC | PRN

## 2018-05-03 ENCOUNTER — Ambulatory Visit: Payer: BLUE CROSS/BLUE SHIELD | Admitting: Orthopaedic Surgery

## 2018-05-09 ENCOUNTER — Ambulatory Visit: Payer: BLUE CROSS/BLUE SHIELD | Admitting: Orthopaedic Surgery

## 2018-05-11 ENCOUNTER — Encounter: Payer: Self-pay | Admitting: Orthopaedic Surgery

## 2018-05-11 ENCOUNTER — Ambulatory Visit: Payer: BLUE CROSS/BLUE SHIELD | Admitting: Orthopaedic Surgery

## 2018-05-11 DIAGNOSIS — M25511 Pain in right shoulder: Secondary | ICD-10-CM

## 2018-05-11 DIAGNOSIS — G8929 Other chronic pain: Secondary | ICD-10-CM

## 2018-05-11 NOTE — Progress Notes (Signed)
PROCEDURE NOTE:  The patient request injection, verbal consent was obtained.  The right shoulder was prepped appropriately after time out was performed.   Sterile technique was observed and injection of 1 cc of Depo-Medrol 40 mg with several cc's of plain xylocaine. Anesthesia was provided by ethyl chloride and a 20-gauge needle was used to inject the shoulder area. A posterior approach was used.  The injection was tolerated well.  A band aid dressing was applied.  The patient was advised to apply ice later today and tomorrow to the injection sight as needed.  She has been laid off work and is looking for new job.  I will see her in three months.  Call if any problem.  Precautions discussed.   Electronically Signed Sanjuana Kava, MD 10/10/20198:58 AM

## 2018-05-26 ENCOUNTER — Other Ambulatory Visit: Payer: Self-pay | Admitting: Obstetrics & Gynecology

## 2018-05-26 DIAGNOSIS — Z1231 Encounter for screening mammogram for malignant neoplasm of breast: Secondary | ICD-10-CM

## 2018-06-02 ENCOUNTER — Other Ambulatory Visit: Payer: Self-pay

## 2018-06-02 DIAGNOSIS — G8929 Other chronic pain: Secondary | ICD-10-CM

## 2018-06-02 DIAGNOSIS — M25511 Pain in right shoulder: Principal | ICD-10-CM

## 2018-06-06 MED ORDER — HYDROCODONE-ACETAMINOPHEN 7.5-325 MG PO TABS: 1.0000 | ORAL_TABLET | Freq: Four times a day (QID) | ORAL | 0 refills | Status: DC | PRN

## 2018-06-16 DIAGNOSIS — M059 Rheumatoid arthritis with rheumatoid factor, unspecified: Secondary | ICD-10-CM | POA: Diagnosis not present

## 2018-06-16 DIAGNOSIS — Z79899 Other long term (current) drug therapy: Secondary | ICD-10-CM | POA: Diagnosis not present

## 2018-07-01 ENCOUNTER — Other Ambulatory Visit: Payer: Self-pay | Admitting: Obstetrics & Gynecology

## 2018-07-02 ENCOUNTER — Other Ambulatory Visit: Payer: Self-pay

## 2018-07-02 DIAGNOSIS — G8929 Other chronic pain: Secondary | ICD-10-CM

## 2018-07-02 DIAGNOSIS — M25511 Pain in right shoulder: Principal | ICD-10-CM

## 2018-07-04 MED ORDER — HYDROCODONE-ACETAMINOPHEN 7.5-325 MG PO TABS: 1.0000 | ORAL_TABLET | Freq: Four times a day (QID) | ORAL | 0 refills | Status: DC | PRN

## 2018-07-05 ENCOUNTER — Ambulatory Visit (HOSPITAL_COMMUNITY)
Admission: RE | Admit: 2018-07-05 | Discharge: 2018-07-05 | Disposition: A | Discharge status: Home / Self Care | Payer: BLUE CROSS/BLUE SHIELD | Source: Ambulatory Visit | Attending: Obstetrics & Gynecology | Admitting: Obstetrics & Gynecology

## 2018-07-05 ENCOUNTER — Encounter (HOSPITAL_COMMUNITY): Payer: Self-pay

## 2018-07-05 DIAGNOSIS — Z1231 Encounter for screening mammogram for malignant neoplasm of breast: Secondary | ICD-10-CM | POA: Diagnosis not present

## 2018-08-02 ENCOUNTER — Other Ambulatory Visit: Payer: Self-pay

## 2018-08-02 DIAGNOSIS — M25511 Pain in right shoulder: Principal | ICD-10-CM

## 2018-08-02 DIAGNOSIS — G8929 Other chronic pain: Secondary | ICD-10-CM

## 2018-08-03 MED ORDER — HYDROCODONE-ACETAMINOPHEN 7.5-325 MG PO TABS: 1.0000 | ORAL_TABLET | Freq: Four times a day (QID) | ORAL | 0 refills | Status: DC | PRN

## 2018-08-09 ENCOUNTER — Ambulatory Visit: Payer: BLUE CROSS/BLUE SHIELD | Admitting: Orthopaedic Surgery

## 2018-08-09 ENCOUNTER — Encounter: Payer: Self-pay | Admitting: Orthopaedic Surgery

## 2018-08-09 VITALS — BP 125/71 | HR 72 | Ht 64.0 in | Wt 215.0 lb

## 2018-08-09 DIAGNOSIS — G8929 Other chronic pain: Secondary | ICD-10-CM

## 2018-08-09 DIAGNOSIS — M25511 Pain in right shoulder: Secondary | ICD-10-CM

## 2018-08-09 NOTE — Progress Notes (Signed)
PROCEDURE NOTE:  The patient request injection, verbal consent was obtained.  The right shoulder was prepped appropriately after time out was performed.   Sterile technique was observed and injection of 1 cc of Depo-Medrol 40 mg with several cc's of plain xylocaine. Anesthesia was provided by ethyl chloride and a 20-gauge needle was used to inject the shoulder area. A posterior approach was used.  The injection was tolerated well.  A band aid dressing was applied.  The patient was advised to apply ice later today and tomorrow to the injection sight as needed.  Return in three months.  Call if any problem.  Precautions discussed.   Electronically Signed Sanjuana Kava, MD 1/8/20208:23 AM

## 2018-08-10 ENCOUNTER — Ambulatory Visit: Payer: BLUE CROSS/BLUE SHIELD | Admitting: Orthopaedic Surgery

## 2018-09-03 ENCOUNTER — Other Ambulatory Visit: Payer: Self-pay

## 2018-09-03 DIAGNOSIS — M25511 Pain in right shoulder: Principal | ICD-10-CM

## 2018-09-03 DIAGNOSIS — G8929 Other chronic pain: Secondary | ICD-10-CM

## 2018-09-04 NOTE — Telephone Encounter (Signed)
No more narcotics.  Take Advil, Aleve or Tylenol.

## 2018-09-06 NOTE — Telephone Encounter (Signed)
Spoke with patient voiced understanding

## 2018-09-07 NOTE — Telephone Encounter (Signed)
Denied.  Take tylenol, Advil or Aleve.

## 2018-10-04 DIAGNOSIS — Z79899 Other long term (current) drug therapy: Secondary | ICD-10-CM | POA: Diagnosis not present

## 2018-10-04 DIAGNOSIS — M059 Rheumatoid arthritis with rheumatoid factor, unspecified: Secondary | ICD-10-CM | POA: Diagnosis not present

## 2018-10-04 DIAGNOSIS — R202 Paresthesia of skin: Secondary | ICD-10-CM | POA: Diagnosis not present

## 2018-10-04 DIAGNOSIS — R200 Anesthesia of skin: Secondary | ICD-10-CM | POA: Diagnosis not present

## 2018-10-05 ENCOUNTER — Other Ambulatory Visit: Payer: Self-pay

## 2018-10-05 ENCOUNTER — Encounter: Payer: Self-pay | Admitting: Obstetrics & Gynecology

## 2018-10-05 ENCOUNTER — Ambulatory Visit (INDEPENDENT_AMBULATORY_CARE_PROVIDER_SITE_OTHER): Payer: BLUE CROSS/BLUE SHIELD | Admitting: Obstetrics & Gynecology

## 2018-10-05 VITALS — BP 128/69 | HR 92 | Ht 64.0 in | Wt 217.0 lb

## 2018-10-05 DIAGNOSIS — Z01419 Encounter for gynecological examination (general) (routine) without abnormal findings: Secondary | ICD-10-CM

## 2018-10-05 NOTE — Progress Notes (Signed)
Subjective:     Brooke Sawyer is a 58 y.o. female here for a routine exam.  No LMP recorded. (Menstrual status: Perimenopausal). G2P2 Birth Control Method:  menopausal Menstrual Calendar(currently): amenorrhea  Current complaints: none.   Current acute medical issues:  Diabetes, good control   Recent Gynecologic History No LMP recorded. (Menstrual status: Perimenopausal). Last Pap: 2019,   Last mammogram: 12/19,  normal  Past Medical History:  Diagnosis Date  . Diabetes mellitus without complication [facility])     Past Surgical History:  Procedure Laterality Date  . COLONOSCOPY N/A 09/27/2012   Procedure: COLONOSCOPY;  Surgeon: Rogene Houston, MD;  Location: [facility];  Service: Endoscopy;  Laterality: N/A;  1030  . Dilation and [place]    . TUBAL LIGATION      OB History    Gravida  2   Para  2   Term      Preterm      AB      Living  2     SAB      TAB      Ectopic      Multiple      Live Births              Social History   Socioeconomic History  . Marital status: Married    Spouse name: Not on file  . Number of children: Not on file  . Years of education: Not on file  . Highest education level: Not on file  Occupational History  . Not on file  Social Needs  . Financial resource strain: Not on file  . Food insecurity:    Worry: Not on file    Inability: Not on file  . Transportation needs:    Medical: Not on file    Non-medical: Not on file  Tobacco Use  . Smoking status: Former Smoker    Packs/day: 0.50    Years: 6.00    Pack years: 3.00    Types: Cigarettes  . Smokeless tobacco: Never Used  Substance and Sexual Activity  . Alcohol use: No  . Drug use: No  . Sexual activity: Yes    Birth control/protection: None  Lifestyle  . Physical activity:    Days per week: Not on file    Minutes per session: Not on file  . Stress: Not on file  Relationships  . Social connections:    Talks on phone: Not on file    Gets together:  Not on file    Attends religious service: Not on file    Active member of club or organization: Not on file    Attends meetings of clubs or organizations: Not on file    Relationship status: Not on file  Other Topics Concern  . Not on file  Social History Narrative  . Not on file    Family History  Problem Relation Age of Onset  . Diabetes Mother   . Heart disease Mother   . Diabetes Father   . Hypertension Brother   . Graves' disease Daughter   . Colon cancer Neg Hx      Current Outpatient Medications:  .  aspirin EC 81 MG tablet, Take 81 mg by mouth daily., Disp: , Rfl:  .  atorvastatin (LIPITOR) 20 MG tablet, Take 20 mg at bedtime by mouth., Disp: , Rfl: 3 .  canagliflozin (INVOKANA) 300 MG TABS tablet, Take 300 mg by mouth daily before breakfast., Disp: , Rfl:  .  cholecalciferol (VITAMIN D) 1000 UNITS tablet, Take 1,000 Units by mouth daily., Disp: , Rfl:  .  Cyanocobalamin (B-12 PO), Take 1 tablet by mouth daily., Disp: , Rfl:  .  folic acid (FOLVITE) 1 MG tablet, Take 1 mg by mouth daily., Disp: , Rfl: 4 .  glipiZIDE (GLUCOTROL) 5 MG tablet, Take by mouth daily before breakfast., Disp: , Rfl:  .  metFORMIN (GLUCOPHAGE) 500 MG tablet, Take 500-1,000 mg by mouth 2 (two) times daily with a meal. Takes 1000 mg in the morning and 500 mg in the evening, Disp: , Rfl:  .  methotrexate (RHEUMATREX) 2.5 MG tablet, , Disp: , Rfl:  .  amoxicillin (AMOXIL) 875 MG tablet, , Disp: , Rfl:   Review of Systems  Review of Systems  Constitutional: Negative for fever, chills, weight loss, malaise/fatigue and diaphoresis.  HENT: Negative for hearing loss, ear pain, nosebleeds, congestion, sore throat, neck pain, tinnitus and ear discharge.   Eyes: Negative for blurred vision, double vision, photophobia, pain, discharge and redness.  Respiratory: Negative for cough, hemoptysis, sputum production, shortness of breath, wheezing and stridor.   Cardiovascular: Negative for chest pain,  palpitations, orthopnea, claudication, leg swelling and PND.  Gastrointestinal: negative for abdominal pain. Negative for heartburn, nausea, vomiting, diarrhea, constipation, blood in stool and melena.  Genitourinary: Negative for dysuria, urgency, frequency, hematuria and flank pain.  Musculoskeletal: Negative for myalgias, back pain, joint pain and falls.  Skin: Negative for itching and rash.  Neurological: Negative for dizziness, tingling, tremors, sensory change, speech change, focal weakness, seizures, loss of consciousness, weakness and headaches.  Endo/Heme/Allergies: Negative for environmental allergies and polydipsia. Does not bruise/bleed easily.  Psychiatric/Behavioral: Negative for depression, suicidal ideas, hallucinations, memory loss and substance abuse. The patient is not nervous/anxious and does not have insomnia.        Objective:  Blood pressure 128/69, pulse 92, height 5\' 4"  (1.626 m), weight 217 lb (98.4 kg).   Physical Exam  Vitals reviewed. Constitutional: She is oriented to person, place, and time. She appears well-developed and well-nourished.  HENT:  Head: Normocephalic and atraumatic.        Right Ear: External ear normal.  Left Ear: External ear normal.  Nose: Nose normal.  Mouth/Throat: Oropharynx is clear and moist.  Eyes: Conjunctivae and EOM are normal. Pupils are equal, round, and reactive to light. Right eye exhibits no discharge. Left eye exhibits no discharge. No scleral icterus.  Neck: Normal range of motion. Neck supple. No tracheal deviation present. No thyromegaly present.  Cardiovascular: Normal rate, regular rhythm, normal heart sounds and intact distal pulses.  Exam reveals no gallop and no friction rub.   No murmur heard. Respiratory: Effort normal and breath sounds normal. No respiratory distress. She has no wheezes. She has no rales. She exhibits no tenderness.  GI: Soft. Bowel sounds are normal. She exhibits no distension and no mass. There is  no tenderness. There is no rebound and no guarding.  Genitourinary:  Breasts no masses skin changes or nipple changes bilaterally      Vulva is normal without lesions Vagina is pink moist without discharge Cervix normal in appearance and pap is done Uterus is normal size shape and contour Adnexa is negative with normal sized ovaries  {Rectal    hemoccult negative, normal tone, no masses  Musculoskeletal: Normal range of motion. She exhibits no edema and no tenderness.  Neurological: She is alert and oriented to person, place, and time. She has normal reflexes. She displays normal reflexes.  No cranial nerve deficit. She exhibits normal muscle tone. Coordination normal.  Skin: Skin is warm and dry. No rash noted. No erythema. No pallor.  Psychiatric: She has a normal mood and affect. Her behavior is normal. Judgment and thought content normal.       Medications Ordered at today's visit: No orders of the defined types were placed in this encounter.   Other orders placed at today's visit: No orders of the defined types were placed in this encounter.     Assessment:    Healthy female exam.    Plan:    Mammogram ordered. Follow up in: 1 year.     Return in about 1 year (around 10/05/2019) for yearly.

## 2018-10-23 ENCOUNTER — Telehealth: Payer: Self-pay | Admitting: Obstetrics & Gynecology

## 2018-10-23 ENCOUNTER — Telehealth: Payer: Self-pay | Admitting: *Deleted

## 2018-10-23 MED ORDER — SULFAMETHOXAZOLE-TRIMETHOPRIM 800-160 MG PO TABS: 1.0000 | ORAL_TABLET | Freq: Two times a day (BID) | ORAL | 0 refills | Status: DC

## 2018-10-23 MED ORDER — SILVER SULFADIAZINE 1 % EX CREA: TOPICAL_CREAM | CUTANEOUS | 11 refills | Status: DC

## 2018-10-23 NOTE — Telephone Encounter (Signed)
Patient called stating that she would like a call back from Dr. Elonda Husky. Pt did not state the reason why. Please contact pt

## 2018-10-23 NOTE — Telephone Encounter (Signed)
Pt called stating that she has a boil on her abdomen that she has been trying to pop. She states she cant get "it all out" and asks if she should make an appt. Advised pt to not attempt to pop the area. Advised that Dr Elonda Husky would send in an antibiotic and silvadene cream to her pharmacy. Advised that she could check with them later today. Pt verbalized understanding.

## 2018-10-23 NOTE — Telephone Encounter (Signed)
LMOVM returning pts call.  

## 2018-10-23 NOTE — Telephone Encounter (Signed)
See med note

## 2018-11-08 ENCOUNTER — Encounter: Payer: Self-pay | Admitting: Orthopaedic Surgery

## 2018-11-08 ENCOUNTER — Other Ambulatory Visit: Payer: Self-pay

## 2018-11-08 ENCOUNTER — Ambulatory Visit: Payer: BLUE CROSS/BLUE SHIELD | Admitting: Orthopaedic Surgery

## 2018-11-08 VITALS — Ht 64.0 in | Wt 211.0 lb

## 2018-11-08 DIAGNOSIS — G8929 Other chronic pain: Secondary | ICD-10-CM | POA: Diagnosis not present

## 2018-11-08 DIAGNOSIS — M25511 Pain in right shoulder: Secondary | ICD-10-CM

## 2018-11-08 MED ORDER — HYDROCODONE-ACETAMINOPHEN 5-325 MG PO TABS: ORAL_TABLET | ORAL | 0 refills | Status: DC

## 2018-11-08 NOTE — Progress Notes (Signed)
PROCEDURE NOTE:  The patient request injection, verbal consent was obtained.  The right shoulder was prepped appropriately after time out was performed.   Sterile technique was observed and injection of 1 cc of Depo-Medrol 40 mg with several cc's of plain xylocaine. Anesthesia was provided by ethyl chloride and a 20-gauge needle was used to inject the shoulder area. A posterior approach was used.  The injection was tolerated well.  A band aid dressing was applied.  The patient was advised to apply ice later today and tomorrow to the injection sight as needed.  I have reviewed the [place] web site prior to prescribing narcotic medicine for this patient.   Encounter Diagnosis  Name Primary?  . Chronic right shoulder pain Yes   Return in three months.  Call if any problem.  Precautions discussed.   Electronically Signed Sanjuana Kava, MD 4/8/202010:01 AM

## 2018-12-05 ENCOUNTER — Telehealth: Payer: Self-pay | Admitting: Orthopaedic Surgery

## 2018-12-05 MED ORDER — HYDROCODONE-ACETAMINOPHEN 5-325 MG PO TABS: ORAL_TABLET | ORAL | 0 refills | Status: DC

## 2018-12-05 NOTE — Telephone Encounter (Signed)
Hydrocodone-Acetaminophen  5/325 mg  Qty 28 Tablets  PATIENT USES [facility] IN [place]

## 2018-12-27 DIAGNOSIS — Z03818 Encounter for observation for suspected exposure to other biological agents ruled out: Secondary | ICD-10-CM | POA: Diagnosis not present

## 2019-01-02 ENCOUNTER — Other Ambulatory Visit: Payer: Self-pay

## 2019-01-03 MED ORDER — HYDROCODONE-ACETAMINOPHEN 5-325 MG PO TABS: ORAL_TABLET | ORAL | 0 refills | Status: DC

## 2019-01-17 DIAGNOSIS — Z79899 Other long term (current) drug therapy: Secondary | ICD-10-CM | POA: Diagnosis not present

## 2019-01-17 DIAGNOSIS — M059 Rheumatoid arthritis with rheumatoid factor, unspecified: Secondary | ICD-10-CM | POA: Diagnosis not present

## 2019-01-31 ENCOUNTER — Other Ambulatory Visit: Payer: Self-pay

## 2019-01-31 MED ORDER — HYDROCODONE-ACETAMINOPHEN 5-325 MG PO TABS: ORAL_TABLET | ORAL | 0 refills | Status: DC

## 2019-02-14 ENCOUNTER — Ambulatory Visit: Payer: Self-pay | Admitting: Orthopaedic Surgery

## 2019-02-15 ENCOUNTER — Ambulatory Visit: Payer: BLUE CROSS/BLUE SHIELD | Admitting: Orthopaedic Surgery

## 2019-02-22 ENCOUNTER — Ambulatory Visit: Payer: BC Managed Care – PPO | Admitting: Orthopaedic Surgery

## 2019-02-27 ENCOUNTER — Ambulatory Visit: Payer: BC Managed Care – PPO | Admitting: Orthopaedic Surgery

## 2019-03-05 ENCOUNTER — Other Ambulatory Visit: Payer: Self-pay

## 2019-03-06 MED ORDER — HYDROCODONE-ACETAMINOPHEN 5-325 MG PO TABS: ORAL_TABLET | ORAL | 0 refills | Status: DC

## 2019-03-08 ENCOUNTER — Encounter: Payer: Self-pay | Admitting: Orthopaedic Surgery

## 2019-03-08 ENCOUNTER — Ambulatory Visit: Payer: BC Managed Care – PPO | Admitting: Orthopaedic Surgery

## 2019-03-08 ENCOUNTER — Other Ambulatory Visit: Payer: Self-pay

## 2019-03-08 VITALS — Temp 97.7°F | Ht 64.0 in | Wt 214.1 lb

## 2019-03-08 DIAGNOSIS — G8929 Other chronic pain: Secondary | ICD-10-CM

## 2019-03-08 DIAGNOSIS — M25511 Pain in right shoulder: Secondary | ICD-10-CM | POA: Diagnosis not present

## 2019-03-08 NOTE — Progress Notes (Signed)
PROCEDURE NOTE:  The patient request injection, verbal consent was obtained.  The right shoulder was prepped appropriately after time out was performed.   Sterile technique was observed and injection of 1 cc of Depo-Medrol 40 mg with several cc's of plain xylocaine. Anesthesia was provided by ethyl chloride and a 20-gauge needle was used to inject the shoulder area. A posterior approach was used.  The injection was tolerated well.  A band aid dressing was applied.  The patient was advised to apply ice later today and tomorrow to the injection sight as needed.  I will see in three months.  Call if any problem.  Precautions discussed.   Electronically Signed Sanjuana Kava, MD 8/6/20208:30 AM

## 2019-04-02 ENCOUNTER — Other Ambulatory Visit: Payer: Self-pay

## 2019-04-03 ENCOUNTER — Ambulatory Visit (INDEPENDENT_AMBULATORY_CARE_PROVIDER_SITE_OTHER): Payer: BC Managed Care – PPO | Admitting: Orthopaedic Surgery

## 2019-04-03 ENCOUNTER — Encounter: Payer: Self-pay | Admitting: Orthopaedic Surgery

## 2019-04-03 ENCOUNTER — Other Ambulatory Visit: Payer: Self-pay

## 2019-04-03 DIAGNOSIS — M25511 Pain in right shoulder: Secondary | ICD-10-CM

## 2019-04-03 DIAGNOSIS — G8929 Other chronic pain: Secondary | ICD-10-CM | POA: Diagnosis not present

## 2019-04-03 MED ORDER — HYDROCODONE-ACETAMINOPHEN 5-325 MG PO TABS: ORAL_TABLET | ORAL | 0 refills | Status: DC

## 2019-04-03 NOTE — Progress Notes (Signed)
PROCEDURE NOTE:  The patient request injection, verbal consent was obtained.  The right shoulder was prepped appropriately after time out was performed.   Sterile technique was observed and injection of 1 cc of Depo-Medrol 40 mg with several cc's of plain xylocaine. Anesthesia was provided by ethyl chloride and a 20-gauge needle was used to inject the shoulder area. A posterior approach was used.  The injection was tolerated well.  A band aid dressing was applied.  The patient was advised to apply ice later today and tomorrow to the injection sight as needed.  Return in one month.  Electronically Signed Sanjuana Kava, MD 9/1/202010:27 AM

## 2019-04-23 DIAGNOSIS — Z79899 Other long term (current) drug therapy: Secondary | ICD-10-CM | POA: Diagnosis not present

## 2019-04-23 DIAGNOSIS — M059 Rheumatoid arthritis with rheumatoid factor, unspecified: Secondary | ICD-10-CM | POA: Diagnosis not present

## 2019-05-02 ENCOUNTER — Other Ambulatory Visit: Payer: Self-pay

## 2019-05-02 MED ORDER — HYDROCODONE-ACETAMINOPHEN 5-325 MG PO TABS: ORAL_TABLET | ORAL | 0 refills | Status: DC

## 2019-05-03 ENCOUNTER — Ambulatory Visit: Payer: BC Managed Care – PPO | Admitting: Orthopaedic Surgery

## 2019-05-18 ENCOUNTER — Telehealth: Payer: Self-pay | Admitting: *Deleted

## 2019-05-18 ENCOUNTER — Telehealth: Payer: Self-pay | Admitting: Obstetrics & Gynecology

## 2019-05-18 MED ORDER — FLUCONAZOLE 150 MG PO TABS: ORAL_TABLET | ORAL | 1 refills | Status: DC

## 2019-05-18 NOTE — Telephone Encounter (Signed)
Patient informed Diflucan has been sent to her pharmacy as requested.

## 2019-05-18 NOTE — Telephone Encounter (Signed)
Patient called requesting Diflucan be sent in for a yeast infection.

## 2019-05-18 NOTE — Telephone Encounter (Signed)
E prescribed diflucan

## 2019-06-01 ENCOUNTER — Other Ambulatory Visit (HOSPITAL_COMMUNITY): Payer: Self-pay | Admitting: Obstetrics & Gynecology

## 2019-06-01 DIAGNOSIS — Z1231 Encounter for screening mammogram for malignant neoplasm of breast: Secondary | ICD-10-CM

## 2019-06-03 ENCOUNTER — Other Ambulatory Visit: Payer: Self-pay

## 2019-06-05 ENCOUNTER — Ambulatory Visit (INDEPENDENT_AMBULATORY_CARE_PROVIDER_SITE_OTHER): Payer: BC Managed Care – PPO | Admitting: Orthopaedic Surgery

## 2019-06-05 ENCOUNTER — Encounter: Payer: Self-pay | Admitting: Orthopaedic Surgery

## 2019-06-05 ENCOUNTER — Other Ambulatory Visit: Payer: Self-pay

## 2019-06-05 DIAGNOSIS — M25511 Pain in right shoulder: Secondary | ICD-10-CM

## 2019-06-05 DIAGNOSIS — G8929 Other chronic pain: Secondary | ICD-10-CM

## 2019-06-05 MED ORDER — HYDROCODONE-ACETAMINOPHEN 5-325 MG PO TABS: ORAL_TABLET | ORAL | 0 refills | Status: DC

## 2019-06-05 NOTE — Progress Notes (Signed)
PROCEDURE NOTE:  The patient request injection, verbal consent was obtained.  The right shoulder was prepped appropriately after time out was performed.   Sterile technique was observed and injection of 1 cc of Depo-Medrol 40 mg with several cc's of plain xylocaine. Anesthesia was provided by ethyl chloride and a 20-gauge needle was used to inject the shoulder area. A posterior approach was used.  The injection was tolerated well.  A band aid dressing was applied.  The patient was advised to apply ice later today and tomorrow to the injection sight as needed.  Electronically Signed Sanjuana Kava, MD 11/3/20201:46 PM

## 2019-06-07 ENCOUNTER — Ambulatory Visit: Payer: BC Managed Care – PPO | Admitting: Orthopaedic Surgery

## 2019-07-02 ENCOUNTER — Other Ambulatory Visit: Payer: Self-pay

## 2019-07-03 ENCOUNTER — Encounter: Payer: Self-pay | Admitting: Orthopaedic Surgery

## 2019-07-03 ENCOUNTER — Other Ambulatory Visit: Payer: Self-pay

## 2019-07-03 ENCOUNTER — Ambulatory Visit (INDEPENDENT_AMBULATORY_CARE_PROVIDER_SITE_OTHER): Payer: BC Managed Care – PPO | Admitting: Orthopaedic Surgery

## 2019-07-03 VITALS — Temp 96.8°F

## 2019-07-03 DIAGNOSIS — G8929 Other chronic pain: Secondary | ICD-10-CM | POA: Diagnosis not present

## 2019-07-03 DIAGNOSIS — M25511 Pain in right shoulder: Secondary | ICD-10-CM

## 2019-07-03 MED ORDER — HYDROCODONE-ACETAMINOPHEN 5-325 MG PO TABS: ORAL_TABLET | ORAL | 0 refills | Status: DC

## 2019-07-03 NOTE — Progress Notes (Signed)
PROCEDURE NOTE:  The patient request injection, verbal consent was obtained.  The right shoulder was prepped appropriately after time out was performed.   Sterile technique was observed and injection of 1 cc of Depo-Medrol 40 mg with several cc's of plain xylocaine. Anesthesia was provided by ethyl chloride and a 20-gauge needle was used to inject the shoulder area. A posterior approach was used.  The injection was tolerated well.  A band aid dressing was applied.  The patient was advised to apply ice later today and tomorrow to the injection sight as needed.  Return in six weeks.  I have reviewed the [place] web site prior to prescribing narcotic medicine for this patient.   Electronically Signed Sanjuana Kava, MD 12/1/20208:19 AM

## 2019-07-09 ENCOUNTER — Other Ambulatory Visit: Payer: Self-pay

## 2019-07-09 ENCOUNTER — Ambulatory Visit (HOSPITAL_COMMUNITY)
Admission: RE | Admit: 2019-07-09 | Discharge: 2019-07-09 | Disposition: A | Discharge status: Home / Self Care | Payer: BC Managed Care – PPO | Source: Ambulatory Visit | Attending: Obstetrics & Gynecology | Admitting: Obstetrics & Gynecology

## 2019-07-09 DIAGNOSIS — Z1231 Encounter for screening mammogram for malignant neoplasm of breast: Secondary | ICD-10-CM

## 2019-07-23 NOTE — Progress Notes (Signed)
[facility] Note  07/25/2019     CHIEF COMPLAINT Patient presents for Retina Evaluation and Diabetic Eye Exam   HISTORY OF PRESENT ILLNESS: Brooke Sawyer is a 58 y.o. female who presents to the clinic today for:   HPI    Retina Evaluation    In both eyes.  This started 1 week ago.  Associated Symptoms Negative for Flashes, Pain, Trauma, Fever, Floaters, Redness, Scalp Tenderness, Weight Loss, Distortion, Photophobia, Jaw Claudication, Fatigue, Shoulder/Hip pain, Glare and Blind Spot.  Context:  distance vision, mid-range vision and near vision.  Treatments tried include no treatments.  I, the attending physician,  performed the HPI with the patient and updated documentation appropriately.          Diabetic Eye Exam    Vision is stable.  Associated Symptoms Negative for Blind Spot, Glare, Shoulder/Hip pain, Fatigue, Jaw Claudication, Photophobia, Distortion, Floaters, Redness, Scalp Tenderness, Weight Loss, Fever, Trauma, Pain and Flashes.  Diabetes characteristics include taking oral medications, controlled with diet and Type 2.  This started 20 years ago.  Blood sugar level is controlled.  Last A1C 6.5.  I, the attending physician,  performed the HPI with the patient and updated documentation appropriately.          Comments    Referral of DR. Cotter for DME. Patient states her vision is good, denies floater, flashes and ocular pain. Pt is DM2 x 20 yrs, BS are not monitored regularly, A1C 6.5 (2 mos ago), Bs are controlled with  Invokana, metformin and glipizide.        Last edited by Bernarda Caffey, MD on 07/25/2019  1:04 PM. (History)    Pt saw Dr. Jorja Loa for routine eye exam, she states it had been 3 years since her last exam, pt states Dr. Jorja Loa saw some diabetic changes in her eyes that he wanted checked out, pt state she has been diabetic for 20 years, her last A1c was 6.5, she takes metformin and invokana, pt states she feels like her vision is  very good, she states Dr. Jorja Loa did not give her a rx for glasses, but she does need reading glasses, pt denies being hypertensive, she states she takes medication for arthritis  Referring physician: Madelin Headings, DO [address] [place],  [place] [address]  HISTORICAL INFORMATION:   Selected notes from the [facility]: No current outpatient medications on file. (Ophthalmic Drugs)   No current facility-administered medications for this visit. (Ophthalmic Drugs)   Current Outpatient Medications (Other)  Medication Sig  . amoxicillin (AMOXIL) 875 MG tablet   . aspirin EC 81 MG tablet Take 81 mg by mouth daily.  Marland Kitchen atorvastatin (LIPITOR) 20 MG tablet Take 20 mg at bedtime by mouth.  . canagliflozin (INVOKANA) 300 MG TABS tablet Take 300 mg by mouth daily before breakfast.  . cholecalciferol (VITAMIN D) 1000 UNITS tablet Take 1,000 Units by mouth daily.  . Cyanocobalamin (B-12 PO) Take 1 tablet by mouth daily.  . fluconazole (DIFLUCAN) 150 MG tablet Take 1 tablet today and repeat in 3 days  . folic acid (FOLVITE) 1 MG tablet Take 1 mg by mouth daily.  Marland Kitchen glipiZIDE (GLUCOTROL) 5 MG tablet Take by mouth daily before breakfast.  . HYDROcodone-acetaminophen (NORCO/VICODIN) 5-325 MG tablet One tablet every six hours for pain.  Limit 7 days.  . metFORMIN (GLUCOPHAGE) 500 MG tablet Take 500-1,000 mg by mouth 2 (two) times daily with a meal.  Takes 1000 mg in the morning and 500 mg in the evening  . methotrexate (RHEUMATREX) 2.5 MG tablet   . silver sulfADIAZINE (SILVADENE) 1 % cream Apply 3-4 times daily to area  . sulfamethoxazole-trimethoprim (BACTRIM DS,SEPTRA DS) 800-160 MG tablet Take 1 tablet by mouth 2 (two) times daily.   No current facility-administered medications for this visit. (Other)      REVIEW OF SYSTEMS: ROS    Positive for: Endocrine, Eyes   Negative for: Constitutional, Gastrointestinal, Neurological, Skin, Genitourinary, Musculoskeletal,  HENT, Cardiovascular, Respiratory, Psychiatric, Allergic/Imm, Heme/Lymph   Last edited by Zenovia Jordan, LPN on [id number]  [id number] AM. (History)       ALLERGIES No Known Allergies  PAST MEDICAL HISTORY Past Medical History:  Diagnosis Date  . Diabetes mellitus without complication [facility])    Past Surgical History:  Procedure Laterality Date  . COLONOSCOPY N/A 09/27/2012   Procedure: COLONOSCOPY;  Surgeon: Rogene Houston, MD;  [facility];  Service: Endoscopy;  Laterality: N/A;  1030  . Dilation and Currettage    . TUBAL LIGATION      FAMILY HISTORY Family History  Problem Relation Age of Onset  . Diabetes Mother   . Heart disease Mother   . Diabetes Father   . Hypertension Brother   . Graves' disease Daughter   . Colon cancer Neg Hx     SOCIAL HISTORY Social History   Tobacco Use  . Smoking status: Former Smoker    Packs/day: 0.50    Years: 6.00    Pack years: 3.00    Types: Cigarettes  . Smokeless tobacco: Never Used  Substance Use Topics  . Alcohol use: No  . Drug use: No         OPHTHALMIC EXAM:  Base Eye Exam    Visual Acuity (Snellen - Linear)      Right Left   Dist [place] 20/20 -1 20/25   Dist ph [place] NI NI       Tonometry (Tonopen, 9:08 AM)      Right Left   Pressure 11 10       Pupils      Dark Light Shape React APD   Right 3 2 Round Brisk None   Left 3 2 Round Brisk None       Visual Fields (Counting fingers)      Left Right    Full Full       Extraocular Movement      Right Left    Full, Ortho Full, Ortho       Neuro/Psych    Oriented x3: Yes       Dilation    Both eyes: 1.0% Mydriacyl, 2.5% Phenylephrine @ 9:08 AM        Slit Lamp and Fundus Exam    Slit Lamp Exam      Right Left   Lids/Lashes Normal Normal   Conjunctiva/Sclera Trace Melanosis Trace Melanosis   Cornea trace Arcus, trace Punctate epithelial erosions trace Arcus, trace Punctate epithelial erosions   Anterior Chamber Deep and quiet Deep and  quiet   Iris Round and dilated, No NVI Round and dilated, No NVI   Lens 2+ Nuclear sclerosis, 2+ Cortical cataract 2+ Nuclear sclerosis, 2+ Cortical cataract   Vitreous trace Vitreous syneresis trace Vitreous syneresis       Fundus Exam      Right Left   Disc Pink and Sharp Pink and Sharp   C/D Ratio 0.3 0.3   Macula Flat,  Good foveal reflex, scattered Microaneurysms Flat, Good foveal reflex, scattered Microaneurysms   Vessels Vascular attenuation, Tortuous Vascular attenuation, Tortuous   Periphery Attached, 360 MA, rare DBH Attached, 360 MA/DBH, mild WWP temporally          IMAGING AND PROCEDURES  Imaging and Procedures for @TODAY @  OCT, Retina - OU - Both Eyes       Right Eye Quality was good. Central Foveal Thickness: 293. Progression has no prior data. Findings include normal foveal contour, intraretinal fluid, no SRF (Mild Focal edema temporal macula).   Left Eye Quality was good. Central Foveal Thickness: 303. Progression has no prior data. Findings include normal foveal contour, no SRF, intraretinal fluid, vitreomacular adhesion  (Trace cystic changes temporal macula).   Notes *Images captured and stored on drive  Diagnosis / Impression:  NFP, no SRF, +IRF OU OD: mild focal edema temporal macula OS: trace cystic changes temporal macula  Clinical management:  See below  Abbreviations: NFP - Normal foveal profile. CME - cystoid macular edema. PED - pigment epithelial detachment. IRF - intraretinal fluid. SRF - subretinal fluid. EZ - ellipsoid zone. ERM - epiretinal membrane. ORA - outer retinal atrophy. ORT - outer retinal tubulation. SRHM - subretinal hyper-reflective material        Fluorescein Angiography Optos (Transit OS)       Right Eye   Progression has no prior data. Early phase findings include microaneurysm, vascular perfusion defect. Mid/Late phase findings include leakage, microaneurysm, vascular perfusion defect (No NV).   Left  Eye   Progression has no prior data. Early phase findings include microaneurysm, vascular perfusion defect, retinal neovascularization. Mid/Late phase findings include microaneurysm, retinal neovascularization, vascular perfusion defect, leakage (Scattered, foci of NV mostly nasal hemisphere).   Notes **Images stored on drive**  Impression: OD: severe NPDR OS: PDR Late leaking MA OU  Vascular perfusion defect OU (OS>OD) +NV OS                  ASSESSMENT/PLAN:    ICD-10-CM   1. Proliferative diabetic retinopathy of left eye with macular edema associated with type 2 diabetes mellitus ([place])  [id number]   2. Severe nonproliferative diabetic retinopathy of right eye with macular edema associated with type 2 diabetes mellitus ([place])  E11.3411   3. Retinal edema  H35.81 OCT, Retina - OU - Both Eyes  4. Hypertensive retinopathy of both eyes  H35.033 Fluorescein Angiography Optos (Transit OS)  5. Essential hypertension  I10   6. Combined forms of age-related cataract of both eyes  H25.813     1-3. Proliferative diabetic retinopathy w/ DME OS  Severe nonproliferative retinopathy w/ DME OD - The incidence, risk factors for progression, natural history and treatment options for diabetic retinopathy were discussed with patient.   - The need for close monitoring of blood glucose, blood pressure, and serum lipids, avoiding cigarette or any type of tobacco, and the need for long term follow up was also discussed with patient. - exam shows 360 MA OU - FA today (12.23.20) shows Late leaking MA OU; Vascular perfusion defect OU (OS>OD); +NV OS - discussed findings and prognosis - recommend PRP OS - pt wishes to proceed with laser treatment at next visit - minimal non-central IRF w/ BCVA 20/20 OD, 20/25 OS -- will hold off on anti-VEGF for now - f/u in 3-4 wks for DFE/OCT, PRP OS  4,5. Hypertensive retinopathy OU - discussed importance of tight BP control - monitor  6. Mixed form age  related cataracts  OU  - The symptoms of cataract, surgical options, and treatments and risks were discussed with patient. - discussed diagnosis and progression - not yet visually significant - monitor for now   Ophthalmic Meds Ordered this visit:  No orders of the defined types were placed in this encounter.      Return in 4 weeks (on 08/22/2019) for 4 wks - f/u PDR OS -- DFE, OCT, possible PRP OS.  There are no Patient Instructions on file for this visit.   Explained the diagnoses, plan, and follow up with the patient and they expressed understanding.  Patient expressed understanding of the importance of proper follow up care.   Gardiner Sleeper, M.D., Ph.D. Diseases & Surgery of the Retina and Vitreous [facility]  I have reviewed the above documentation for accuracy and completeness, and I agree with the above. Gardiner Sleeper, M.D., Ph.D. 07/26/19 11:42 AM   Abbreviations: M myopia (nearsighted); A astigmatism; H hyperopia (farsighted); P presbyopia; Mrx spectacle prescription;  CTL contact lenses; OD right eye; OS left eye; OU both eyes  XT exotropia; ET esotropia; PEK punctate epithelial keratitis; PEE punctate epithelial erosions; DES dry eye syndrome; MGD meibomian gland dysfunction; ATs artificial tears; PFAT's preservative free artificial tears; [place] nuclear sclerotic cataract; PSC posterior subcapsular cataract; ERM epi-retinal membrane; PVD posterior vitreous detachment; RD retinal detachment; DM diabetes mellitus; DR diabetic retinopathy; NPDR non-proliferative diabetic retinopathy; PDR proliferative diabetic retinopathy; CSME clinically significant macular edema; DME diabetic macular edema; dbh dot blot hemorrhages; CWS cotton wool spot; POAG primary open angle glaucoma; C/D cup-to-disc ratio; HVF humphrey visual field; GVF goldmann visual field; OCT optical coherence tomography; IOP intraocular pressure; BRVO Branch retinal vein occlusion; CRVO central  retinal vein occlusion; CRAO central retinal artery occlusion; BRAO branch retinal artery occlusion; RT retinal tear; SB scleral buckle; PPV pars plana vitrectomy; VH Vitreous hemorrhage; PRP panretinal laser photocoagulation; IVK intravitreal kenalog; VMT vitreomacular traction; MH Macular hole;  NVD neovascularization of the disc; NVE neovascularization elsewhere; AREDS age related eye disease study; ARMD age related macular degeneration; POAG primary open angle glaucoma; EBMD epithelial/anterior basement membrane dystrophy; ACIOL anterior chamber intraocular lens; IOL intraocular lens; PCIOL posterior chamber intraocular lens; Phaco/IOL phacoemulsification with intraocular lens placement; [place] photorefractive keratectomy; LASIK laser assisted in situ keratomileusis; HTN hypertension; DM diabetes mellitus; COPD chronic obstructive pulmonary disease

## 2019-07-25 ENCOUNTER — Other Ambulatory Visit: Payer: Self-pay

## 2019-07-25 ENCOUNTER — Encounter (INDEPENDENT_AMBULATORY_CARE_PROVIDER_SITE_OTHER): Payer: Self-pay | Admitting: Ophthalmology

## 2019-07-25 ENCOUNTER — Ambulatory Visit (INDEPENDENT_AMBULATORY_CARE_PROVIDER_SITE_OTHER): Payer: BC Managed Care – PPO | Admitting: Ophthalmology

## 2019-07-25 DIAGNOSIS — I10 Essential (primary) hypertension: Secondary | ICD-10-CM

## 2019-07-25 DIAGNOSIS — H35033 Hypertensive retinopathy, bilateral: Secondary | ICD-10-CM | POA: Diagnosis not present

## 2019-07-25 DIAGNOSIS — E113512 Type 2 diabetes mellitus with proliferative diabetic retinopathy with macular edema, left eye: Secondary | ICD-10-CM | POA: Diagnosis not present

## 2019-07-25 DIAGNOSIS — E113411 Type 2 diabetes mellitus with severe nonproliferative diabetic retinopathy with macular edema, right eye: Secondary | ICD-10-CM | POA: Diagnosis not present

## 2019-07-25 DIAGNOSIS — H3581 Retinal edema: Secondary | ICD-10-CM | POA: Diagnosis not present

## 2019-07-25 DIAGNOSIS — H25813 Combined forms of age-related cataract, bilateral: Secondary | ICD-10-CM

## 2019-08-04 ENCOUNTER — Other Ambulatory Visit: Payer: Self-pay

## 2019-08-06 MED ORDER — HYDROCODONE-ACETAMINOPHEN 5-325 MG PO TABS: ORAL_TABLET | ORAL | 0 refills | Status: DC

## 2019-08-14 ENCOUNTER — Encounter: Payer: Self-pay | Admitting: Orthopaedic Surgery

## 2019-08-14 ENCOUNTER — Ambulatory Visit: Payer: BC Managed Care – PPO | Admitting: Orthopaedic Surgery

## 2019-08-14 ENCOUNTER — Other Ambulatory Visit: Payer: Self-pay

## 2019-08-14 DIAGNOSIS — G8929 Other chronic pain: Secondary | ICD-10-CM | POA: Diagnosis not present

## 2019-08-14 DIAGNOSIS — M25511 Pain in right shoulder: Secondary | ICD-10-CM | POA: Diagnosis not present

## 2019-08-14 NOTE — Progress Notes (Signed)
PROCEDURE NOTE:  The patient request injection, verbal consent was obtained.  The right shoulder was prepped appropriately after time out was performed.   Sterile technique was observed and injection of 1 cc of Depo-Medrol 40 mg with several cc's of plain xylocaine. Anesthesia was provided by ethyl chloride and a 20-gauge needle was used to inject the shoulder area. A posterior approach was used.  The injection was tolerated well.  A band aid dressing was applied.  The patient was advised to apply ice later today and tomorrow to the injection sight as needed.  Return in six weeks.  Electronically Signed Sanjuana Kava, MD 1/12/20218:06 AM

## 2019-08-22 ENCOUNTER — Encounter (INDEPENDENT_AMBULATORY_CARE_PROVIDER_SITE_OTHER): Payer: BC Managed Care – PPO | Admitting: Ophthalmology

## 2019-09-06 ENCOUNTER — Telehealth: Payer: Self-pay | Admitting: Orthopaedic Surgery

## 2019-09-06 MED ORDER — HYDROCODONE-ACETAMINOPHEN 5-325 MG PO TABS: ORAL_TABLET | ORAL | 0 refills | Status: DC

## 2019-09-06 NOTE — Telephone Encounter (Signed)
Patient called for refill: HYDROcodone-acetaminophen (NORCO/VICODIN) 5-325 MG tablet  [facility], [place]

## 2019-09-25 ENCOUNTER — Ambulatory Visit: Payer: BC Managed Care – PPO | Admitting: Orthopaedic Surgery

## 2019-09-25 ENCOUNTER — Encounter: Payer: Self-pay | Admitting: Orthopaedic Surgery

## 2019-09-25 ENCOUNTER — Other Ambulatory Visit: Payer: Self-pay

## 2019-09-25 VITALS — Ht 64.0 in | Wt 210.0 lb

## 2019-09-25 DIAGNOSIS — Z0189 Encounter for other specified special examinations: Secondary | ICD-10-CM | POA: Diagnosis not present

## 2019-09-25 DIAGNOSIS — M25511 Pain in right shoulder: Secondary | ICD-10-CM

## 2019-09-25 DIAGNOSIS — G8929 Other chronic pain: Secondary | ICD-10-CM | POA: Diagnosis not present

## 2019-09-25 NOTE — Progress Notes (Signed)
PROCEDURE NOTE:  The patient request injection, verbal consent was obtained.  The right shoulder was prepped appropriately after time out was performed.   Sterile technique was observed and injection of 1 cc of Depo-Medrol 40 mg with several cc's of plain xylocaine. Anesthesia was provided by ethyl chloride and a 20-gauge needle was used to inject the shoulder area. A posterior approach was used.  The injection was tolerated well.  A band aid dressing was applied.  The patient was advised to apply ice later today and tomorrow to the injection sight as needed.  See in six weeks.  Call if any problem.  Precautions discussed.   Electronically Signed Sanjuana Kava, MD 2/23/20218:11 AM

## 2019-09-26 DIAGNOSIS — Z043 Encounter for examination and observation following other accident: Secondary | ICD-10-CM | POA: Diagnosis not present

## 2019-09-26 DIAGNOSIS — E785 Hyperlipidemia, unspecified: Secondary | ICD-10-CM | POA: Diagnosis not present

## 2019-09-26 DIAGNOSIS — Z713 Dietary counseling and surveillance: Secondary | ICD-10-CM | POA: Diagnosis not present

## 2019-10-01 ENCOUNTER — Other Ambulatory Visit: Payer: Self-pay | Admitting: Orthopaedic Surgery

## 2019-10-02 MED ORDER — HYDROCODONE-ACETAMINOPHEN 5-325 MG PO TABS: ORAL_TABLET | ORAL | 0 refills | Status: DC

## 2019-10-09 DIAGNOSIS — N760 Acute vaginitis: Secondary | ICD-10-CM | POA: Diagnosis not present

## 2019-10-15 ENCOUNTER — Ambulatory Visit (INDEPENDENT_AMBULATORY_CARE_PROVIDER_SITE_OTHER): Payer: BC Managed Care – PPO | Admitting: Obstetrics & Gynecology

## 2019-10-15 ENCOUNTER — Encounter: Payer: Self-pay | Admitting: Obstetrics & Gynecology

## 2019-10-15 ENCOUNTER — Other Ambulatory Visit (HOSPITAL_COMMUNITY)
Admission: RE | Admit: 2019-10-15 | Discharge: 2019-10-15 | Disposition: A | Discharge status: Home / Self Care | Payer: BC Managed Care – PPO | Source: Ambulatory Visit | Attending: Obstetrics & Gynecology | Admitting: Obstetrics & Gynecology

## 2019-10-15 ENCOUNTER — Other Ambulatory Visit: Payer: Self-pay

## 2019-10-15 VITALS — BP 111/66 | HR 77 | Ht 64.0 in | Wt 212.0 lb

## 2019-10-15 DIAGNOSIS — Z01419 Encounter for gynecological examination (general) (routine) without abnormal findings: Secondary | ICD-10-CM

## 2019-10-15 NOTE — Progress Notes (Signed)
Subjective:     Brooke Sawyer is a 59 y.o. female here for a routine exam.  Patient's last menstrual period was 05/25/2016. G2P2 Birth Control Method:  Post menopausal Menstrual Calendar(currently): none  Current complaints: none.   Current acute medical issues:  RA   Recent Gynecologic History Patient's last menstrual period was 05/25/2016. Last Pap: 2019 Last mammogram: 2020,    Past Medical History:  Diagnosis Date  . Diabetes mellitus without complication [facility])     Past Surgical History:  Procedure Laterality Date  . COLONOSCOPY N/A 09/27/2012   Procedure: COLONOSCOPY;  Surgeon: Rogene Houston, MD;  [facility];  Service: Endoscopy;  Laterality: N/A;  1030  . Dilation and Currettage    . TUBAL LIGATION      OB History    Gravida  2   Para  2   Term      Preterm      AB      Living  2     SAB      TAB      Ectopic      Multiple      Live Births              Social History   Socioeconomic History  . Marital status: Married    Spouse name: Not on file  . Number of children: Not on file  . Years of education: Not on file  . Highest education level: Not on file  Occupational History  . Not on file  Tobacco Use  . Smoking status: Former Smoker    Packs/day: 0.50    Years: 6.00    Pack years: 3.00    Types: Cigarettes  . Smokeless tobacco: Never Used  Substance and Sexual Activity  . Alcohol use: No  . Drug use: No  . Sexual activity: Yes    Birth control/protection: None  Other Topics Concern  . Not on file  Social History Narrative  . Not on file   Social Determinants of Health   Financial Resource Strain:   . Difficulty of Paying Living Expenses:   Food Insecurity:   . Worried About Charity fundraiser in the Last Year:   . Arboriculturist in the Last Year:   Transportation Needs:   . Film/video editor (Medical):   Marland Kitchen Lack of Transportation (Non-Medical):   Physical Activity:   . Days of Exercise per Week:    . Minutes of Exercise per Session:   Stress:   . Feeling of Stress :   Social Connections:   . Frequency of Communication with Friends and Family:   . Frequency of Social Gatherings with Friends and Family:   . Attends Religious Services:   . Active Member of Clubs or Organizations:   . Attends Archivist Meetings:   Marland Kitchen Marital Status:     Family History  Problem Relation Age of Onset  . Diabetes Mother   . Heart disease Mother   . Diabetes Father   . Hypertension Brother   . Graves' disease Daughter   . Colon cancer Neg Hx      Current Outpatient Medications:  .  aspirin EC 81 MG tablet, Take 81 mg by mouth daily., Disp: , Rfl:  .  atorvastatin (LIPITOR) 20 MG tablet, Take 20 mg at bedtime by mouth., Disp: , Rfl: 3 .  canagliflozin (INVOKANA) 300 MG TABS tablet, Take 300 mg by mouth daily before breakfast., Disp: ,  Rfl:  .  cholecalciferol (VITAMIN D) 1000 UNITS tablet, Take 1,000 Units by mouth daily., Disp: , Rfl:  .  Cyanocobalamin (B-12 PO), Take 1 tablet by mouth daily., Disp: , Rfl:  .  folic acid (FOLVITE) 1 MG tablet, Take 1 mg by mouth daily., Disp: , Rfl: 4 .  glipiZIDE (GLUCOTROL) 5 MG tablet, Take by mouth daily before breakfast., Disp: , Rfl:  .  HYDROcodone-acetaminophen (NORCO/VICODIN) 5-325 MG tablet, One tablet every six hours for pain.  Limit 7 days., Disp: 28 tablet, Rfl: 0 .  metFORMIN (GLUCOPHAGE) 500 MG tablet, Take 500-1,000 mg by mouth 2 (two) times daily with a meal. Takes 1000 mg in the morning and 500 mg in the evening, Disp: , Rfl:  .  methotrexate (RHEUMATREX) 2.5 MG tablet, , Disp: , Rfl:  .  hydroxychloroquine (PLAQUENIL) 200 MG tablet, One tab twice a day for 30 days for rheumatoid arthritis, 60 tabs  with 11 refills, Disp: , Rfl:   Review of Systems  Review of Systems  Constitutional: Negative for fever, chills, weight loss, malaise/fatigue and diaphoresis.  HENT: Negative for hearing loss, ear pain, nosebleeds, congestion, sore  throat, neck pain, tinnitus and ear discharge.   Eyes: Negative for blurred vision, double vision, photophobia, pain, discharge and redness.  Respiratory: Negative for cough, hemoptysis, sputum production, shortness of breath, wheezing and stridor.   Cardiovascular: Negative for chest pain, palpitations, orthopnea, claudication, leg swelling and PND.  Gastrointestinal: negative for abdominal pain. Negative for heartburn, nausea, vomiting, diarrhea, constipation, blood in stool and melena.  Genitourinary: Negative for dysuria, urgency, frequency, hematuria and flank pain.  Musculoskeletal: Negative for myalgias, back pain, joint pain and falls.  Skin: Negative for itching and rash.  Neurological: Negative for dizziness, tingling, tremors, sensory change, speech change, focal weakness, seizures, loss of consciousness, weakness and headaches.  Endo/Heme/Allergies: Negative for environmental allergies and polydipsia. Does not bruise/bleed easily.  Psychiatric/Behavioral: Negative for depression, suicidal ideas, hallucinations, memory loss and substance abuse. The patient is not nervous/anxious and does not have insomnia.        Objective:  Blood pressure 111/66, pulse 77, height 5\' 4"  (1.626 m), weight 212 lb (96.2 kg), last menstrual period 05/25/2016.   Physical Exam  Vitals reviewed. Constitutional: She is oriented to person, place, and time. She appears well-developed and well-nourished.  HENT:  Head: Normocephalic and atraumatic.        Right Ear: External ear normal.  Left Ear: External ear normal.  Nose: Nose normal.  Mouth/Throat: Oropharynx is clear and moist.  Eyes: Conjunctivae and EOM are normal. Pupils are equal, round, and reactive to light. Right eye exhibits no discharge. Left eye exhibits no discharge. No scleral icterus.  Neck: Normal range of motion. Neck supple. No tracheal deviation present. No thyromegaly present.  Cardiovascular: Normal rate, regular rhythm, normal heart  sounds and intact distal pulses.  Exam reveals no gallop and no friction rub.   No murmur heard. Respiratory: Effort normal and breath sounds normal. No respiratory distress. She has no wheezes. She has no rales. She exhibits no tenderness.  GI: Soft. Bowel sounds are normal. She exhibits no distension and no mass. There is no tenderness. There is no rebound and no guarding.  Genitourinary:  Breasts no masses skin changes or nipple changes bilaterally      Vulva is normal without lesions Vagina is pink moist without discharge Cervix normal in appearance and pap is done Uterus is normal size shape and contour Adnexa is negative with  normal sized ovaries  {Rectal    hemoccult negative, normal tone, no masses  Musculoskeletal: Normal range of motion. She exhibits no edema and no tenderness.  Neurological: She is alert and oriented to person, place, and time. She has normal reflexes. She displays normal reflexes. No cranial nerve deficit. She exhibits normal muscle tone. Coordination normal.  Skin: Skin is warm and dry. No rash noted. No erythema. No pallor.  Psychiatric: She has a normal mood and affect. Her behavior is normal. Judgment and thought content normal.       Medications Ordered at today's visit: No orders of the defined types were placed in this encounter.   Other orders placed at today's visit: No orders of the defined types were placed in this encounter.     Assessment:    Healthy female exam.    Plan:    Mammogram ordered. Follow up in: 3 years.     Return in about 3 years (around 10/15/2022) for yearly.

## 2019-10-19 LAB — CYTOLOGY - PAP
Chlamydia: NEGATIVE
Comment: NEGATIVE
Comment: NEGATIVE
Comment: NEGATIVE
Comment: NEGATIVE
Comment: NORMAL
Diagnosis: NEGATIVE
HPV 16: NEGATIVE
HPV 18 / 45: NEGATIVE
High risk HPV: POSITIVE — AB
Neisseria Gonorrhea: NEGATIVE

## 2019-10-23 ENCOUNTER — Encounter (INDEPENDENT_AMBULATORY_CARE_PROVIDER_SITE_OTHER): Payer: BC Managed Care – PPO | Admitting: Ophthalmology

## 2019-10-29 ENCOUNTER — Other Ambulatory Visit: Payer: Self-pay | Admitting: Orthopaedic Surgery

## 2019-10-29 MED ORDER — HYDROCODONE-ACETAMINOPHEN 5-325 MG PO TABS: ORAL_TABLET | ORAL | 0 refills | Status: DC

## 2019-11-06 ENCOUNTER — Ambulatory Visit (INDEPENDENT_AMBULATORY_CARE_PROVIDER_SITE_OTHER): Payer: BC Managed Care – PPO | Admitting: Orthopaedic Surgery

## 2019-11-06 ENCOUNTER — Other Ambulatory Visit: Payer: Self-pay

## 2019-11-06 ENCOUNTER — Encounter: Payer: Self-pay | Admitting: Orthopaedic Surgery

## 2019-11-06 DIAGNOSIS — G8929 Other chronic pain: Secondary | ICD-10-CM

## 2019-11-06 DIAGNOSIS — M25511 Pain in right shoulder: Secondary | ICD-10-CM

## 2019-11-06 NOTE — Progress Notes (Signed)
PROCEDURE NOTE:  The patient request injection, verbal consent was obtained.  The right shoulder was prepped appropriately after time out was performed.   Sterile technique was observed and injection of 1 cc of Depo-Medrol 40 mg with several cc's of plain xylocaine. Anesthesia was provided by ethyl chloride and a 20-gauge needle was used to inject the shoulder area. A posterior approach was used.  The injection was tolerated well.  A band aid dressing was applied.  The patient was advised to apply ice later today and tomorrow to the injection sight as needed.  Return in six weeks.  Call if any problem.  Precautions discussed.   Electronically Signed Sanjuana Kava, MD 4/6/20218:06 AM

## 2019-12-02 ENCOUNTER — Other Ambulatory Visit: Payer: Self-pay | Admitting: Orthopaedic Surgery

## 2019-12-03 MED ORDER — HYDROCODONE-ACETAMINOPHEN 5-325 MG PO TABS: ORAL_TABLET | ORAL | 0 refills | Status: DC

## 2019-12-18 ENCOUNTER — Other Ambulatory Visit: Payer: Self-pay

## 2019-12-18 ENCOUNTER — Encounter: Payer: Self-pay | Admitting: Orthopaedic Surgery

## 2019-12-18 ENCOUNTER — Ambulatory Visit: Payer: BC Managed Care – PPO | Admitting: Orthopaedic Surgery

## 2019-12-18 VITALS — Temp 97.1°F

## 2019-12-18 DIAGNOSIS — G8929 Other chronic pain: Secondary | ICD-10-CM | POA: Diagnosis not present

## 2019-12-18 DIAGNOSIS — M25511 Pain in right shoulder: Secondary | ICD-10-CM

## 2019-12-18 NOTE — Progress Notes (Signed)
PROCEDURE NOTE:  The patient request injection, verbal consent was obtained.  The right shoulder was prepped appropriately after time out was performed.   Sterile technique was observed and injection of 1 cc of Depo-Medrol 40 mg with several cc's of plain xylocaine. Anesthesia was provided by ethyl chloride and a 20-gauge needle was used to inject the shoulder area. A posterior approach was used.  The injection was tolerated well.  A band aid dressing was applied.  The patient was advised to apply ice later today and tomorrow to the injection sight as needed.  Return in six weeks.  Electronically Signed Sanjuana Kava, MD 5/18/20218:15 AM

## 2020-01-09 ENCOUNTER — Other Ambulatory Visit: Payer: Self-pay | Admitting: Orthopaedic Surgery

## 2020-01-10 MED ORDER — HYDROCODONE-ACETAMINOPHEN 5-325 MG PO TABS: ORAL_TABLET | ORAL | 0 refills | Status: DC

## 2020-01-29 ENCOUNTER — Encounter: Payer: Self-pay | Admitting: Orthopaedic Surgery

## 2020-01-29 ENCOUNTER — Other Ambulatory Visit: Payer: Self-pay

## 2020-01-29 ENCOUNTER — Ambulatory Visit: Payer: BC Managed Care – PPO | Admitting: Orthopaedic Surgery

## 2020-01-29 DIAGNOSIS — G8929 Other chronic pain: Secondary | ICD-10-CM | POA: Diagnosis not present

## 2020-01-29 DIAGNOSIS — M25511 Pain in right shoulder: Secondary | ICD-10-CM

## 2020-01-29 NOTE — Progress Notes (Signed)
PROCEDURE NOTE:  The patient request injection, verbal consent was obtained.  The right shoulder was prepped appropriately after time out was performed.   Sterile technique was observed and injection of 1 cc of Depo-Medrol 40 mg with several cc's of plain xylocaine. Anesthesia was provided by ethyl chloride and a 20-gauge needle was used to inject the shoulder area. A posterior approach was used.  The injection was tolerated well.  A band aid dressing was applied.  The patient was advised to apply ice later today and tomorrow to the injection sight as needed.  Return in six weeks.  Electronically Signed Sanjuana Kava, MD 6/29/20218:08 AM

## 2020-02-07 ENCOUNTER — Telehealth: Payer: Self-pay | Admitting: Orthopaedic Surgery

## 2020-02-07 MED ORDER — HYDROCODONE-ACETAMINOPHEN 5-325 MG PO TABS: ORAL_TABLET | ORAL | 0 refills | Status: DC

## 2020-02-07 NOTE — Telephone Encounter (Signed)
Patient requests refill on Hydrocodone/Acetaminophen 5-325  Mgs.  Qty  28  Sig: One tablet every six hours for pain. Limit 7 days.  [facility]

## 2020-03-11 ENCOUNTER — Ambulatory Visit: Payer: BC Managed Care – PPO | Admitting: Orthopaedic Surgery

## 2020-03-11 ENCOUNTER — Encounter: Payer: Self-pay | Admitting: Orthopaedic Surgery

## 2020-03-11 ENCOUNTER — Other Ambulatory Visit: Payer: Self-pay

## 2020-03-11 DIAGNOSIS — G8929 Other chronic pain: Secondary | ICD-10-CM

## 2020-03-11 DIAGNOSIS — M25511 Pain in right shoulder: Secondary | ICD-10-CM | POA: Diagnosis not present

## 2020-03-11 MED ORDER — HYDROCODONE-ACETAMINOPHEN 5-325 MG PO TABS: ORAL_TABLET | ORAL | 0 refills | Status: DC

## 2020-03-11 NOTE — Progress Notes (Signed)
PROCEDURE NOTE:  The patient request injection, verbal consent was obtained.  The right shoulder was prepped appropriately after time out was performed.   Sterile technique was observed and injection of 1 cc of Depo-Medrol 40 mg with several cc's of plain xylocaine. Anesthesia was provided by ethyl chloride and a 20-gauge needle was used to inject the shoulder area. A posterior approach was used.  The injection was tolerated well.  A band aid dressing was applied.  The patient was advised to apply ice later today and tomorrow to the injection sight as needed.  Return six weeks.  I have reviewed the [place] web site prior to prescribing narcotic medicine for this patient.   Electronically Signed Sanjuana Kava, MD 8/10/20218:19 AM

## 2020-04-13 ENCOUNTER — Other Ambulatory Visit: Payer: Self-pay | Admitting: Orthopaedic Surgery

## 2020-04-14 MED ORDER — HYDROCODONE-ACETAMINOPHEN 5-325 MG PO TABS: ORAL_TABLET | ORAL | 0 refills | Status: DC

## 2020-04-22 ENCOUNTER — Ambulatory Visit: Payer: BC Managed Care – PPO | Admitting: Orthopaedic Surgery

## 2020-04-22 ENCOUNTER — Other Ambulatory Visit: Payer: Self-pay

## 2020-04-22 ENCOUNTER — Encounter: Payer: Self-pay | Admitting: Orthopaedic Surgery

## 2020-04-22 DIAGNOSIS — M25511 Pain in right shoulder: Secondary | ICD-10-CM | POA: Diagnosis not present

## 2020-04-22 DIAGNOSIS — G8929 Other chronic pain: Secondary | ICD-10-CM

## 2020-04-22 NOTE — Progress Notes (Signed)
PROCEDURE NOTE:  The patient request injection, verbal consent was obtained.  The right shoulder was prepped appropriately after time out was performed.   Sterile technique was observed and injection of 1 cc of Depo-Medrol 40 mg with several cc's of plain xylocaine. Anesthesia was provided by ethyl chloride and a 20-gauge needle was used to inject the shoulder area. A posterior approach was used.  The injection was tolerated well.  A band aid dressing was applied.  The patient was advised to apply ice later today and tomorrow to the injection sight as needed.  Return in six weeks.  Call if any problem.  Precautions discussed.   Electronically Signed Sanjuana Kava, MD 9/21/20218:06 AM

## 2020-05-17 ENCOUNTER — Other Ambulatory Visit: Payer: Self-pay | Admitting: Orthopaedic Surgery

## 2020-05-19 MED ORDER — HYDROCODONE-ACETAMINOPHEN 5-325 MG PO TABS: ORAL_TABLET | ORAL | 0 refills | Status: DC

## 2020-06-03 ENCOUNTER — Ambulatory Visit: Payer: BC Managed Care – PPO | Admitting: Orthopaedic Surgery

## 2020-06-04 ENCOUNTER — Other Ambulatory Visit (HOSPITAL_COMMUNITY): Payer: Self-pay | Admitting: Obstetrics & Gynecology

## 2020-06-04 DIAGNOSIS — Z1231 Encounter for screening mammogram for malignant neoplasm of breast: Secondary | ICD-10-CM

## 2020-06-10 ENCOUNTER — Ambulatory Visit: Payer: BC Managed Care – PPO | Admitting: Orthopaedic Surgery

## 2020-06-10 ENCOUNTER — Encounter: Payer: Self-pay | Admitting: Orthopaedic Surgery

## 2020-06-10 ENCOUNTER — Other Ambulatory Visit: Payer: Self-pay

## 2020-06-10 DIAGNOSIS — M25511 Pain in right shoulder: Secondary | ICD-10-CM | POA: Diagnosis not present

## 2020-06-10 DIAGNOSIS — G8929 Other chronic pain: Secondary | ICD-10-CM | POA: Diagnosis not present

## 2020-06-10 MED ORDER — HYDROCODONE-ACETAMINOPHEN 5-325 MG PO TABS: ORAL_TABLET | ORAL | 0 refills | Status: DC

## 2020-06-10 NOTE — Progress Notes (Signed)
PROCEDURE NOTE:  The patient request injection, verbal consent was obtained.  The right shoulder was prepped appropriately after time out was performed.   Sterile technique was observed and injection of 1 cc of Depo-Medrol 40 mg with several cc's of plain xylocaine. Anesthesia was provided by ethyl chloride and a 20-gauge needle was used to inject the shoulder area. A posterior approach was used.  The injection was tolerated well.  A band aid dressing was applied.  The patient was advised to apply ice later today and tomorrow to the injection sight as needed.  Return in six weeks.  I have reviewed the [place] web site prior to prescribing narcotic medicine for this patient.   Electronically Signed Sanjuana Kava, MD 11/9/20218:05 AM

## 2020-07-14 ENCOUNTER — Ambulatory Visit (HOSPITAL_COMMUNITY)
Admission: RE | Admit: 2020-07-14 | Discharge: 2020-07-14 | Disposition: A | Discharge status: Home / Self Care | Payer: BC Managed Care – PPO | Source: Ambulatory Visit | Attending: Obstetrics & Gynecology | Admitting: Obstetrics & Gynecology

## 2020-07-14 ENCOUNTER — Other Ambulatory Visit: Payer: Self-pay | Admitting: Orthopaedic Surgery

## 2020-07-14 ENCOUNTER — Other Ambulatory Visit: Payer: Self-pay

## 2020-07-14 DIAGNOSIS — Z1231 Encounter for screening mammogram for malignant neoplasm of breast: Secondary | ICD-10-CM | POA: Diagnosis not present

## 2020-07-15 MED ORDER — HYDROCODONE-ACETAMINOPHEN 5-325 MG PO TABS: ORAL_TABLET | ORAL | 0 refills | Status: DC

## 2020-07-22 ENCOUNTER — Ambulatory Visit: Payer: BC Managed Care – PPO | Admitting: Orthopaedic Surgery

## 2020-08-21 ENCOUNTER — Telehealth: Payer: Self-pay | Admitting: Orthopaedic Surgery

## 2020-08-24 MED ORDER — HYDROCODONE-ACETAMINOPHEN 5-325 MG PO TABS: ORAL_TABLET | ORAL | 0 refills | Status: DC

## 2020-08-25 NOTE — Telephone Encounter (Signed)
Error

## 2020-09-15 DIAGNOSIS — Z0189 Encounter for other specified special examinations: Secondary | ICD-10-CM | POA: Diagnosis not present

## 2020-09-17 DIAGNOSIS — Z043 Encounter for examination and observation following other accident: Secondary | ICD-10-CM | POA: Diagnosis not present

## 2020-09-17 DIAGNOSIS — E785 Hyperlipidemia, unspecified: Secondary | ICD-10-CM | POA: Diagnosis not present

## 2020-09-17 DIAGNOSIS — Z713 Dietary counseling and surveillance: Secondary | ICD-10-CM | POA: Diagnosis not present

## 2020-09-29 ENCOUNTER — Telehealth: Payer: Self-pay | Admitting: Orthopaedic Surgery

## 2020-09-29 MED ORDER — HYDROCODONE-ACETAMINOPHEN 5-325 MG PO TABS: ORAL_TABLET | ORAL | 0 refills | Status: DC

## 2020-09-30 ENCOUNTER — Ambulatory Visit (INDEPENDENT_AMBULATORY_CARE_PROVIDER_SITE_OTHER): Payer: BC Managed Care – PPO | Admitting: Orthopaedic Surgery

## 2020-09-30 ENCOUNTER — Encounter: Payer: Self-pay | Admitting: Orthopaedic Surgery

## 2020-09-30 ENCOUNTER — Other Ambulatory Visit: Payer: Self-pay

## 2020-09-30 DIAGNOSIS — M25511 Pain in right shoulder: Secondary | ICD-10-CM | POA: Diagnosis not present

## 2020-09-30 DIAGNOSIS — G8929 Other chronic pain: Secondary | ICD-10-CM

## 2020-09-30 NOTE — Progress Notes (Signed)
PROCEDURE NOTE:  The patient request injection, verbal consent was obtained.  The right shoulder was prepped appropriately after time out was performed.   Sterile technique was observed and injection of 1 cc of Celestone 6 mg with several cc's of plain xylocaine. Anesthesia was provided by ethyl chloride and a 20-gauge needle was used to inject the shoulder area. A posterior approach was used.  The injection was tolerated well.  A band aid dressing was applied.  The patient was advised to apply ice later today and tomorrow to the injection sight as needed.  Return in six weeks.  Electronically Signed Sanjuana Kava, MD 3/1/20227:56 AM

## 2020-10-23 ENCOUNTER — Other Ambulatory Visit (HOSPITAL_COMMUNITY)
Admission: RE | Admit: 2020-10-23 | Discharge: 2020-10-23 | Disposition: A | Discharge status: Home / Self Care | Payer: BC Managed Care – PPO | Source: Ambulatory Visit | Attending: Obstetrics & Gynecology | Admitting: Obstetrics & Gynecology

## 2020-10-23 ENCOUNTER — Ambulatory Visit: Payer: BC Managed Care – PPO | Admitting: Obstetrics & Gynecology

## 2020-10-23 ENCOUNTER — Encounter: Payer: Self-pay | Admitting: Obstetrics & Gynecology

## 2020-10-23 ENCOUNTER — Other Ambulatory Visit: Payer: Self-pay

## 2020-10-23 VITALS — BP 110/64 | HR 74 | Ht 64.0 in | Wt 205.5 lb

## 2020-10-23 DIAGNOSIS — Z1212 Encounter for screening for malignant neoplasm of rectum: Secondary | ICD-10-CM | POA: Diagnosis not present

## 2020-10-23 DIAGNOSIS — Z01419 Encounter for gynecological examination (general) (routine) without abnormal findings: Secondary | ICD-10-CM

## 2020-10-23 DIAGNOSIS — Z1211 Encounter for screening for malignant neoplasm of colon: Secondary | ICD-10-CM

## 2020-10-23 LAB — HEMOCCULT GUIAC POC 1CARD (OFFICE): Fecal Occult Blood, POC: NEGATIVE

## 2020-10-23 NOTE — Progress Notes (Signed)
Subjective:     Brooke Sawyer is a 60 y.o. female here for a routine exam.  Patient's last menstrual period was 05/25/2016. G2P2 Birth Control Method:  BTL Menstrual Calendar(currently): amenorrheic  Current complaints: none.   Current acute medical issues:  none   Recent Gynecologic History Patient's last menstrual period was 05/25/2016. Last Pap: 2021,  normal Last mammogram: 07/2020,  normal  Past Medical History:  Diagnosis Date  . Diabetes mellitus without complication [facility])     Past Surgical History:  Procedure Laterality Date  . COLONOSCOPY N/A 09/27/2012   Procedure: COLONOSCOPY;  Surgeon: Rogene Houston, MD;  [facility];  Service: Endoscopy;  Laterality: N/A;  1030  . Dilation and Currettage    . TUBAL LIGATION      OB History    Gravida  2   Para  2   Term      Preterm      AB      Living  2     SAB      IAB      Ectopic      Multiple      Live Births              Social History   Socioeconomic History  . Marital status: Married    Spouse name: Not on file  . Number of children: Not on file  . Years of education: Not on file  . Highest education level: Not on file  Occupational History  . Not on file  Tobacco Use  . Smoking status: Former Smoker    Packs/day: 0.50    Years: 6.00    Pack years: 3.00    Types: Cigarettes  . Smokeless tobacco: Never Used  Vaping Use  . Vaping Use: Never used  Substance and Sexual Activity  . Alcohol use: No  . Drug use: No  . Sexual activity: Yes    Birth control/protection: Surgical    Comment: tubal  Other Topics Concern  . Not on file  Social History Narrative  . Not on file   Social Determinants of Health   Financial Resource Strain: Low Risk   . Difficulty of Paying Living Expenses: Not very hard  Food Insecurity: No Food Insecurity  . Worried About Charity fundraiser in the Last Year: Never true  . Ran Out of Food in the Last Year: Never true  Transportation Needs:  No Transportation Needs  . Lack of Transportation (Medical): No  . Lack of Transportation (Non-Medical): No  Physical Activity: Insufficiently Active  . Days of Exercise per Week: 7 days  . Minutes of Exercise per Session: 20 min  Stress: No Stress Concern Present  . Feeling of Stress : Only a little  Social Connections: Moderately Integrated  . Frequency of Communication with Friends and Family: More than three times a week  . Frequency of Social Gatherings with Friends and Family: Never  . Attends Religious Services: More than 4 times per year  . Active Member of Clubs or Organizations: No  . Attends Archivist Meetings: Never  . Marital Status: Married    Family History  Problem Relation Age of Onset  . Diabetes Mother   . Heart disease Mother   . Diabetes Father   . Hypertension Brother   . Graves' disease Daughter   . Colon cancer Neg Hx      Current Outpatient Medications:  .  aspirin EC 81 MG tablet, Take  81 mg by mouth daily., Disp: , Rfl:  .  atorvastatin (LIPITOR) 20 MG tablet, Take 20 mg at bedtime by mouth., Disp: , Rfl: 3 .  canagliflozin (INVOKANA) 300 MG TABS tablet, Take 300 mg by mouth daily before breakfast., Disp: , Rfl:  .  cholecalciferol (VITAMIN D) 1000 UNITS tablet, Take 1,000 Units by mouth daily., Disp: , Rfl:  .  Cyanocobalamin (B-12 PO), Take 1 tablet by mouth daily., Disp: , Rfl:  .  folic acid (FOLVITE) 1 MG tablet, Take 1 mg by mouth daily., Disp: , Rfl: 4 .  glipiZIDE (GLUCOTROL) 5 MG tablet, Take by mouth daily before breakfast., Disp: , Rfl:  .  HYDROcodone-acetaminophen (NORCO/VICODIN) 5-325 MG tablet, One tablet every six hours for pain.  Limit 7 days., Disp: 22 tablet, Rfl: 0 .  metFORMIN (GLUCOPHAGE) 500 MG tablet, Take 500-1,000 mg by mouth 2 (two) times daily with a meal. Takes 1000 mg in the morning and 500 mg in the evening, Disp: , Rfl:  .  methotrexate (RHEUMATREX) 2.5 MG tablet, , Disp: , Rfl:   Review of Systems  Review  of Systems  Constitutional: Negative for fever, chills, weight loss, malaise/fatigue and diaphoresis.  HENT: Negative for hearing loss, ear pain, nosebleeds, congestion, sore throat, neck pain, tinnitus and ear discharge.   Eyes: Negative for blurred vision, double vision, photophobia, pain, discharge and redness.  Respiratory: Negative for cough, hemoptysis, sputum production, shortness of breath, wheezing and stridor.   Cardiovascular: Negative for chest pain, palpitations, orthopnea, claudication, leg swelling and PND.  Gastrointestinal: negative for abdominal pain. Negative for heartburn, nausea, vomiting, diarrhea, constipation, blood in stool and melena.  Genitourinary: Negative for dysuria, urgency, frequency, hematuria and flank pain.  Musculoskeletal: Negative for myalgias, back pain, joint pain and falls.  Skin: Negative for itching and rash.  Neurological: Negative for dizziness, tingling, tremors, sensory change, speech change, focal weakness, seizures, loss of consciousness, weakness and headaches.  Endo/Heme/Allergies: Negative for environmental allergies and polydipsia. Does not bruise/bleed easily.  Psychiatric/Behavioral: Negative for depression, suicidal ideas, hallucinations, memory loss and substance abuse. The patient is not nervous/anxious and does not have insomnia.        Objective:  Blood pressure 110/64, pulse 74, height 5\' 4"  (1.626 m), weight 205 lb 8 oz (93.2 kg), last menstrual period 05/25/2016.   Physical Exam  Vitals reviewed. Constitutional: She is oriented to person, place, and time. She appears well-developed and well-nourished.  HENT:  Head: Normocephalic and atraumatic.        Right Ear: External ear normal.  Left Ear: External ear normal.  Nose: Nose normal.  Mouth/Throat: Oropharynx is clear and moist.  Eyes: Conjunctivae and EOM are normal. Pupils are equal, round, and reactive to light. Right eye exhibits no discharge. Left eye exhibits no  discharge. No scleral icterus.  Neck: Normal range of motion. Neck supple. No tracheal deviation present. No thyromegaly present.  Cardiovascular: Normal rate, regular rhythm, normal heart sounds and intact distal pulses.  Exam reveals no gallop and no friction rub.   No murmur heard. Respiratory: Effort normal and breath sounds normal. No respiratory distress. She has no wheezes. She has no rales. She exhibits no tenderness.  GI: Soft. Bowel sounds are normal. She exhibits no distension and no mass. There is no tenderness. There is no rebound and no guarding.  Genitourinary:  Breasts no masses skin changes or nipple changes bilaterally      Vulva is normal without lesions Vagina is pink moist without discharge  Cervix normal in appearance and pap is done Uterus is normal size shape and contour Adnexa is negative with normal sized ovaries  {Rectal    hemoccult negative, normal tone, no masses  Musculoskeletal: Normal range of motion. She exhibits no edema and no tenderness.  Neurological: She is alert and oriented to person, place, and time. She has normal reflexes. She displays normal reflexes. No cranial nerve deficit. She exhibits normal muscle tone. Coordination normal.  Skin: Skin is warm and dry. No rash noted. No erythema. No pallor.  Psychiatric: She has a normal mood and affect. Her behavior is normal. Judgment and thought content normal.       Medications Ordered at today's visit: No orders of the defined types were placed in this encounter.   Other orders placed at today's visit: No orders of the defined types were placed in this encounter.     Assessment:    Normal Gyn exam.    Plan:    Contraception: tubal ligation. Mammogram ordered. Follow up in: 3 years.   if Pap is normal   Return in about 1 year (around 10/23/2021) for yearly.

## 2020-10-23 NOTE — Addendum Note (Signed)
Addended by: Linton Rump on: 10/23/2020 12:14 PM   Modules accepted: Orders

## 2020-10-27 LAB — CYTOLOGY - PAP
Adequacy: ABSENT
Comment: NEGATIVE
Diagnosis: NEGATIVE
High risk HPV: NEGATIVE

## 2020-11-11 ENCOUNTER — Encounter: Payer: Self-pay | Admitting: Orthopaedic Surgery

## 2020-11-11 ENCOUNTER — Other Ambulatory Visit: Payer: Self-pay

## 2020-11-11 ENCOUNTER — Ambulatory Visit (INDEPENDENT_AMBULATORY_CARE_PROVIDER_SITE_OTHER): Payer: BC Managed Care – PPO | Admitting: Orthopaedic Surgery

## 2020-11-11 DIAGNOSIS — M25511 Pain in right shoulder: Secondary | ICD-10-CM

## 2020-11-11 DIAGNOSIS — G8929 Other chronic pain: Secondary | ICD-10-CM

## 2020-11-11 MED ORDER — HYDROCODONE-ACETAMINOPHEN 5-325 MG PO TABS: ORAL_TABLET | ORAL | 0 refills | Status: DC

## 2020-11-11 NOTE — Progress Notes (Signed)
PROCEDURE NOTE:  The patient request injection, verbal consent was obtained.  The right shoulder was prepped appropriately after time out was performed.   Sterile technique was observed and injection of 1 cc of Celestone 6 mg with several cc's of plain xylocaine. Anesthesia was provided by ethyl chloride and a 20-gauge needle was used to inject the shoulder area. A posterior approach was used.  The injection was tolerated well.  A band aid dressing was applied.  The patient was advised to apply ice later today and tomorrow to the injection sight as needed.  I have reviewed the [place] web site prior to prescribing narcotic medicine for this patient.  Return in six weeks.  Electronically Signed Sanjuana Kava, MD 4/12/20228:03 AM

## 2020-12-10 ENCOUNTER — Telehealth: Payer: Self-pay | Admitting: Orthopaedic Surgery

## 2020-12-11 MED ORDER — HYDROCODONE-ACETAMINOPHEN 5-325 MG PO TABS: ORAL_TABLET | ORAL | 0 refills | Status: DC

## 2020-12-23 ENCOUNTER — Other Ambulatory Visit: Payer: Self-pay

## 2020-12-23 ENCOUNTER — Ambulatory Visit (INDEPENDENT_AMBULATORY_CARE_PROVIDER_SITE_OTHER): Payer: BC Managed Care – PPO | Admitting: Orthopaedic Surgery

## 2020-12-23 ENCOUNTER — Encounter: Payer: Self-pay | Admitting: Orthopaedic Surgery

## 2020-12-23 DIAGNOSIS — G8929 Other chronic pain: Secondary | ICD-10-CM | POA: Diagnosis not present

## 2020-12-23 DIAGNOSIS — M25511 Pain in right shoulder: Secondary | ICD-10-CM

## 2020-12-23 MED ORDER — HYDROCODONE-ACETAMINOPHEN 5-325 MG PO TABS
ORAL_TABLET | ORAL | 0 refills | Status: DC
Start: 2020-12-23 — End: 2021-01-15

## 2020-12-23 NOTE — Progress Notes (Signed)
PROCEDURE NOTE:  The patient request injection, verbal consent was obtained.  The right shoulder was prepped appropriately after time out was performed.   Sterile technique was observed and injection of 1 cc of DepoMedrol 40 mg with several cc's of plain xylocaine. Anesthesia was provided by ethyl chloride and a 20-gauge needle was used to inject the shoulder area. A posterior approach was used.  The injection was tolerated well.  A band aid dressing was applied.  The patient was advised to apply ice later today and tomorrow to the injection sight as needed.  I have reviewed the [place] web site prior to prescribing narcotic medicine for this patient.   Call if any problem.  Precautions discussed.   Return in six weeks.  Electronically Signed Sanjuana Kava, MD 5/24/20228:10 AM

## 2021-01-15 ENCOUNTER — Telehealth: Payer: Self-pay | Admitting: Orthopaedic Surgery

## 2021-01-19 ENCOUNTER — Ambulatory Visit
Admission: EM | Admit: 2021-01-19 | Discharge: 2021-01-19 | Disposition: A | Discharge status: Home / Self Care | Payer: BC Managed Care – PPO | Attending: Family Medicine | Admitting: Family Medicine

## 2021-01-19 ENCOUNTER — Other Ambulatory Visit: Payer: Self-pay

## 2021-01-19 ENCOUNTER — Ambulatory Visit (INDEPENDENT_AMBULATORY_CARE_PROVIDER_SITE_OTHER): Payer: BC Managed Care – PPO

## 2021-01-19 DIAGNOSIS — S92912A Unspecified fracture of left toe(s), initial encounter for closed fracture: Secondary | ICD-10-CM

## 2021-01-19 DIAGNOSIS — S62617A Displaced fracture of proximal phalanx of left little finger, initial encounter for closed fracture: Secondary | ICD-10-CM | POA: Diagnosis not present

## 2021-01-19 DIAGNOSIS — R030 Elevated blood-pressure reading, without diagnosis of hypertension: Secondary | ICD-10-CM | POA: Diagnosis not present

## 2021-01-19 DIAGNOSIS — M79672 Pain in left foot: Secondary | ICD-10-CM

## 2021-01-19 DIAGNOSIS — W19XXXA Unspecified fall, initial encounter: Secondary | ICD-10-CM

## 2021-01-19 MED ORDER — HYDROCODONE-ACETAMINOPHEN 5-325 MG PO TABS: ORAL_TABLET | ORAL | 0 refills | Status: DC

## 2021-01-19 NOTE — ED Provider Notes (Signed)
[facility]   [id number] 01/19/21 Arrival Time: 9629  ASSESSMENT & PLAN:  1. Unspecified fracture of left toe(s), initial encounter for closed fracture   2. Elevated blood pressure reading in office without diagnosis of hypertension    I have personally viewed the imaging studies ordered this visit. 4th and 5th toe fractures of L foot.  Placed in post-op shoe.  Recommend:  Follow-up Information     [place].   Why: If worsening or failing to improve as anticipated. Contact information: [address] [place] [phone]                Reviewed expectations re: course of current medical issues. Questions answered. Outlined signs and symptoms indicating need for more acute intervention. Patient verbalized understanding. After Visit Summary given.  SUBJECTIVE: History from: patient. Brooke Sawyer is a 60 y.o. female who reports pain of distal L foot s/p fall a few d ago. Able to bear weight but with discomfort. Some swelling. No open wounds. No extremity sensation changes or weakness. Tylenol and Advil help.  Past Surgical History:  Procedure Laterality Date   COLONOSCOPY N/A 09/27/2012   Procedure: COLONOSCOPY;  Surgeon: Rogene Houston, MD;  [facility];  Service: Endoscopy;  Laterality: N/A;  1030   Dilation and Currettage     TUBAL LIGATION      Increased blood pressure noted today. Reports that she has not been treated for hypertension in the past. She reports no chest pain on exertion, no dyspnea on exertion, no swelling of ankles, no orthostatic dizziness or lightheadedness, no orthopnea or paroxysmal nocturnal dyspnea, and no palpitations.   OBJECTIVE:  Vitals:   01/19/21 1216  BP: (!) 148/78  Pulse: (!) 104  Resp: 20  Temp: 98.5 F (36.9 C)  SpO2: 97%    Mild tachycardia noted. General appearance: alert; no distress HEENT: [place]; AT Neck: supple with FROM Resp:  unlabored respirations Extremities: LLE: warm with well perfused appearance; fairly well localized moderate tenderness over left 4th and 5th toes; without gross deformities; swelling: minimal; bruising: none; no open wounds CV: brisk extremity capillary refill of LLE; 2+ DP pulse of LLE. Skin: warm and dry; no visible rashes Neurologic: gait normal but favors LLE; normal sensation and strength of LLE Psychological: alert and cooperative; normal mood and affect  Imaging: DG Foot Complete Left  Result Date: 01/19/2021 CLINICAL DATA:  Left foot pain and swelling after falling a few days prior. EXAM: LEFT FOOT - COMPLETE 3+ VIEW COMPARISON:  None. FINDINGS: The bones appear adequately mineralized. There are nondisplaced transverse fractures through the bases of the 4th and 5th proximal phalanges. No other fractures are identified. The alignment appears normal at the Lisfranc joint. There are mild degenerative changes at the 1st metatarsophalangeal joint. There are plantar and posterior calcaneal spurs. There is diffuse soft tissue swelling in the dorsal aspect of the forefoot, extending into the lower leg. No foreign bodies are identified. IMPRESSION: 1. Acute transverse extra-articular fractures of the 4th and 5th proximal phalanges. 2. Diffuse soft tissue swelling without foreign body. Electronically Signed   By: Richardean Sale M.D.   On: 01/19/2021 12:50      No Known Allergies  Past Medical History:  Diagnosis Date   Diabetes mellitus without complication ([place])    Social History   Socioeconomic History   Marital status: Married    Spouse name: Not on file   Number of  children: Not on file   Years of education: Not on file   Highest education level: Not on file  Occupational History   Not on file  Tobacco Use   Smoking status: Former    Packs/day: 0.50    Years: 6.00    Pack years: 3.00    Types: Cigarettes   Smokeless tobacco: Never  Vaping Use   Vaping Use: Never used   Substance and Sexual Activity   Alcohol use: No   Drug use: No   Sexual activity: Yes    Birth control/protection: Surgical    Comment: tubal  Other Topics Concern   Not on file  Social History Narrative   Not on file   Social Determinants of Health   Financial Resource Strain: Low Risk    Difficulty of Paying Living Expenses: Not very hard  Food Insecurity: No Food Insecurity   Worried About Running Out of Food in the Last Year: Never true   Ran Out of Food in the Last Year: Never true  Transportation Needs: No Transportation Needs   Lack of Transportation (Medical): No   Lack of Transportation (Non-Medical): No  Physical Activity: Insufficiently Active   Days of Exercise per Week: 7 days   Minutes of Exercise per Session: 20 min  Stress: No Stress Concern Present   Feeling of Stress : Only a little  Social Connections: Moderately Integrated   Frequency of Communication with Friends and Family: More than three times a week   Frequency of Social Gatherings with Friends and Family: Never   Attends Religious Services: More than 4 times per year   Active Member of Clubs or Organizations: No   Attends Archivist Meetings: Never   Marital Status: Married   Family History  Problem Relation Age of Onset   Diabetes Mother    Heart disease Mother    Diabetes Father    Hypertension Brother    Berenice Primas' disease Daughter    Colon cancer Neg Hx    Past Surgical History:  Procedure Laterality Date   COLONOSCOPY N/A 09/27/2012   Procedure: COLONOSCOPY;  Surgeon: Rogene Houston, MD;  [facility];  Service: Endoscopy;  Laterality: N/A;  1030   Dilation and Currettage     TUBAL LIGATION         Vanessa Kick, MD 01/19/21 1349

## 2021-01-19 NOTE — Discharge Instructions (Addendum)
Continue ibuprofen and Tylenol as needed.   Your blood pressure was noted to be elevated during your visit today. If you are currently taking medication for high blood pressure, please ensure you are taking this as directed. If you do not have a history of high blood pressure and your blood pressure remains persistently elevated, you may need to begin taking a medication at some point. You may return here within the next few days to recheck if unable to see your primary care provider or if you do not have a one.  BP (!) 148/78   Pulse (!) 104   Temp 98.5 F (36.9 C)   Resp 20   LMP 05/25/2016   SpO2 97%   BP Readings from Last 3 Encounters:  01/19/21 (!) 148/78  10/23/20 110/64  10/15/19 111/66

## 2021-01-19 NOTE — ED Triage Notes (Signed)
Pt had fall a few days ago and injured left foot , swelling noted

## 2021-01-21 ENCOUNTER — Other Ambulatory Visit: Payer: Self-pay | Admitting: Nurse Practitioner

## 2021-01-21 DIAGNOSIS — E113293 Type 2 diabetes mellitus with mild nonproliferative diabetic retinopathy without macular edema, bilateral: Secondary | ICD-10-CM | POA: Diagnosis not present

## 2021-01-21 LAB — HM DIABETES EYE EXAM

## 2021-01-27 DIAGNOSIS — E119 Type 2 diabetes mellitus without complications: Secondary | ICD-10-CM | POA: Diagnosis not present

## 2021-02-03 ENCOUNTER — Ambulatory Visit (INDEPENDENT_AMBULATORY_CARE_PROVIDER_SITE_OTHER): Payer: BLUE CROSS/BLUE SHIELD | Admitting: Orthopaedic Surgery

## 2021-02-03 ENCOUNTER — Encounter: Payer: Self-pay | Admitting: Orthopaedic Surgery

## 2021-02-03 ENCOUNTER — Other Ambulatory Visit: Payer: Self-pay

## 2021-02-03 VITALS — BP 124/69 | HR 67 | Ht 64.0 in | Wt 208.0 lb

## 2021-02-03 DIAGNOSIS — G8929 Other chronic pain: Secondary | ICD-10-CM

## 2021-02-03 DIAGNOSIS — M25511 Pain in right shoulder: Secondary | ICD-10-CM

## 2021-02-03 NOTE — Progress Notes (Signed)
PROCEDURE NOTE:  The patient request injection, verbal consent was obtained.  The right shoulder was prepped appropriately after time out was performed.   Sterile technique was observed and injection of 1 cc of Celestone 6mg  with several cc's of plain xylocaine. Anesthesia was provided by ethyl chloride and a 20-gauge needle was used to inject the shoulder area. A posterior approach was used.  The injection was tolerated well.  A band aid dressing was applied.  The patient was advised to apply ice later today and tomorrow to the injection sight as needed.   She also has recent fracture of the left fifth and fourth toes proximal phalanx.  She is in postop shoe.  Return in six weeks.  Call if any problem.  Precautions discussed.  Electronically Signed Sanjuana Kava, MD 7/5/20228:11 AM

## 2021-02-09 ENCOUNTER — Telehealth: Payer: Self-pay | Admitting: Orthopaedic Surgery

## 2021-02-09 DIAGNOSIS — E119 Type 2 diabetes mellitus without complications: Secondary | ICD-10-CM | POA: Diagnosis not present

## 2021-02-09 DIAGNOSIS — E785 Hyperlipidemia, unspecified: Secondary | ICD-10-CM | POA: Diagnosis not present

## 2021-02-12 MED ORDER — HYDROCODONE-ACETAMINOPHEN 5-325 MG PO TABS: ORAL_TABLET | ORAL | 0 refills | Status: DC

## 2021-03-04 DIAGNOSIS — M2550 Pain in unspecified joint: Secondary | ICD-10-CM | POA: Diagnosis not present

## 2021-03-17 ENCOUNTER — Ambulatory Visit (INDEPENDENT_AMBULATORY_CARE_PROVIDER_SITE_OTHER): Payer: BC Managed Care – PPO | Admitting: Orthopaedic Surgery

## 2021-03-17 ENCOUNTER — Encounter: Payer: Self-pay | Admitting: Orthopaedic Surgery

## 2021-03-17 ENCOUNTER — Other Ambulatory Visit: Payer: Self-pay

## 2021-03-17 VITALS — BP 130/65 | HR 73 | Ht 64.0 in | Wt 206.6 lb

## 2021-03-17 DIAGNOSIS — G8929 Other chronic pain: Secondary | ICD-10-CM | POA: Diagnosis not present

## 2021-03-17 DIAGNOSIS — M25511 Pain in right shoulder: Secondary | ICD-10-CM | POA: Diagnosis not present

## 2021-03-17 MED ORDER — HYDROCODONE-ACETAMINOPHEN 5-325 MG PO TABS: ORAL_TABLET | ORAL | 0 refills | Status: DC

## 2021-03-17 NOTE — Progress Notes (Signed)
PROCEDURE NOTE:  The patient request injection, verbal consent was obtained.  The right shoulder was prepped appropriately after time out was performed.   Sterile technique was observed and injection of 1 cc of Celestone 6 mg with several cc's of plain xylocaine. Anesthesia was provided by ethyl chloride and a 20-gauge needle was used to inject the shoulder area. A posterior approach was used.  The injection was tolerated well.  A band aid dressing was applied.  The patient was advised to apply ice later today and tomorrow to the injection sight as needed.   See as needed.  I have reviewed the [place] web site prior to prescribing narcotic medicine for this patient.  Electronically Signed Sanjuana Kava, MD 8/16/20228:05 AM

## 2021-03-18 DIAGNOSIS — N760 Acute vaginitis: Secondary | ICD-10-CM | POA: Diagnosis not present

## 2021-04-27 ENCOUNTER — Telehealth: Payer: Self-pay | Admitting: Orthopaedic Surgery

## 2021-04-27 ENCOUNTER — Telehealth: Payer: Self-pay | Admitting: Obstetrics & Gynecology

## 2021-04-27 MED ORDER — HYDROCODONE-ACETAMINOPHEN 5-325 MG PO TABS: ORAL_TABLET | ORAL | 0 refills | Status: DC

## 2021-04-27 NOTE — Telephone Encounter (Signed)
We are sending the referral in to [facility]

## 2021-04-27 NOTE — Telephone Encounter (Signed)
Pt calling to say that Dr. Elonda Husky was going to work on finding her a new rheumatologist since hers left  States he asked that she call to remind him of this  Please advise & call pt

## 2021-04-28 ENCOUNTER — Other Ambulatory Visit: Payer: Self-pay | Admitting: *Deleted

## 2021-04-28 DIAGNOSIS — M059 Rheumatoid arthritis with rheumatoid factor, unspecified: Secondary | ICD-10-CM

## 2021-05-25 ENCOUNTER — Encounter: Payer: Self-pay | Admitting: Nurse Practitioner

## 2021-05-25 ENCOUNTER — Ambulatory Visit (INDEPENDENT_AMBULATORY_CARE_PROVIDER_SITE_OTHER): Payer: 59 | Admitting: Nurse Practitioner

## 2021-05-25 ENCOUNTER — Other Ambulatory Visit: Payer: Self-pay

## 2021-05-25 ENCOUNTER — Encounter: Payer: Self-pay | Admitting: *Deleted

## 2021-05-25 VITALS — BP 121/68 | HR 78 | Temp 98.7°F | Resp 10 | Ht 64.0 in | Wt 209.0 lb

## 2021-05-25 DIAGNOSIS — E119 Type 2 diabetes mellitus without complications: Secondary | ICD-10-CM

## 2021-05-25 DIAGNOSIS — E11319 Type 2 diabetes mellitus with unspecified diabetic retinopathy without macular edema: Secondary | ICD-10-CM | POA: Insufficient documentation

## 2021-05-25 DIAGNOSIS — M75101 Unspecified rotator cuff tear or rupture of right shoulder, not specified as traumatic: Secondary | ICD-10-CM

## 2021-05-25 DIAGNOSIS — M059 Rheumatoid arthritis with rheumatoid factor, unspecified: Secondary | ICD-10-CM | POA: Diagnosis not present

## 2021-05-25 DIAGNOSIS — Z7689 Persons encountering health services in other specified circumstances: Secondary | ICD-10-CM

## 2021-05-25 DIAGNOSIS — E113512 Type 2 diabetes mellitus with proliferative diabetic retinopathy with macular edema, left eye: Secondary | ICD-10-CM | POA: Diagnosis not present

## 2021-05-25 DIAGNOSIS — E113411 Type 2 diabetes mellitus with severe nonproliferative diabetic retinopathy with macular edema, right eye: Secondary | ICD-10-CM

## 2021-05-25 NOTE — Progress Notes (Signed)
Subjective:    Patient ID: Brooke Sawyer, female    DOB: 16-Nov-1960, 60 y.o.   MRN: [id number]  HPI: Brooke Sawyer is a 60 y.o. female presenting for new patient visit to establish care.  Introduced to Designer, jewellery role and practice setting.  All questions answered.  Discussed provider/patient relationship and expectations.  Chief Complaint  Patient presents with   Establish Care   Had to change PCP because she got new insurance and old PCP was no longer in network.    DIABETES Currently taking glipizide 2.5 mg daily. Was taking Metformin 1000 mg twice daily, now taking 1000 mg in morning and 500 mg at night because it was causing hair loss.  Also taking Jardiance 25 mg every morning.   Hypoglycemic episodes:no Polydipsia/polyuria: no Visual disturbance: no Chest pain: no Paresthesias: no Glucose Monitoring: no Taking Insulin?: no Blood Pressure Monitoring: not checking Retinal Examination: Up to Date Foot Exam: Up to Date Diabetic Education: Completed Pneumovax: unknown Influenza: Up to Date Aspirin: yes Statin: yes  Has 20% rotator cuff tear in right shoulder.  Follows with Dr. Luna Glasgow - Orthopedics.  She gets injections in her shoulder occasionally, tells me she waits until the pain gets really bad first.  RHEUMATOID ARTHRITIS Has upcoming appointment with Rheumatology in November.  Old RA doctor retired.  Ran out of medication about 1 month ago - it sounds like she was taking methotrexate and folic acid. Duration: years Pain: yes Symmetric: yes Severity: ranges; currently severe because she is out of medication Quality: aching Frequency: constant, worse with movement Context:  stable Decreased function/range of motion: yes Erythema: no  Swelling: yes  Morning stiffness: yes Aggravating factors:  Alleviating factors:  Relief with NSAIDs?: no NSAIDs taken Treatments attempted:  methotrexate, folic acid in the past Involved Joints:     Hands: yes  bilateral    Wrists: no      Elbows: no     Shoulders: no     Back: no     Hips: no     Knees: no     Ankles: no     Feet: no   She reports she recently had blood work done in August with previous PCP.    No Known Allergies  Outpatient Encounter Medications as of 05/25/2021  Medication Sig   aspirin EC 81 MG tablet Take 81 mg by mouth daily.   atorvastatin (LIPITOR) 20 MG tablet Take 20 mg at bedtime by mouth.   cholecalciferol (VITAMIN D) 1000 UNITS tablet Take 1,000 Units by mouth daily.   Cyanocobalamin (B-12 PO) Take 1 tablet by mouth daily.   glipiZIDE (GLUCOTROL) 5 MG tablet Take 2.5 mg by mouth daily before breakfast.   HYDROcodone-acetaminophen (NORCO/VICODIN) 5-325 MG tablet One tablet every six hours for pain.  Limit 7 days.   JARDIANCE 25 MG TABS tablet Take 25 mg by mouth every morning.   metFORMIN (GLUCOPHAGE) 500 MG tablet Take 500-1,000 mg by mouth 2 (two) times daily with a meal. Takes 1000 mg in the morning and 500 mg in the evening   No facility-administered encounter medications on file as of 05/25/2021.    Active Ambulatory Problems    Diagnosis Date Noted   Partial nontraumatic tear of rotator cuff 10/18/2012   Rotator cuff syndrome of right shoulder 10/18/2012   Atypical squamous cell changes of undetermined significance (ASCUS) on vaginal cytology 08/23/2013   Seropositive rheumatoid arthritis ([place]) 04/14/2018   Type 2 diabetes mellitus with diabetic  retinopathy ([place]) 05/25/2021   Proliferative diabetic retinopathy of left eye with macular edema associated with type 2 diabetes mellitus ([place]) 05/27/2021   Resolved Ambulatory Problems    Diagnosis Date Noted   No Resolved Ambulatory Problems   Past Medical History:  Diagnosis Date   Diabetes mellitus without complication ([place])     Past Medical History:  Diagnosis Date   Diabetes mellitus without complication [facility])     Past Surgical History:  Procedure Laterality Date   COLONOSCOPY N/A  09/27/2012   Procedure: COLONOSCOPY;  Surgeon: Rogene Houston, MD;  Location: [facility];  Service: Endoscopy;  Laterality: N/A;  1030   Dilation and Currettage     TUBAL LIGATION      Social History   Tobacco Use   Smoking status: Former    Packs/day: 0.50    Years: 6.00    Pack years: 3.00    Types: Cigarettes   Smokeless tobacco: Never  Vaping Use   Vaping Use: Never used  Substance Use Topics   Alcohol use: No   Drug use: No    Family History  Problem Relation Age of Onset   Diabetes Mother    Heart disease Mother    Diabetes Father    COPD Sister    Hypertension Brother    Berenice Primas' disease Daughter    Colon cancer Neg Hx     Review of Systems Per HPI unless specifically indicated above     Objective:    BP 121/68 (BP Location: Left Arm, Patient Position: Sitting, Cuff Size: Normal)   Pulse 78   Temp 98.7 F (37.1 C) (Oral)   Resp 10   Ht 5\' 4"  (1.626 m)   Wt 209 lb (94.8 kg)   LMP 05/25/2016   SpO2 97%   BMI 35.87 kg/m   Wt Readings from Last 3 Encounters:  05/25/21 209 lb (94.8 kg)  03/17/21 206 lb 9.6 oz (93.7 kg)  02/03/21 208 lb (94.3 kg)    Physical Exam Vitals and nursing note reviewed.  Constitutional:      General: She is not in acute distress.    Appearance: Normal appearance. She is obese. She is not toxic-appearing.  HENT:     Head: Normocephalic and atraumatic.     Right Ear: Tympanic membrane, ear canal and external ear normal.     Left Ear: Tympanic membrane, ear canal and external ear normal.  Eyes:     General: No scleral icterus.    Extraocular Movements: Extraocular movements intact.  Cardiovascular:     Rate and Rhythm: Normal rate and regular rhythm.     Heart sounds: Normal heart sounds. No murmur heard. Pulmonary:     Effort: Pulmonary effort is normal. No respiratory distress.     Breath sounds: Normal breath sounds. No wheezing, rhonchi or rales.  Musculoskeletal:        General: Swelling present.     Right  hand: Swelling and bony tenderness present. No tenderness. Decreased strength. Normal capillary refill. Normal pulse.     Left hand: Swelling and bony tenderness present. No tenderness. Decreased strength. Normal capillary refill. Normal pulse.     Cervical back: Normal range of motion.     Right lower leg: No edema.     Left lower leg: No edema.     Comments: Non pitting edema to bilateral hands consistent with arthritis  Lymphadenopathy:     Cervical: No cervical adenopathy.  Skin:    General: Skin is warm  and dry.     Capillary Refill: Capillary refill takes less than 2 seconds.     Coloration: Skin is not jaundiced or pale.     Findings: No erythema.  Neurological:     Mental Status: She is alert and oriented to person, place, and time.     Motor: No weakness.     Gait: Gait normal.  Psychiatric:        Mood and Affect: Mood normal.        Behavior: Behavior normal.        Thought Content: Thought content normal.        Judgment: Judgment normal.       Assessment & Plan:   Problem List Items Addressed This Visit       Endocrine   Type 2 diabetes mellitus with diabetic retinopathy ([place])    Chronic.  Will request records from previous PCP to review lab work.  Specifically, looking for HgbA1c to be less than 7%, urine microalbumin, lipid, CMET.  Also will request eye exam from ophthalmologist.  On review of records, it does look like she has seen a Retina Specialist for diabetic retinopathy.  Continue collaboration.  Foot exam today is normal.  Continue glipizide 2.5 mg daily, Metformin 1000 mg in the morning and 500 mg in the afternoon for now.  Also continue atorvastatin 20 mg daily for now.  Follow up in February or pending blood work findings.      Proliferative diabetic retinopathy of left eye with macular edema associated with type 2 diabetes mellitus ([place])    Continue collaboration with ophthalmology and retina specialist .         Musculoskeletal and Integument    Seropositive rheumatoid arthritis ([place])    Patient has been out of medication for about 1 month.  She has upcoming visit with Rheumatologist net week.  Will defer management to Rheumatology.  Continue collaboration.      Rotator cuff syndrome of right shoulder    Chronic.  Continue collaboration with Orthopedics.      Other Visit Diagnoses     Encounter to establish care    -  Primary   Severe nonproliferative diabetic retinopathy of right eye with macular edema associated with type 2 diabetes mellitus ([place])   (Chronic)          Follow up plan: Return in about 6 months (around 11/23/2021) for dm F/U.

## 2021-05-27 DIAGNOSIS — E113512 Type 2 diabetes mellitus with proliferative diabetic retinopathy with macular edema, left eye: Secondary | ICD-10-CM | POA: Insufficient documentation

## 2021-05-27 NOTE — Assessment & Plan Note (Signed)
Continue collaboration with ophthalmology and retina specialist .

## 2021-05-27 NOTE — Assessment & Plan Note (Signed)
Patient has been out of medication for about 1 month.  She has upcoming visit with Rheumatologist net week.  Will defer management to Rheumatology.  Continue collaboration.

## 2021-05-27 NOTE — Assessment & Plan Note (Signed)
Chronic.  Continue collaboration with Orthopedics.

## 2021-05-27 NOTE — Assessment & Plan Note (Addendum)
Chronic.  Will request records from previous PCP to review lab work.  Specifically, looking for HgbA1c to be less than 7%, urine microalbumin, lipid, CMET.  Also will request eye exam from ophthalmologist.  On review of records, it does look like she has seen a Retina Specialist for diabetic retinopathy.  Continue collaboration.  Foot exam today is normal.  Continue glipizide 2.5 mg daily, Metformin 1000 mg in the morning and 500 mg in the afternoon for now.  Also continue atorvastatin 20 mg daily for now.  Follow up in February or pending blood work findings.

## 2021-06-02 NOTE — Progress Notes (Signed)
Office Visit Note  Patient: Brooke Sawyer             Date of Birth: [birth year]           MRN: [id number]             PCP: Valentino Nose, NP Referring: Lazaro Arms, MD Visit Date: 06/03/2021 Occupation: Textile work  Subjective:  New Patient (Initial Visit) (Bil hand pain and swelling, transfer of care due to insurance)   History of Present Illness: Brooke Sawyer is a 60 y.o. female here for seropositive, erosive RA previously managed at [facility] clinic rheumatology. She was originally diagnosed in 2019 with symptoms of bilateral finger joints pain and swelling and decreased mobility. Rheumatology workup showing positive RF, CCP, and erosive disease on xray imaging and she started treatment with methotrexate and folic acid with initial 1 week prednisone taper. She has felt overall well controlled disease on this regimen. Some ongoing swelling led to adding hydroxychloroquine to methotrexate but had a hard time getting this due to COVID related acces issues so never stayed on it. She changed insurance due to her husband retiring on medicare benefits and unable to follow up with [facility] on new insurance. She is off methotrexate treatment for several months and has significant finger joint swelling, stiffness, and pain. She gets partial relief with NSAIDs which she currently uses to treated her finger pain and swelling. She broke her 4th and 5th does from a minor trauma with losing her balance. She has never suffered a major bone fracture. She takes daily vitamin D supplementation but reports never having bone density screening for osteoporosis.  Labs reviewed 01/2019 ESR 31 CRP 15 CBC Hgb 11.0 CMP wnl TB neg  04/2018 RF 230.7 CCP >250 ANA neg  Imaging reviewed (report only) 2019 Xrays Left knee Mild degenerative joint space narrowing osteoarthritis. Right foot Mild proliferative disease first toe and or the interphalangeal joint. Left Foot Mild proliferative disease  first MTP and IP Right hand First MCP, mild subluxation, focal erosion head of the first MCP.   Left hand Interphalangeal joint osteoarthritis   Activities of Daily Living:  Patient reports morning stiffness for 1 hour.   Patient Reports nocturnal pain.  Difficulty dressing/grooming: Reports Difficulty climbing stairs: Denies Difficulty getting out of chair: Denies Difficulty using hands for taps, buttons, cutlery, and/or writing: Reports  Review of Systems  Constitutional:  Negative for fatigue.  HENT:  Negative for mouth dryness.   Eyes:  Negative for dryness.  Respiratory:  Negative for shortness of breath.   Cardiovascular:  Negative for swelling in legs/feet.  Gastrointestinal:  Negative for constipation.  Endocrine: Negative for excessive thirst.  Genitourinary:  Negative for difficulty urinating.  Musculoskeletal:  Positive for joint pain, joint pain, joint swelling and morning stiffness.  Skin:  Negative for rash.  Allergic/Immunologic: Negative for susceptible to infections.  Neurological:  Negative for numbness.  Hematological:  Negative for bruising/bleeding tendency.  Psychiatric/Behavioral:  Negative for sleep disturbance.    PMFS History:  Patient Active Problem List   Diagnosis Date Noted   High risk medication use 06/03/2021   Proliferative diabetic retinopathy of left eye with macular edema associated with type 2 diabetes mellitus ([facility]) 05/27/2021   Type 2 diabetes mellitus with diabetic retinopathy ([facility]) 05/25/2021   Seropositive rheumatoid arthritis ([facility]) 04/14/2018   Atypical squamous cell changes of undetermined significance (ASCUS) on vaginal cytology 08/23/2013   Partial nontraumatic tear of rotator cuff 10/18/2012   Rotator  cuff syndrome of right shoulder 10/18/2012    Past Medical History:  Diagnosis Date   Diabetes mellitus without complication ([facility])    Rheumatoid arthritis ([place])    Rotator cuff tear     Family History  Problem Relation Age  of Onset   Diabetes Mother    Heart disease Mother    Diabetes Father    COPD Sister    Hypertension Brother    Berenice Primas' disease Daughter    Colon cancer Neg Hx    Past Surgical History:  Procedure Laterality Date   COLONOSCOPY N/A 09/27/2012   Procedure: COLONOSCOPY;  Surgeon: Rogene Houston, MD;  Location[facility];  Service: Endoscopy;  Laterality: N/A;  1030   Dilation and Currettage     TUBAL LIGATION     Social History   Social History Narrative   Not on file   Immunization History  Administered Date(s) Administered   Influenza Inj Mdck Quad Pf 05/12/2018, 04/21/2019   Influenza,inj,Quad PF,6+ Mos 05/07/2017   Influenza-Unspecified 05/11/2021   Moderna Sars-Covid-2 Vaccination 11/08/2019, 12/04/2019     Objective: Vital Signs: BP (!) 122/57 (BP Location: Left Arm, Patient Position: Sitting, Cuff Size: Normal)   Pulse 65   Resp 15   Ht $R'5\' 4"'wQ$  (1.626 m)   Wt 213 lb (96.6 kg)   LMP 05/25/2016   BMI 36.56 kg/m    Physical Exam Constitutional:      Appearance: She is obese.  HENT:     Right Ear: External ear normal.     Mouth/Throat:     Mouth: Mucous membranes are moist.     Pharynx: Oropharynx is clear.  Eyes:     Conjunctiva/sclera: Conjunctivae normal.  Cardiovascular:     Rate and Rhythm: Normal rate and regular rhythm.  Pulmonary:     Effort: Pulmonary effort is normal.     Breath sounds: Normal breath sounds.  Musculoskeletal:     Right lower leg: No edema.     Left lower leg: No edema.  Skin:    General: Skin is warm and dry.     Findings: No rash.  Neurological:     General: No focal deficit present.     Mental Status: She is alert.     Deep Tendon Reflexes: Reflexes normal.  Psychiatric:        Mood and Affect: Mood normal.     Musculoskeletal Exam:  Shoulders full ROM no tenderness or swelling Elbows full ROM no tenderness or swelling Wrists decreased extension ROM mild swelling or hypertrophy, no tenderness, Right hand MCPs  swollen and decreased flexion ROM, tender particularly at 3rd finger and 3rd PIP Knees full ROM no tenderness or swelling Ankles full ROM no tenderness or swelling MTPs full ROM no tenderness or swelling   CDAI Exam: CDAI Score: 20  Patient Global: 40 mm; Provider Global: 60 mm Swollen: 8 ; Tender: 2  Joint Exam 06/03/2021      Right  Left  Wrist  Swollen   Swollen   MCP 2  Swollen      MCP 3  Swollen Tender     MCP 4  Swollen      MCP 5  Swollen      PIP 2  Swollen      PIP 3  Swollen Tender        Investigation: No additional findings.  Imaging: DG Chest 2 View  Result Date: 06/03/2021 CLINICAL DATA:  Pt is seropositive for RA, starting methotrexate treatment.  Baseline CXR. No symptoms. EXAM: CHEST - 2 VIEW COMPARISON:  none FINDINGS: Lungs are clear. Heart size and mediastinal contours are within normal limits. No effusion. Visualized bones unremarkable. IMPRESSION: No acute cardiopulmonary disease. Electronically Signed   By: Lucrezia Europe M.D.   On: 06/03/2021 16:42    Recent Labs: Lab Results  Component Value Date   WBC 6.3 06/03/2021   HGB 11.3 (L) 06/03/2021   PLT 233 06/03/2021   NA 142 06/03/2021   K 4.6 06/03/2021   CL 106 06/03/2021   CO2 26 06/03/2021   GLUCOSE 152 (H) 06/03/2021   BUN 10 06/03/2021   CREATININE 0.72 06/03/2021   BILITOT 0.3 06/03/2021   AST 13 06/03/2021   ALT 15 06/03/2021   PROT 7.1 06/03/2021   CALCIUM 9.0 06/03/2021   GFRAA  05/15/2008    >60        The eGFR has been calculated using the MDRD equation. This calculation has not been validated in all clinical    Speciality Comments: No specialty comments available.  Procedures:  No procedures performed Allergies: Patient has no known allergies.   Assessment / Plan:     Visit Diagnoses: Seropositive rheumatoid arthritis ([place]) - Plan: Sedimentation rate, DG Chest 2 View, methotrexate (RHEUMATREX) 2.5 MG tablet, folic acid (FOLVITE) 1 MG tablet, methylPREDNISolone (MEDROL  DOSEPAK) 4 MG TBPK tablet  Currently very active disease due to interruption in treatment needing to transfer care. She has type 2 diabetes with some complications so will try initial treatment just 1 week taper pack of solumedrol for current symptoms. Restarting methotrexate 20 mg PO weekly and folic acid 1 mg daily. Checking ESR today for disease activity monitoring and see how well this correlates with the overt joint inflammation. She previously had good control on methotrexate but may take some time to regain efficacy.  High risk medication use - Plan: Hepatitis B core antibody, IgM, Hepatitis B surface antigen, Hepatitis C antibody, CBC with Differential/Platelet, COMPLETE METABOLIC PANEL WITH GFR, DG Chest 2 View  Restarting methotrexate we will need to monitor for medication toxicity checking baseline chest xray, hepatitis serology, and baseline CBC and CMP today.  Orders: Orders Placed This Encounter  Procedures   DG Chest 2 View   Hepatitis B core antibody, IgM   Hepatitis B surface antigen   Hepatitis C antibody   CBC with Differential/Platelet   COMPLETE METABOLIC PANEL WITH GFR   Sedimentation rate    Meds ordered this encounter  Medications   methotrexate (RHEUMATREX) 2.5 MG tablet    Sig: Take 8 tablets (20 mg total) by mouth once a week. Caution:Chemotherapy. Protect from light.    Dispense:  40 tablet    Refill:  0   folic acid (FOLVITE) 1 MG tablet    Sig: Take 1 tablet (1 mg total) by mouth daily.    Dispense:  90 tablet    Refill:  0   methylPREDNISolone (MEDROL DOSEPAK) 4 MG TBPK tablet    Sig: Take 6 tablets (24 mg total) by mouth daily for 1 day, THEN 5 tablets (20 mg total) daily for 1 day, THEN 4 tablets (16 mg total) daily for 1 day, THEN 3 tablets (12 mg total) daily for 1 day, THEN 2 tablets (8 mg total) daily for 1 day, THEN 1 tablet (4 mg total) daily for 1 day.    Dispense:  21 tablet    Refill:  0   ]  Follow-Up Instructions: No follow-ups on  file.  Collier Salina, MD  Note - This record has been created using [employer].  Chart creation errors have been sought, but may not always  have been located. Such creation errors do not reflect on  the standard of medical care.

## 2021-06-03 ENCOUNTER — Encounter: Payer: Self-pay | Admitting: Internal Medicine

## 2021-06-03 ENCOUNTER — Ambulatory Visit
Admission: RE | Admit: 2021-06-03 | Discharge: 2021-06-03 | Disposition: A | Discharge status: Home / Self Care | Payer: 59 | Source: Ambulatory Visit | Attending: Internal Medicine | Admitting: Internal Medicine

## 2021-06-03 ENCOUNTER — Ambulatory Visit: Payer: 59 | Admitting: Internal Medicine

## 2021-06-03 ENCOUNTER — Other Ambulatory Visit: Payer: Self-pay

## 2021-06-03 VITALS — BP 122/57 | HR 65 | Resp 15 | Ht 64.0 in | Wt 213.0 lb

## 2021-06-03 DIAGNOSIS — Z79899 Other long term (current) drug therapy: Secondary | ICD-10-CM

## 2021-06-03 DIAGNOSIS — M059 Rheumatoid arthritis with rheumatoid factor, unspecified: Secondary | ICD-10-CM

## 2021-06-03 MED ORDER — FOLIC ACID 1 MG PO TABS: 1.0000 mg | ORAL_TABLET | Freq: Every day | ORAL | 0 refills | Status: DC

## 2021-06-03 MED ORDER — METHOTREXATE 2.5 MG PO TABS: 20.0000 mg | ORAL_TABLET | ORAL | 0 refills | Status: DC

## 2021-06-03 MED ORDER — METHYLPREDNISOLONE 4 MG PO TBPK: ORAL_TABLET | ORAL | 0 refills | Status: AC

## 2021-06-03 NOTE — Patient Instructions (Signed)
Hydroxychloroquine Tablets What is this medication? HYDROXYCHLOROQUINE (hye drox ee KLOR oh kwin) treats autoimmune conditions, such as rheumatoid arthritis and lupus. It works by slowing down an overactive immune system. It may also be used to prevent and treat malaria. It works by killing the parasite that causes malaria. It belongs to a group of medications called DMARDs. This medicine may be used for other purposes; ask your health care provider or pharmacist if you have questions. COMMON BRAND NAME(S): Plaquenil, Quineprox What should I tell my care team before I take this medication? They need to know if you have any of these conditions: Diabetes Eye disease, vision problems G6PD deficiency Heart disease History of irregular heartbeat If you often drink alcohol Kidney disease Liver disease Porphyria Psoriasis An unusual or allergic reaction to chloroquine, hydroxychloroquine, other medications, foods, dyes, or preservatives Pregnant or trying to get pregnant Breast-feeding How should I use this medication? Take this medication by mouth with a glass of water. Take it as directed on the prescription label. Do not cut, crush or chew this medication. Swallow the tablets whole. Take it with food. Do not take it more than directed. Take all of this medication unless your care team tells you to stop it early. Keep taking it even if you think you are better. Take products with antacids in them at a different time of day than this medication. Take this medication 4 hours before or 4 hours after antacids. Talk to your care team if you have questions. Talk to your care team about the use of this medication in children. While this medication may be prescribed for selected conditions, precautions do apply. Overdosage: If you think you have taken too much of this medicine contact a poison control center or emergency room at once. NOTE: This medicine is only for you. Do not share this medicine with  others. What if I miss a dose? If you miss a dose, take it as soon as you can. If it is almost time for your next dose, take only that dose. Do not take double or extra doses. What may interact with this medication? Do not take this medication with any of the following: Cisapride Dronedarone Pimozide Thioridazine This medication may also interact with the following: Ampicillin Antacids Cimetidine Cyclosporine Digoxin Kaolin Medications for diabetes, like insulin, glipizide, glyburide Medications for seizures like carbamazepine, phenobarbital, phenytoin Mefloquine Methotrexate Other medications that prolong the QT interval (cause an abnormal heart rhythm) Praziquantel This list may not describe all possible interactions. Give your health care provider a list of all the medicines, herbs, non-prescription drugs, or dietary supplements you use. Also tell them if you smoke, drink alcohol, or use illegal drugs. Some items may interact with your medicine. What should I watch for while using this medication? Visit your care team for regular checks on your progress. Tell your care team if your symptoms do not start to get better or if they get worse. You may need blood work done while you are taking this medication. If you take other medications that can affect heart rhythm, you may need more testing. Talk to your care team if you have questions. Your vision may be tested before and during use of this medication. Tell your care team right away if you have any change in your eyesight. This medication may cause serious skin reactions. They can happen weeks to months after starting the medication. Contact your care team right away if you notice fevers or flu-like symptoms with a rash. The   rash may be red or purple and then turn into blisters or peeling of the skin. Or, you might notice a red rash with swelling of the face, lips or lymph nodes in your neck or under your arms. If you or your family  notice any changes in your behavior, such as new or worsening depression, thoughts of harming yourself, anxiety, or other unusual or disturbing thoughts, or memory loss, call your care team right away. What side effects may I notice from receiving this medication? Side effects that you should report to your care team as soon as possible: Allergic reactions-skin rash, itching, hives, swelling of the face, lips, tongue, or throat Aplastic anemia-unusual weakness or fatigue, dizziness, headache, trouble breathing, increased bleeding or bruising Change in vision Heart rhythm changes-fast or irregular heartbeat, dizziness, feeling faint or lightheaded, chest pain, trouble breathing Infection-fever, chills, cough, or sore throat Low blood sugar (hypoglycemia)-tremors or shaking, anxiety, sweating, cold or clammy skin, confusion, dizziness, rapid heartbeat Muscle injury-unusual weakness or fatigue, muscle pain, dark yellow or brown urine, decrease in amount of urine Pain, tingling, or numbness in the hands or feet Rash, fever, and swollen lymph nodes Redness, blistering, peeling, or loosening of the skin, including inside the mouth Thoughts of suicide or self-harm, worsening mood, or feelings of depression Unusual bruising or bleeding Side effects that usually do not require medical attention (report to your care team if they continue or are bothersome): Diarrhea Headache Nausea Stomach pain Vomiting This list may not describe all possible side effects. Call your doctor for medical advice about side effects. You may report side effects to FDA at 1-800-FDA-1088. Where should I keep my medication? Keep out of the reach of children and pets. Store at room temperature up to 30 degrees C (86 degrees F). Protect from light. Get rid of any unused medication after the expiration date. To get rid of medications that are no longer needed or have expired: Take the medication to a medication take-back  program. Check with your pharmacy or law enforcement to find a location. If you cannot return the medication, check the label or package insert to see if the medication should be thrown out in the garbage or flushed down the toilet. If you are not sure, ask your care team. If it is safe to put it in the trash, empty the medication out of the container. Mix the medication with cat litter, dirt, coffee grounds, or other unwanted substance. Seal the mixture in a bag or container. Put it in the trash. NOTE: This sheet is a summary. It may not cover all possible information. If you have questions about this medicine, talk to your doctor, pharmacist, or health care provider.  2022 Elsevier/Gold Standard (2020-10-28 10:19:21)  

## 2021-06-04 LAB — HEPATITIS C ANTIBODY
Hepatitis C Ab: NONREACTIVE
SIGNAL TO CUT-OFF: 0.11 (ref <1.00)

## 2021-06-04 LAB — COMPLETE METABOLIC PANEL WITH GFR
AG Ratio: 1.2 (calc) (ref 1.0–2.5)
ALT: 15 U/L (ref 6–29)
AST: 13 U/L (ref 10–35)
Albumin: 3.9 g/dL (ref 3.6–5.1)
Alkaline phosphatase (APISO): 101 U/L (ref 37–153)
BUN: 10 mg/dL (ref 7–25)
CO2: 26 mmol/L (ref 20–32)
Calcium: 9 mg/dL (ref 8.6–10.4)
Chloride: 106 mmol/L (ref 98–110)
Creat: 0.72 mg/dL (ref 0.50–1.05)
Globulin: 3.2 g/dL (calc) (ref 1.9–3.7)
Glucose, Bld: 152 mg/dL — ABNORMAL HIGH (ref 65–99)
Potassium: 4.6 mmol/L (ref 3.5–5.3)
Sodium: 142 mmol/L (ref 135–146)
Total Bilirubin: 0.3 mg/dL (ref 0.2–1.2)
Total Protein: 7.1 g/dL (ref 6.1–8.1)
eGFR: 95 mL/min/{1.73_m2} (ref >=60)

## 2021-06-04 LAB — HEPATITIS B CORE ANTIBODY, IGM: Hep B C IgM: NONREACTIVE

## 2021-06-04 LAB — CBC WITH DIFFERENTIAL/PLATELET
Absolute Monocytes: 561 cells/uL (ref 200–950)
Basophils Absolute: 32 cells/uL (ref 0–200)
Basophils Relative: 0.5 %
Eosinophils Absolute: 372 cells/uL (ref 15–500)
Eosinophils Relative: 5.9 %
HCT: 34.7 % — ABNORMAL LOW (ref 35.0–45.0)
Hemoglobin: 11.3 g/dL — ABNORMAL LOW (ref 11.7–15.5)
Lymphs Abs: 1770 cells/uL (ref 850–3900)
MCH: 26.5 pg — ABNORMAL LOW (ref 27.0–33.0)
MCHC: 32.6 g/dL (ref 32.0–36.0)
MCV: 81.3 fL (ref 80.0–100.0)
MPV: 11.8 fL (ref 7.5–12.5)
Monocytes Relative: 8.9 %
Neutro Abs: 3566 cells/uL (ref 1500–7800)
Neutrophils Relative %: 56.6 %
Platelets: 233 10*3/uL (ref 140–400)
RBC: 4.27 10*6/uL (ref 3.80–5.10)
RDW: 13.6 % (ref 11.0–15.0)
Total Lymphocyte: 28.1 %
WBC: 6.3 10*3/uL (ref 3.8–10.8)

## 2021-06-04 LAB — SEDIMENTATION RATE: Sed Rate: 34 mm/h — ABNORMAL HIGH (ref 0–30)

## 2021-06-04 LAB — HEPATITIS B SURFACE ANTIGEN: Hepatitis B Surface Ag: NONREACTIVE

## 2021-06-09 ENCOUNTER — Other Ambulatory Visit (HOSPITAL_COMMUNITY): Payer: Self-pay | Admitting: Obstetrics & Gynecology

## 2021-06-09 DIAGNOSIS — Z1231 Encounter for screening mammogram for malignant neoplasm of breast: Secondary | ICD-10-CM

## 2021-06-16 NOTE — Progress Notes (Signed)
Office Visit Note  Patient: Brooke Sawyer             Date of Birth: 1961-06-25           MRN: [id number]             PCP: Valentino Nose, NP Referring: Mack Hook, FNP Visit Date: 06/17/2021   Subjective:  Follow-up (Doing good)   History of Present Illness: Brooke Sawyer is a 60 y.o. female here for follow up after initial visit for transfer of care for rheumatoid arthritis initially to continue treatment with methotrexate 20 mg PO weekly. Lab tests at initial visit showing ESR of 34 minimal elevation baseline CBC and CMP unremarkable. Since our last visit she feels symptoms are doing pretty well she finished taking the steroids uneventfully. She saw optometrist told her she needs to see the ophthalmologist, says the last visit in 2020 workup was extensive and had a patient cost of $500 so has not been regularly following up with Dr. Vanessa Barbara recently.  Previous HPI 06/03/21 Brooke Sawyer is a 60 y.o. female here for seropositive, erosive RA previously managed at [facility] rheumatology. She was originally diagnosed in 2019 with symptoms of bilateral finger joints pain and swelling and decreased mobility. Rheumatology workup showing positive RF, CCP, and erosive disease on xray imaging and she started treatment with methotrexate and folic acid with initial 1 week prednisone taper. She has felt overall well controlled disease on this regimen. Some ongoing swelling led to adding hydroxychloroquine to methotrexate but had a hard time getting this due to COVID related acces issues so never stayed on it. She changed insurance due to her husband retiring on medicare benefits and unable to follow up with [place] [facility] on new insurance. She is off methotrexate treatment for several months and has significant finger joint swelling, stiffness, and pain. She gets partial relief with NSAIDs which she currently uses to treated her finger pain and swelling. She broke her 4th and 5th does from  a minor trauma with losing her balance. She has never suffered a major bone fracture. She takes daily vitamin D supplementation but reports never having bone density screening for osteoporosis.   Labs reviewed 01/2019 ESR 31 CRP 15 CBC Hgb 11.0 CMP wnl TB neg   04/2018 RF 230.7 CCP >250 ANA neg   Imaging reviewed (report only) 2019 Xrays Left knee Mild degenerative joint space narrowing osteoarthritis. Right foot Mild proliferative disease first toe and or the interphalangeal joint. Left Foot Mild proliferative disease first MTP and IP Right hand First MCP, mild subluxation, focal erosion head of the first MCP.   Left hand Interphalangeal joint osteoarthritis   Review of Systems  Constitutional:  Negative for fatigue.  HENT:  Negative for mouth dryness.   Eyes:  Negative for dryness.  Respiratory:  Negative for shortness of breath.   Cardiovascular:  Negative for swelling in legs/feet.  Gastrointestinal:  Negative for constipation.  Endocrine: Negative for excessive thirst.  Genitourinary:  Negative for difficulty urinating.  Musculoskeletal:  Negative for joint pain and joint pain.  Skin:  Negative for rash.  Allergic/Immunologic: Negative for susceptible to infections.  Neurological:  Negative for weakness.  Hematological:  Negative for bruising/bleeding tendency.  Psychiatric/Behavioral:  Negative for sleep disturbance.    PMFS History:  Patient Active Problem List   Diagnosis Date Noted   High risk medication use 06/03/2021   Proliferative diabetic retinopathy of left eye with macular edema associated with type  2 diabetes mellitus ([place]) 05/27/2021   Type 2 diabetes mellitus with diabetic retinopathy ([place]) 05/25/2021   Seropositive rheumatoid arthritis ([employer]) 04/14/2018   Atypical squamous cell changes of undetermined significance (ASCUS) on vaginal cytology 08/23/2013   Partial nontraumatic tear of rotator cuff 10/18/2012   Rotator cuff syndrome of right  shoulder 10/18/2012    Past Medical History:  Diagnosis Date   Diabetes mellitus without complication (HCC)    Rheumatoid arthritis (HCC)    Rotator cuff tear     Family History  Problem Relation Age of Onset   Diabetes Mother    Heart disease Mother    Diabetes Father    COPD Sister    Hypertension Brother    Berenice Primas' disease Daughter    Colon cancer Neg Hx    Past Surgical History:  Procedure Laterality Date   COLONOSCOPY N/A 09/27/2012   Procedure: COLONOSCOPY;  Surgeon: Rogene Houston, MD;  Location: [facility];  Service: Endoscopy;  Laterality: N/A;  1030   Dilation and Currettage     TUBAL LIGATION     Social History   Social History Narrative   Not on file   Immunization History  Administered Date(s) Administered   Influenza Inj Mdck Quad Pf 05/12/2018, 04/21/2019   Influenza,inj,Quad PF,6+ Mos 05/07/2017   Influenza-Unspecified 05/11/2021   Moderna Sars-Covid-2 Vaccination 11/08/2019, 12/04/2019     Objective: Vital Signs: BP (!) 109/57 (BP Location: Left Arm, Patient Position: Sitting, Cuff Size: Normal)   Pulse 86   Resp 16   Ht $R'5\' 4"'OX$  (1.626 m)   Wt 211 lb (95.7 kg)   LMP 05/25/2016   BMI 36.22 kg/m    Physical Exam Constitutional:      Appearance: She is obese.  Eyes:     Conjunctiva/sclera: Conjunctivae normal.  Musculoskeletal:     Right lower leg: No edema.     Left lower leg: No edema.  Skin:    General: Skin is warm and dry.     Findings: No rash.  Neurological:     Mental Status: She is alert.  Psychiatric:        Mood and Affect: Mood normal.     Musculoskeletal Exam:  Shoulders full ROM no tenderness or swelling Elbows full ROM no tenderness or swelling Wrists slightly restricted ROM, mild swelling without tenderness Right hand 2nd-3rd MCPs swollen and decreased flexion ROM, 3rd finger PIP tenderness and swelling present Knees full ROM no tenderness or swelling  CDAI Exam: CDAI Score: 13  Patient Global: 20 mm; Provider  Global: 40 mm Swollen: 6 ; Tender: 1  Joint Exam 06/17/2021      Right  Left  Wrist  Swollen   Swollen   MCP 2  Swollen      MCP 3  Swollen   Swollen   PIP 3  Swollen Tender        Investigation: No additional findings.  Imaging: DG Chest 2 View  Result Date: 06/03/2021 CLINICAL DATA:  Pt is seropositive for RA, starting methotrexate treatment. Baseline CXR. No symptoms. EXAM: CHEST - 2 VIEW COMPARISON:  none FINDINGS: Lungs are clear. Heart size and mediastinal contours are within normal limits. No effusion. Visualized bones unremarkable. IMPRESSION: No acute cardiopulmonary disease. Electronically Signed   By: Lucrezia Europe M.D.   On: 06/03/2021 16:42    Recent Labs: Lab Results  Component Value Date   WBC 7.1 06/17/2021   HGB 11.7 06/17/2021   PLT 248 06/17/2021   NA 141 06/17/2021  K 4.1 06/17/2021   CL 104 06/17/2021   CO2 26 06/17/2021   GLUCOSE 167 (H) 06/17/2021   BUN 13 06/17/2021   CREATININE 0.64 06/17/2021   BILITOT 0.3 06/17/2021   AST 14 06/17/2021   ALT 16 06/17/2021   PROT 6.8 06/17/2021   CALCIUM 8.9 06/17/2021   GFRAA  05/15/2008    >60        The eGFR has been calculated using the MDRD equation. This calculation has not been validated in all clinical    Speciality Comments: No specialty comments available.  Procedures:  No procedures performed Allergies: Patient has no known allergies.   Assessment / Plan:     Visit Diagnoses: Seropositive rheumatoid arthritis ([place]) - Plan: Sedimentation rate, hydroxychloroquine (PLAQUENIL) 200 MG tablet, methotrexate (RHEUMATREX) 2.5 MG tablet  Reported symptoms as pretty well controlled but there is still a fair amount of objective synovitis still present on exam today.  We will recheck ESR.  Plan to continue methotrexate 20 mg p.o. weekly with folic acid 1 mg daily we will also start hydroxychloroquine 200 mg p.o. daily.  High risk medication use - Plan: CBC with Differential/Platelet, COMPLETE METABOLIC  PANEL WITH GFR  Now with methotrexate restarted since 2 weeks ago we will repeat the CBC and CMP.  Orders: Orders Placed This Encounter  Procedures   Sedimentation rate   CBC with Differential/Platelet   COMPLETE METABOLIC PANEL WITH GFR    Meds ordered this encounter  Medications   hydroxychloroquine (PLAQUENIL) 200 MG tablet    Sig: Take 1 tablet (200 mg total) by mouth daily.    Dispense:  30 tablet    Refill:  2   methotrexate (RHEUMATREX) 2.5 MG tablet    Sig: Take 8 tablets (20 mg total) by mouth once a week. Caution:Chemotherapy. Protect from light.    Dispense:  40 tablet    Refill:  2      Follow-Up Instructions: Return in about 3 months (around 09/17/2021) for RA on MTX new HCQ f/u 75mos.   Collier Salina, MD  Note - This record has been created using [employer].  Chart creation errors have been sought, but may not always  have been located. Such creation errors do not reflect on  the standard of medical care.

## 2021-06-17 ENCOUNTER — Ambulatory Visit: Payer: 59 | Admitting: Internal Medicine

## 2021-06-17 ENCOUNTER — Encounter: Payer: Self-pay | Admitting: Internal Medicine

## 2021-06-17 ENCOUNTER — Other Ambulatory Visit: Payer: Self-pay

## 2021-06-17 VITALS — BP 109/57 | HR 86 | Resp 16 | Ht 64.0 in | Wt 211.0 lb

## 2021-06-17 DIAGNOSIS — M059 Rheumatoid arthritis with rheumatoid factor, unspecified: Secondary | ICD-10-CM | POA: Diagnosis not present

## 2021-06-17 DIAGNOSIS — Z79899 Other long term (current) drug therapy: Secondary | ICD-10-CM | POA: Diagnosis not present

## 2021-06-17 MED ORDER — METHOTREXATE 2.5 MG PO TABS: 20.0000 mg | ORAL_TABLET | ORAL | 2 refills | Status: DC

## 2021-06-17 MED ORDER — HYDROXYCHLOROQUINE SULFATE 200 MG PO TABS: 200.0000 mg | ORAL_TABLET | Freq: Every day | ORAL | 2 refills | Status: DC

## 2021-06-18 LAB — COMPLETE METABOLIC PANEL WITH GFR
AG Ratio: 1.4 (calc) (ref 1.0–2.5)
ALT: 16 U/L (ref 6–29)
AST: 14 U/L (ref 10–35)
Albumin: 4 g/dL (ref 3.6–5.1)
Alkaline phosphatase (APISO): 98 U/L (ref 37–153)
BUN: 13 mg/dL (ref 7–25)
CO2: 26 mmol/L (ref 20–32)
Calcium: 8.9 mg/dL (ref 8.6–10.4)
Chloride: 104 mmol/L (ref 98–110)
Creat: 0.64 mg/dL (ref 0.50–1.05)
Globulin: 2.8 g/dL (calc) (ref 1.9–3.7)
Glucose, Bld: 167 mg/dL — ABNORMAL HIGH (ref 65–99)
Potassium: 4.1 mmol/L (ref 3.5–5.3)
Sodium: 141 mmol/L (ref 135–146)
Total Bilirubin: 0.3 mg/dL (ref 0.2–1.2)
Total Protein: 6.8 g/dL (ref 6.1–8.1)
eGFR: 100 mL/min/{1.73_m2} (ref >=60)

## 2021-06-18 LAB — CBC WITH DIFFERENTIAL/PLATELET
Absolute Monocytes: 469 cells/uL (ref 200–950)
Basophils Absolute: 57 cells/uL (ref 0–200)
Basophils Relative: 0.8 %
Eosinophils Absolute: 376 cells/uL (ref 15–500)
Eosinophils Relative: 5.3 %
HCT: 36.6 % (ref 35.0–45.0)
Hemoglobin: 11.7 g/dL (ref 11.7–15.5)
Lymphs Abs: 1846 cells/uL (ref 850–3900)
MCH: 26.2 pg — ABNORMAL LOW (ref 27.0–33.0)
MCHC: 32 g/dL (ref 32.0–36.0)
MCV: 82.1 fL (ref 80.0–100.0)
MPV: 11.2 fL (ref 7.5–12.5)
Monocytes Relative: 6.6 %
Neutro Abs: 4352 cells/uL (ref 1500–7800)
Neutrophils Relative %: 61.3 %
Platelets: 248 10*3/uL (ref 140–400)
RBC: 4.46 10*6/uL (ref 3.80–5.10)
RDW: 14.5 % (ref 11.0–15.0)
Total Lymphocyte: 26 %
WBC: 7.1 10*3/uL (ref 3.8–10.8)

## 2021-06-18 LAB — SEDIMENTATION RATE: Sed Rate: 34 mm/h — ABNORMAL HIGH (ref 0–30)

## 2021-06-18 NOTE — Progress Notes (Signed)
Blood count and liver function tests look normal with the methotrexate can continue as planned. Her sedimentation rate is 34 which remains just slightly above normal.

## 2021-07-16 ENCOUNTER — Other Ambulatory Visit: Payer: Self-pay

## 2021-07-16 ENCOUNTER — Ambulatory Visit (HOSPITAL_COMMUNITY): Payer: 59

## 2021-07-16 ENCOUNTER — Ambulatory Visit (HOSPITAL_COMMUNITY)
Admission: RE | Admit: 2021-07-16 | Discharge: 2021-07-16 | Disposition: A | Discharge status: Home / Self Care | Payer: 59 | Source: Ambulatory Visit | Attending: Obstetrics & Gynecology | Admitting: Obstetrics & Gynecology

## 2021-07-16 ENCOUNTER — Telehealth: Payer: Self-pay

## 2021-07-16 DIAGNOSIS — Z1231 Encounter for screening mammogram for malignant neoplasm of breast: Secondary | ICD-10-CM | POA: Diagnosis present

## 2021-07-16 NOTE — Telephone Encounter (Signed)
Pt came in stating that she received a bill for this office. Pt would like for claim to be resubmitted as the insurance she has made an error in her birth date. Please advise.  Cb#: [phone]

## 2021-07-18 ENCOUNTER — Other Ambulatory Visit: Payer: Self-pay | Admitting: Internal Medicine

## 2021-07-18 DIAGNOSIS — M059 Rheumatoid arthritis with rheumatoid factor, unspecified: Secondary | ICD-10-CM

## 2021-07-20 NOTE — Telephone Encounter (Signed)
Spoke with pt about disregarding bill, pt understood.

## 2021-07-20 NOTE — Telephone Encounter (Signed)
I have added Friday's insurance and I have resubmitted to the insurance. Patient should disregard bill at this time and allow 30-45 days for them to pay.

## 2021-07-31 ENCOUNTER — Other Ambulatory Visit: Payer: Self-pay | Admitting: Internal Medicine

## 2021-07-31 DIAGNOSIS — M059 Rheumatoid arthritis with rheumatoid factor, unspecified: Secondary | ICD-10-CM

## 2021-08-04 ENCOUNTER — Telehealth: Payer: Self-pay | Admitting: Nurse Practitioner

## 2021-08-04 NOTE — Telephone Encounter (Signed)
Received notice in the mail from [employer] regarding bill received for DOS 05/25/21. [employer] was unable to locate member with information submitted on claim; requesting correct insurance info from patient. Copy of letter uploaded in documents labeled as "Letter from [employer] ins for DOS 05/25/21".   [place] on patient's voicemail.

## 2021-08-05 NOTE — Telephone Encounter (Signed)
Patient returned call. Friday insurance info on card as follows:   ID #: [id number] GRP #: Individual Own EX [facility] Bin #: [id number] RX Group #: [id number]  Number on card for providers to call is [phone].  Patient requesting bill be resubmitted for DOS 05/25/21 with the information provided above.  Please advise patient at [phone].

## 2021-08-07 ENCOUNTER — Telehealth: Payer: Self-pay

## 2021-08-07 ENCOUNTER — Other Ambulatory Visit: Payer: Self-pay | Admitting: Nurse Practitioner

## 2021-08-07 MED ORDER — ATORVASTATIN CALCIUM 20 MG PO TABS: 20.0000 mg | ORAL_TABLET | Freq: Every day | ORAL | 0 refills | Status: DC

## 2021-08-07 MED ORDER — GLIPIZIDE 5 MG PO TABS: 2.5000 mg | ORAL_TABLET | Freq: Every day | ORAL | 0 refills | Status: DC

## 2021-08-07 NOTE — Telephone Encounter (Signed)
Pt called in requesting a refill of atorvastatin (LIPITOR) 20 MG tablet and glipiZIDE (GLUCOTROL) 5 MG tablet sent to pharmacy.   Cb#: [phone]

## 2021-08-07 NOTE — Telephone Encounter (Signed)
Prescriptions sent in

## 2021-08-09 ENCOUNTER — Other Ambulatory Visit: Payer: Self-pay | Admitting: Nurse Practitioner

## 2021-08-10 ENCOUNTER — Other Ambulatory Visit: Payer: Self-pay

## 2021-08-10 MED ORDER — GLIPIZIDE ER 2.5 MG PO TB24: ORAL_TABLET | ORAL | 0 refills | Status: DC

## 2021-08-12 ENCOUNTER — Ambulatory Visit: Payer: Self-pay | Admitting: Nurse Practitioner

## 2021-08-26 ENCOUNTER — Other Ambulatory Visit: Payer: Self-pay

## 2021-08-26 ENCOUNTER — Ambulatory Visit: Payer: 59 | Admitting: Nurse Practitioner

## 2021-08-26 VITALS — BP 110/70 | HR 67 | Ht 64.0 in | Wt 207.0 lb

## 2021-08-26 DIAGNOSIS — M059 Rheumatoid arthritis with rheumatoid factor, unspecified: Secondary | ICD-10-CM

## 2021-08-26 DIAGNOSIS — E113393 Type 2 diabetes mellitus with moderate nonproliferative diabetic retinopathy without macular edema, bilateral: Secondary | ICD-10-CM | POA: Diagnosis not present

## 2021-08-26 DIAGNOSIS — E113512 Type 2 diabetes mellitus with proliferative diabetic retinopathy with macular edema, left eye: Secondary | ICD-10-CM | POA: Diagnosis not present

## 2021-08-26 NOTE — Progress Notes (Signed)
Subjective:    Patient ID: Brooke Sawyer, female    DOB: [birth year], 61 y.o.   MRN: [id number]  HPI: Brooke Sawyer is a 61 y.o. female presenting for follow up.  Chief Complaint  Patient presents with   Follow-up   She is fasting right now for about 1 month for religious purposes.  She has been drinking a lot of water and eating a lot of fruit.   DIABETES Currently taking glipizide 2.5 mg XL, metformin 500 mg twice daily with meals, and Jardiance 25 mg daily.  Hypoglycemic episodes:no Polydipsia/polyuria: no Visual disturbance: no Chest pain: no Paresthesias: no Glucose Monitoring: no Taking Insulin?: no Blood Pressure Monitoring: not checking Retinal Examination: up to date Foot Exam: Up to Date Diabetic Education: Completed Pneumovax: unknown Influenza: Up to Date Aspirin: yes Statin: yes; atorvastatin 20 mg daily Physical activity: walks or uses stationery bike every other day A1c goal: less than 7%  Rheumatoid Arthritis - follows with Dr. Benjamine Mola.  Taking folic acid, methotrexate 20 mg weekly, and hydroxychloroquine 200 mg daily.  Reports her joint pain has improved significantly since I last saw her.  She reports a knuckle on the 3rd digit of her right hand is still swollen and she does not know why.    No Known Allergies  Outpatient Encounter Medications as of 08/26/2021  Medication Sig   aspirin EC 81 MG tablet Take 81 mg by mouth daily.   atorvastatin (LIPITOR) 20 MG tablet Take 1 tablet (20 mg total) by mouth at bedtime.   cholecalciferol (VITAMIN D) 1000 UNITS tablet Take 1,000 Units by mouth daily.   Cyanocobalamin (B-12 PO) Take 1 tablet by mouth daily.   folic acid (FOLVITE) 1 MG tablet TAKE 1 TABLET BY MOUTH EVERY DAY   glipiZIDE (GLIPIZIDE XL) 2.5 MG 24 hr tablet Take 3 tablets (7.5mg )daily   hydroxychloroquine (PLAQUENIL) 200 MG tablet Take 1 tablet (200 mg total) by mouth daily.   IBUPROFEN PO Take by mouth as needed.   JARDIANCE 25 MG TABS tablet Take 25  mg by mouth every morning.   metFORMIN (GLUCOPHAGE) 500 MG tablet Take 500-1,000 mg by mouth 2 (two) times daily with a meal. Takes 1000 mg in the morning and 500 mg in the evening   methotrexate (RHEUMATREX) 2.5 MG tablet TAKE 8 TABLETS (20 MG TOTAL) BY MOUTH ONCE A WEEK. CAUTION:CHEMOTHERAPY. PROTECT FROM LIGHT.   [DISCONTINUED] HYDROcodone-acetaminophen (NORCO/VICODIN) 5-325 MG tablet One tablet every six hours for pain.  Limit 7 days. (Patient not taking: No sig reported)   No facility-administered encounter medications on file as of 08/26/2021.    Patient Active Problem List   Diagnosis Date Noted   High risk medication use 06/03/2021   Proliferative diabetic retinopathy of left eye with macular edema associated with type 2 diabetes mellitus ([place]) 05/27/2021   Type 2 diabetes mellitus with diabetic retinopathy ([place]) 05/25/2021   Seropositive rheumatoid arthritis ([place]) 04/14/2018   Atypical squamous cell changes of undetermined significance (ASCUS) on vaginal cytology 08/23/2013   Partial nontraumatic tear of rotator cuff 10/18/2012   Rotator cuff syndrome of right shoulder 10/18/2012    Past Medical History:  Diagnosis Date   Diabetes mellitus without complication (HCC)    Rheumatoid arthritis (HCC)    Rotator cuff tear     Relevant past medical, surgical, family and social history reviewed and updated as indicated. Interim medical history since our last visit reviewed.  Review of Systems Per HPI unless specifically indicated above  Objective:    BP 110/70    Pulse 67    Ht 5\' 4"  (1.626 m)    Wt 207 lb (93.9 kg)    LMP 05/25/2016    SpO2 98%    BMI 35.53 kg/m   Wt Readings from Last 3 Encounters:  08/26/21 207 lb (93.9 kg)  06/17/21 211 lb (95.7 kg)  06/03/21 213 lb (96.6 kg)    Physical Exam Vitals and nursing note reviewed.  Constitutional:      General: She is not in acute distress.    Appearance: Normal appearance. She is obese. She is not toxic-appearing.   HENT:     Head: Normocephalic and atraumatic.     Left Ear: Tympanic membrane normal.  Cardiovascular:     Rate and Rhythm: Normal rate and regular rhythm.     Heart sounds: Normal heart sounds. No murmur heard. Pulmonary:     Effort: Pulmonary effort is normal. No respiratory distress.     Breath sounds: Normal breath sounds. No wheezing, rhonchi or rales.  Musculoskeletal:     Cervical back: Normal range of motion.     Right lower leg: No edema.     Left lower leg: No edema.  Lymphadenopathy:     Cervical: No cervical adenopathy.  Skin:    General: Skin is warm and dry.     Capillary Refill: Capillary refill takes less than 2 seconds.     Coloration: Skin is not jaundiced or pale.     Findings: No erythema.  Neurological:     Mental Status: She is alert and oriented to person, place, and time.     Motor: No weakness.     Gait: Gait normal.  Psychiatric:        Mood and Affect: Mood normal.        Behavior: Behavior normal.        Thought Content: Thought content normal.        Judgment: Judgment normal.       Assessment & Plan:   Problem List Items Addressed This Visit       Endocrine   Type 2 diabetes mellitus with diabetic retinopathy ([place]) - Primary    Chronic.  A1c goal less than 6.5%.  If A1c less than goal, can decrease on medication.  Also check urine microalbumin and lipid panel, kidney function with electrolytes today.  Eye exam is up to date.  Foot exam today is normal.  Continue collaboration with Retina specialist.  Continue glipizide XL 7.5 mg daily, Metformin 1000 mg in the morning and 500 mg in the evening, and Jardiance 25 mg daily for now.  Continue atorvastatin 20 mg daily; LDL goal is less than 70.        Relevant Orders   Hemoglobin A1c (Completed)   Lipid panel (Completed)   Microalbumin, urine (Completed)   Proliferative diabetic retinopathy of left eye with macular edema associated with type 2 diabetes mellitus ([place])    Continue collaboration  with ophthalmology and [place] [facility].        Musculoskeletal and Integument   Seropositive rheumatoid arthritis ([place])    Continue collaboration with Rheumatology.        Follow up plan: Return for with new PCP.

## 2021-08-27 ENCOUNTER — Encounter: Payer: Self-pay | Admitting: Nurse Practitioner

## 2021-08-27 LAB — LIPID PANEL
Cholesterol: 96 mg/dL (ref <200)
HDL: 31 mg/dL — ABNORMAL LOW (ref >=50)
LDL Cholesterol (Calc): 46 mg/dL (calc)
Non-HDL Cholesterol (Calc): 65 mg/dL (calc) (ref <130)
Total CHOL/HDL Ratio: 3.1 (calc) (ref <5.0)
Triglycerides: 107 mg/dL (ref <150)

## 2021-08-27 LAB — HEMOGLOBIN A1C
Hgb A1c MFr Bld: 7.9 % of total Hgb — ABNORMAL HIGH (ref <5.7)
Mean Plasma Glucose: 180 mg/dL
eAG (mmol/L): 10 mmol/L

## 2021-08-27 LAB — MICROALBUMIN, URINE: Microalb, Ur: <0.2 mg/dL

## 2021-08-27 NOTE — Assessment & Plan Note (Signed)
Chronic.  A1c goal less than 6.5%.  If A1c less than goal, can decrease on medication.  Also check urine microalbumin and lipid panel, kidney function with electrolytes today.  Eye exam is up to date.  Foot exam today is normal.  Continue collaboration with Retina specialist.  Continue glipizide XL 7.5 mg daily, Metformin 1000 mg in the morning and 500 mg in the evening, and Jardiance 25 mg daily for now.  Continue atorvastatin 20 mg daily; LDL goal is less than 70.

## 2021-08-27 NOTE — Assessment & Plan Note (Signed)
Continue collaboration with Rheumatology.

## 2021-08-27 NOTE — Assessment & Plan Note (Signed)
Continue collaboration with ophthalmology and [place] [facility]ist.

## 2021-09-03 ENCOUNTER — Other Ambulatory Visit: Payer: Self-pay | Admitting: Nurse Practitioner

## 2021-09-07 ENCOUNTER — Other Ambulatory Visit: Payer: Self-pay | Admitting: Internal Medicine

## 2021-09-07 ENCOUNTER — Other Ambulatory Visit: Payer: Self-pay | Admitting: Nurse Practitioner

## 2021-09-07 DIAGNOSIS — M059 Rheumatoid arthritis with rheumatoid factor, unspecified: Secondary | ICD-10-CM

## 2021-09-11 ENCOUNTER — Other Ambulatory Visit: Payer: Self-pay | Admitting: Nurse Practitioner

## 2021-09-14 NOTE — Progress Notes (Signed)
Office Visit Note  Patient: Brooke Sawyer             Date of Birth: Jan 08, 1961           MRN: [id number]             PCP: Eulogio Bear, NP Referring: Eulogio Bear, NP Visit Date: 09/15/2021   Subjective:  Rheumatoid Arthritis (Doing good)   History of Present Illness: Brooke Sawyer is a 61 y.o. female here for follow up for seropositive RA on methotrexate 20 mg PO weekly and hydroxychloroquine 200 mg daily. She feels symptoms are very well controlled. No significant morning stiffness. No interval complications or infections.   Previous HPI 06/17/21 Brooke Sawyer is a 61 y.o. female here for follow up after initial visit for transfer of care for rheumatoid arthritis initially to continue treatment with methotrexate 20 mg PO weekly. Lab tests at initial visit showing ESR of 34 minimal elevation baseline CBC and CMP unremarkable. Since our last visit she feels symptoms are doing pretty well she finished taking the steroids uneventfully. She saw optometrist told her she needs to see the ophthalmologist, says the last visit in 2020 workup was extensive and had a patient cost of $500 so has not been regularly following up with Dr. Coralyn Pear recently.   Previous HPI 06/03/21 Brooke Sawyer is a 61 y.o. female here for seropositive, erosive RA previously managed at [facility] clinic rheumatology. She was originally diagnosed in 2019 with symptoms of bilateral finger joints pain and swelling and decreased mobility. Rheumatology workup showing positive RF, CCP, and erosive disease on xray imaging and she started treatment with methotrexate and folic acid with initial 1 week prednisone taper. She has felt overall well controlled disease on this regimen. Some ongoing swelling led to adding hydroxychloroquine to methotrexate but had a hard time getting this due to COVID related acces issues so never stayed on it. She changed insurance due to her husband retiring on medicare benefits and unable to  follow up with [facility] on new insurance. She is off methotrexate treatment for several months and has significant finger joint swelling, stiffness, and pain. She gets partial relief with NSAIDs which she currently uses to treated her finger pain and swelling. She broke her 4th and 5th does from a minor trauma with losing her balance. She has never suffered a major bone fracture. She takes daily vitamin D supplementation but reports never having bone density screening for osteoporosis.   Review of Systems  Constitutional:  Negative for fatigue.  HENT:  Negative for mouth dryness.   Eyes:  Negative for dryness.  Respiratory:  Negative for shortness of breath.   Cardiovascular:  Negative for swelling in legs/feet.  Gastrointestinal:  Negative for constipation.  Endocrine: Negative for cold intolerance.  Genitourinary:  Negative for difficulty urinating.  Musculoskeletal:  Negative for morning stiffness.  Skin:  Negative for rash.  Allergic/Immunologic: Negative for susceptible to infections.  Neurological:  Negative for numbness.  Hematological:  Negative for bruising/bleeding tendency.  Psychiatric/Behavioral:  Negative for sleep disturbance.    PMFS History:  Patient Active Problem List   Diagnosis Date Noted   High risk medication use 06/03/2021   Proliferative diabetic retinopathy of left eye with macular edema associated with type 2 diabetes mellitus ([place]) 05/27/2021   Type 2 diabetes mellitus with diabetic retinopathy ([place]) 05/25/2021   Seropositive rheumatoid arthritis ([place]) 04/14/2018   Atypical squamous cell changes of undetermined significance (ASCUS) on vaginal cytology  08/23/2013   Partial nontraumatic tear of rotator cuff 10/18/2012   Rotator cuff syndrome of right shoulder 10/18/2012    Past Medical History:  Diagnosis Date   Diabetes mellitus without complication (HCC)    Rheumatoid arthritis (HCC)    Rotator cuff tear     Family History  Problem Relation Age  of Onset   Diabetes Mother    Heart disease Mother    Diabetes Father    COPD Sister    Hypertension Brother    Berenice Primas' disease Daughter    Colon cancer Neg Hx    Past Surgical History:  Procedure Laterality Date   COLONOSCOPY N/A 09/27/2012   Procedure: COLONOSCOPY;  Surgeon: Rogene Houston, MD;  [facility];  Service: Endoscopy;  Laterality: N/A;  1030   Dilation and Currettage     TUBAL LIGATION     Social History   Social History Narrative   Not on file   Immunization History  Administered Date(s) Administered   Influenza Inj Mdck Quad Pf 05/12/2018, 04/21/2019   Influenza,inj,Quad PF,6+ Mos 05/07/2017   Influenza-Unspecified 05/11/2021   Moderna SARS-COV2 Booster Vaccination 08/11/2021   Moderna Sars-Covid-2 Vaccination 11/08/2019, 12/04/2019     Objective: Vital Signs: BP 117/67 (BP Location: Left Arm, Patient Position: Sitting, Cuff Size: Normal)    Pulse 72    Resp 15    Ht _0  (1.626 m)    Wt 209 lb (94.8 kg)    LMP 05/25/2016    BMI 35.87 kg/m    Physical Exam Constitutional:      Appearance: She is obese.  Eyes:     Conjunctiva/sclera: Conjunctivae normal.  Cardiovascular:     Rate and Rhythm: Normal rate and regular rhythm.  Pulmonary:     Effort: Pulmonary effort is normal.     Breath sounds: Normal breath sounds.  Musculoskeletal:     Right lower leg: No edema.     Left lower leg: No edema.  Skin:    General: Skin is warm and dry.     Findings: No rash.  Neurological:     Mental Status: She is alert.  Psychiatric:        Mood and Affect: Mood normal.     Musculoskeletal Exam:  Shoulders full ROM no tenderness or swelling Elbows full ROM no tenderness or swelling Wrists full ROM no tenderness or swelling Fingers slight deviation of right 3rd PIP rest normal and full ROM, no tenderness Knees full ROM no tenderness or swelling Ankles full ROM no tenderness or swelling   CDAI Exam: CDAI Score: 0  Patient Global: 0 mm; Provider  Global: 0 mm Swollen: 0 ; Tender: 0  Joint Exam 09/15/2021   All documented joints were normal     Investigation: No additional findings.  Imaging: No results found.  Recent Labs: Lab Results  Component Value Date   WBC 7.1 06/17/2021   HGB 11.7 06/17/2021   PLT 248 06/17/2021   NA 141 06/17/2021   K 4.1 06/17/2021   CL 104 06/17/2021   CO2 26 06/17/2021   GLUCOSE 167 (H) 06/17/2021   BUN 13 06/17/2021   CREATININE 0.64 06/17/2021   BILITOT 0.3 06/17/2021   AST 14 06/17/2021   ALT 16 06/17/2021   PROT 6.8 06/17/2021   CALCIUM 8.9 06/17/2021   GFRAA  05/15/2008    >60        The eGFR has been calculated using the MDRD equation. This calculation has not been validated in  all clinical    Speciality Comments: No specialty comments available.  Procedures:  No procedures performed Allergies: Patient has no known allergies.   Assessment / Plan:     Visit Diagnoses: Seropositive rheumatoid arthritis ([place]) - Plan: methotrexate (RHEUMATREX) 2.5 MG tablet, folic acid (FOLVITE) 1 MG tablet, Sedimentation rate  Disease appears very well controlled low activity or remission. Checking sed rate which was still mildly elevated last visit. Mild existing joint damage. Plan to continue methotrexate 20 mg PO weekly, HCQ 200 mg daily, and folic acid 1 mg daily.  High risk medication use - Plan: CBC with Differential/Platelet, COMPLETE METABOLIC PANEL WITH GFR  Checking CBC and CMP for methotrexate medication monitoring today.  Orders: Orders Placed This Encounter  Procedures   Sedimentation rate   CBC with Differential/Platelet   COMPLETE METABOLIC PANEL WITH GFR   Meds ordered this encounter  Medications   methotrexate (RHEUMATREX) 2.5 MG tablet    Sig: Take 8 tablets (20 mg total) by mouth once a week. Caution:Chemotherapy. Protect from light.    Dispense:  40 tablet    Refill:  2   folic acid (FOLVITE) 1 MG tablet    Sig: Take 1 tablet (1 mg total) by mouth daily.     Dispense:  90 tablet    Refill:  0     Follow-Up Instructions: Return in about 3 months (around 12/13/2021) for RA on MTX/HCQ f/u 22mo.   CCollier Salina MD  Note - This record has been created using [employer]  Chart creation errors have been sought, but may not always  have been located. Such creation errors do not reflect on  the standard of medical care.

## 2021-09-15 ENCOUNTER — Encounter: Payer: Self-pay | Admitting: Internal Medicine

## 2021-09-15 ENCOUNTER — Ambulatory Visit (INDEPENDENT_AMBULATORY_CARE_PROVIDER_SITE_OTHER): Payer: 59 | Admitting: Internal Medicine

## 2021-09-15 ENCOUNTER — Other Ambulatory Visit: Payer: Self-pay

## 2021-09-15 VITALS — BP 117/67 | HR 72 | Resp 15 | Ht 64.0 in | Wt 209.0 lb

## 2021-09-15 DIAGNOSIS — Z79899 Other long term (current) drug therapy: Secondary | ICD-10-CM | POA: Diagnosis not present

## 2021-09-15 DIAGNOSIS — M059 Rheumatoid arthritis with rheumatoid factor, unspecified: Secondary | ICD-10-CM | POA: Diagnosis not present

## 2021-09-15 MED ORDER — METHOTREXATE 2.5 MG PO TABS: 20.0000 mg | ORAL_TABLET | ORAL | 2 refills | Status: DC

## 2021-09-15 MED ORDER — FOLIC ACID 1 MG PO TABS: 1.0000 mg | ORAL_TABLET | Freq: Every day | ORAL | 0 refills | Status: DC

## 2021-09-16 LAB — COMPLETE METABOLIC PANEL WITH GFR
AG Ratio: 1.3 (calc) (ref 1.0–2.5)
ALT: 17 U/L (ref 6–29)
AST: 16 U/L (ref 10–35)
Albumin: 3.9 g/dL (ref 3.6–5.1)
Alkaline phosphatase (APISO): 91 U/L (ref 37–153)
BUN: 11 mg/dL (ref 7–25)
CO2: 26 mmol/L (ref 20–32)
Calcium: 9.2 mg/dL (ref 8.6–10.4)
Chloride: 107 mmol/L (ref 98–110)
Creat: 0.72 mg/dL (ref 0.50–1.05)
Globulin: 3 g/dL (calc) (ref 1.9–3.7)
Glucose, Bld: 154 mg/dL — ABNORMAL HIGH (ref 65–99)
Potassium: 4.3 mmol/L (ref 3.5–5.3)
Sodium: 141 mmol/L (ref 135–146)
Total Bilirubin: 0.3 mg/dL (ref 0.2–1.2)
Total Protein: 6.9 g/dL (ref 6.1–8.1)
eGFR: 95 mL/min/{1.73_m2} (ref >=60)

## 2021-09-16 LAB — CBC WITH DIFFERENTIAL/PLATELET
Absolute Monocytes: 420 cells/uL (ref 200–950)
Basophils Absolute: 63 cells/uL (ref 0–200)
Basophils Relative: 0.9 %
Eosinophils Absolute: 371 cells/uL (ref 15–500)
Eosinophils Relative: 5.3 %
HCT: 35.4 % (ref 35.0–45.0)
Hemoglobin: 11.4 g/dL — ABNORMAL LOW (ref 11.7–15.5)
Lymphs Abs: 1890 cells/uL (ref 850–3900)
MCH: 27.4 pg (ref 27.0–33.0)
MCHC: 32.2 g/dL (ref 32.0–36.0)
MCV: 85.1 fL (ref 80.0–100.0)
MPV: 11.8 fL (ref 7.5–12.5)
Monocytes Relative: 6 %
Neutro Abs: 4256 cells/uL (ref 1500–7800)
Neutrophils Relative %: 60.8 %
Platelets: 265 10*3/uL (ref 140–400)
RBC: 4.16 10*6/uL (ref 3.80–5.10)
RDW: 15.4 % — ABNORMAL HIGH (ref 11.0–15.0)
Total Lymphocyte: 27 %
WBC: 7 10*3/uL (ref 3.8–10.8)

## 2021-09-16 LAB — SEDIMENTATION RATE: Sed Rate: 25 mm/h (ref 0–30)

## 2021-09-16 NOTE — Progress Notes (Signed)
Lab results look great no problems with methotrexate and inflammatory markers improved now down to normal.

## 2021-09-29 NOTE — Telephone Encounter (Signed)
Completed and this is now in insurance responsibility

## 2021-10-09 ENCOUNTER — Other Ambulatory Visit: Payer: Self-pay | Admitting: Internal Medicine

## 2021-10-09 DIAGNOSIS — M059 Rheumatoid arthritis with rheumatoid factor, unspecified: Secondary | ICD-10-CM

## 2021-10-26 ENCOUNTER — Encounter: Payer: Self-pay | Admitting: Obstetrics & Gynecology

## 2021-10-26 ENCOUNTER — Ambulatory Visit (INDEPENDENT_AMBULATORY_CARE_PROVIDER_SITE_OTHER): Payer: 59 | Admitting: Obstetrics & Gynecology

## 2021-10-26 ENCOUNTER — Other Ambulatory Visit: Payer: Self-pay

## 2021-10-26 VITALS — BP 122/64 | HR 69 | Ht 64.0 in | Wt 211.0 lb

## 2021-10-26 DIAGNOSIS — Z01419 Encounter for gynecological examination (general) (routine) without abnormal findings: Secondary | ICD-10-CM | POA: Diagnosis not present

## 2021-10-26 DIAGNOSIS — Z1211 Encounter for screening for malignant neoplasm of colon: Secondary | ICD-10-CM | POA: Diagnosis not present

## 2021-10-26 LAB — HEMOCCULT GUIAC POC 1CARD (OFFICE): Fecal Occult Blood, POC: NEGATIVE

## 2021-10-26 NOTE — Progress Notes (Signed)
Subjective:  ?  ? Brooke Sawyer is a 61 y.o. female here for a routine exam.  Patient's last menstrual period was 05/25/2016. G2P2 ?Birth Control Method:  post menopausal  ?Menstrual Calendar(currently): amenorrheic  ?Current complaints: none.  ? ?Current acute medical issues:  RA and diabetes ?  ?Recent Gynecologic History ?Patient's last menstrual period was 05/25/2016. ?Last Pap: 2022,   ?Last mammogram: 12/22,  normal ? ?Past Medical History:  ?Diagnosis Date  ? Diabetes mellitus without complication ([place])   ? Rheumatoid arthritis ([place])   ? Rotator cuff tear   ? ? ?Past Surgical History:  ?Procedure Laterality Date  ? COLONOSCOPY N/A 09/27/2012  ? Procedure: COLONOSCOPY;  Surgeon: Rogene Houston, MD;  [facility];  Service: Endoscopy;  Laterality: N/A;  1030  ? Dilation and Currettage    ? TUBAL LIGATION    ? ? ?OB History   ? ? Gravida  ?2  ? Para  ?2  ? Term  ?   ? Preterm  ?   ? AB  ?   ? Living  ?2  ?  ? ? SAB  ?   ? IAB  ?   ? Ectopic  ?   ? Multiple  ?   ? Live Births  ?   ?   ?  ?  ? ? ?Social History  ? ?Socioeconomic History  ? Marital status: Married  ?  Spouse name: Not on file  ? Number of children: Not on file  ? Years of education: Not on file  ? Highest education level: Not on file  ?Occupational History  ? Not on file  ?Tobacco Use  ? Smoking status: Former  ?  Packs/day: 0.50  ?  Years: 6.00  ?  Pack years: 3.00  ?  Types: Cigarettes  ?  Quit date: 6  ?  Years since quitting: 31.2  ? Smokeless tobacco: Never  ?Vaping Use  ? Vaping Use: Never used  ?Substance and Sexual Activity  ? Alcohol use: No  ? Drug use: No  ? Sexual activity: Yes  ?  Birth control/protection: Surgical  ?  Comment: tubal  ?Other Topics Concern  ? Not on file  ?Social History Narrative  ? Not on file  ? ?Social Determinants of Health  ? ?Financial Resource Strain: Not on file  ?Food Insecurity: No Food Insecurity  ? Worried About Charity fundraiser in the Last Year: Never true  ? Ran Out of Food in the Last  Year: Never true  ?Transportation Needs: No Transportation Needs  ? Lack of Transportation (Medical): No  ? Lack of Transportation (Non-Medical): No  ?Physical Activity: Insufficiently Active  ? Days of Exercise per Week: 3 days  ? Minutes of Exercise per Session: 30 min  ?Stress: No Stress Concern Present  ? Feeling of Stress : Not at all  ?Social Connections: Socially Integrated  ? Frequency of Communication with Friends and Family: More than three times a week  ? Frequency of Social Gatherings with Friends and Family: Never  ? Attends Religious Services: More than 4 times per year  ? Active Member of Clubs or Organizations: Yes  ? Attends Archivist Meetings: Never  ? Marital Status: Married  ? ? ?Family History  ?Problem Relation Age of Onset  ? Diabetes Mother   ? Heart disease Mother   ? Diabetes Father   ? COPD Sister   ? Hypertension Brother   ? Graves' disease Daughter   ?  Colon cancer Neg Hx   ? ? ? ?Current Outpatient Medications:  ?  aspirin EC 81 MG tablet, Take 81 mg by mouth daily., Disp: , Rfl:  ?  atorvastatin (LIPITOR) 20 MG tablet, TAKE 1 TABLET BY MOUTH EVERYDAY AT BEDTIME, Disp: 90 tablet, Rfl: 1 ?  cholecalciferol (VITAMIN D) 1000 UNITS tablet, Take 1,000 Units by mouth daily., Disp: , Rfl:  ?  Cyanocobalamin (B-12 PO), Take 1 tablet by mouth daily., Disp: , Rfl:  ?  folic acid (FOLVITE) 1 MG tablet, Take 1 tablet (1 mg total) by mouth daily., Disp: 90 tablet, Rfl: 0 ?  glipiZIDE (GLUCOTROL XL) 2.5 MG 24 hr tablet, TAKE 3 TABLETS (7.'5MG'$  TOTAL) ONCE DAILY, Disp: 270 tablet, Rfl: 3 ?  hydroxychloroquine (PLAQUENIL) 200 MG tablet, TAKE 1 TABLET BY MOUTH EVERY DAY, Disp: 30 tablet, Rfl: 2 ?  IBUPROFEN PO, Take by mouth as needed., Disp: , Rfl:  ?  JARDIANCE 25 MG TABS tablet, Take 25 mg by mouth every morning., Disp: , Rfl:  ?  metFORMIN (GLUCOPHAGE) 500 MG tablet, TAKE 2 TABLETS BY MOUTH TWICE A DAY WITH MORNING AND EVENING MEALS, Disp: 360 tablet, Rfl: 3 ?  methotrexate (RHEUMATREX) 2.5  MG tablet, TAKE 8 TABLETS (20 MG TOTAL) BY MOUTH ONCE A WEEK. CAUTION:CHEMOTHERAPY. PROTECT FROM LIGHT., Disp: 104 tablet, Rfl: 0 ? ?Review of Systems ? ?Review of Systems  ?Constitutional: Negative for fever, chills, weight loss, malaise/fatigue and diaphoresis.  ?HENT: Negative for hearing loss, ear pain, nosebleeds, congestion, sore throat, neck pain, tinnitus and ear discharge.   ?Eyes: Negative for blurred vision, double vision, photophobia, pain, discharge and redness.  ?Respiratory: Negative for cough, hemoptysis, sputum production, shortness of breath, wheezing and stridor.   ?Cardiovascular: Negative for chest pain, palpitations, orthopnea, claudication, leg swelling and PND.  ?Gastrointestinal: negative for abdominal pain. Negative for heartburn, nausea, vomiting, diarrhea, constipation, blood in stool and melena.  ?Genitourinary: Negative for dysuria, urgency, frequency, hematuria and flank pain.  ?Musculoskeletal: Negative for myalgias, back pain, joint pain and falls.  ?Skin: Negative for itching and rash.  ?Neurological: Negative for dizziness, tingling, tremors, sensory change, speech change, focal weakness, seizures, loss of consciousness, weakness and headaches.  ?Endo/Heme/Allergies: Negative for environmental allergies and polydipsia. Does not bruise/bleed easily.  ?Psychiatric/Behavioral: Negative for depression, suicidal ideas, hallucinations, memory loss and substance abuse. The patient is not nervous/anxious and does not have insomnia.   ? ?  ?  ?Objective:  ?Blood pressure 122/64, pulse 69, height '5\' 4"'$  (1.626 m), weight 211 lb (95.7 kg), last menstrual period 05/25/2016.  ? Physical Exam  ?Vitals reviewed. ?Constitutional: She is oriented to person, place, and time. She appears well-developed and well-nourished.  ?HENT:  ?Head: Normocephalic and atraumatic.        ?Right Ear: External ear normal.  ?Left Ear: External ear normal.  ?Nose: Nose normal.  ?Mouth/Throat: Oropharynx is clear and  moist.  ?Eyes: Conjunctivae and EOM are normal. Pupils are equal, round, and reactive to light. Right eye exhibits no discharge. Left eye exhibits no discharge. No scleral icterus.  ?Neck: Normal range of motion. Neck supple. No tracheal deviation present. No thyromegaly present.  ?Cardiovascular: Normal rate, regular rhythm, normal heart sounds and intact distal pulses.  Exam reveals no gallop and no friction rub.   ?No murmur heard. ?Respiratory: Effort normal and breath sounds normal. No respiratory distress. She has no wheezes. She has no rales. She exhibits no tenderness.  ?GI: Soft. Bowel sounds are normal. She exhibits no distension  and no mass. There is no tenderness. There is no rebound and no guarding.  ?Genitourinary:  ?Breasts no masses skin changes or nipple changes bilaterally ?     Vulva is normal without lesions ?Vagina is pink moist without discharge ?Cervix normal in appearance and pap is done ?Uterus is normal size shape and contour ?Adnexa is negative with normal sized ovaries  ?{Rectal    hemoccult negative, normal tone, no masses  ?Musculoskeletal: Normal range of motion. She exhibits no edema and no tenderness.  ?Neurological: She is alert and oriented to person, place, and time. She has normal reflexes. She displays normal reflexes. No cranial nerve deficit. She exhibits normal muscle tone. Coordination normal.  ?Skin: Skin is warm and dry. No rash noted. No erythema. No pallor.  ?Psychiatric: She has a normal mood and affect. Her behavior is normal. Judgment and thought content normal.  ? ?   ? ?Medications Ordered at today's visit: ?No orders of the defined types were placed in this encounter. ? ? ?Other orders placed at today's visit: ?No orders of the defined types were placed in this encounter. ? ? ? ? ?Assessment:  ? ? Normal Gyn exam.  ?  ?Plan:  ? ?Yearly mammogram ?Repeat Pap cytology HPV 2 years ? ? ?No follow-ups on file. ? ?

## 2021-11-30 ENCOUNTER — Other Ambulatory Visit: Payer: Self-pay | Admitting: Internal Medicine

## 2021-11-30 DIAGNOSIS — M059 Rheumatoid arthritis with rheumatoid factor, unspecified: Secondary | ICD-10-CM

## 2021-12-07 NOTE — Progress Notes (Signed)
? ?Office Visit Note ? ?Patient: Brooke Sawyer             ?Date of Birth: 04-11-1961           ?MRN: [id number]             ?PCP: No primary care provider on file. ?Referring: Eulogio Bear, NP ?Visit Date: 12/16/2021 ? ? ?Subjective:  ? ?History of Present Illness: Brooke Sawyer is a 61 y.o. female here for follow up for seropositive RA on methotrexate 20 mg PO weekly and hydroxychloroquine 200 mg daily. Since our last visit she is doing well no increase in symptoms. No trouble with medications or other health events. ? ?Previous HPI ?09/15/2021 ?Brooke Sawyer is a 62 y.o. female here for follow up for seropositive RA on methotrexate 20 mg PO weekly and hydroxychloroquine 200 mg daily. She feels symptoms are very well controlled. No significant morning stiffness. No interval complications or infections.  ?  ?Previous HPI ?06/17/21 ?Brooke Sawyer is a 61 y.o. female here for follow up after initial visit for transfer of care for rheumatoid arthritis initially to continue treatment with methotrexate 20 mg PO weekly. Lab tests at initial visit showing ESR of 34 minimal elevation baseline CBC and CMP unremarkable. Since our last visit she feels symptoms are doing pretty well she finished taking the steroids uneventfully. She saw optometrist told her she needs to see the ophthalmologist, says the last visit in 2020 workup was extensive and had a patient cost of $500 so has not been regularly following up with Dr. Coralyn Pear recently. ?  ?Previous HPI ?06/03/21 ?Brooke Sawyer is a 61 y.o. female here for seropositive, erosive RA previously managed at [facility] clinic rheumatology. She was originally diagnosed in 2019 with symptoms of bilateral finger joints pain and swelling and decreased mobility. Rheumatology workup showing positive RF, CCP, and erosive disease on xray imaging and she started treatment with methotrexate and folic acid with initial 1 week prednisone taper. She has felt overall well controlled disease on this  regimen. Some ongoing swelling led to adding hydroxychloroquine to methotrexate but had a hard time getting this due to COVID related acces issues so never stayed on it. ?She changed insurance due to her husband retiring on medicare benefits and unable to follow up with [place] [facility] on new insurance. She is off methotrexate treatment for several months and has significant finger joint swelling, stiffness, and pain. She gets partial relief with NSAIDs which she currently uses to treated her finger pain and swelling. ?She broke her 4th and 5th does from a minor trauma with losing her balance. She has never suffered a major bone fracture. She takes daily vitamin D supplementation but reports never having bone density screening for osteoporosis. ? ? ?Review of Systems  ?Constitutional:  Negative for fatigue.  ?HENT:  Negative for mouth sores, mouth dryness and nose dryness.   ?Eyes:  Negative for pain, itching and dryness.  ?Respiratory:  Negative for shortness of breath and difficulty breathing.   ?Cardiovascular:  Negative for chest pain and palpitations.  ?Gastrointestinal:  Negative for blood in stool, constipation and diarrhea.  ?Endocrine: Negative for increased urination.  ?Genitourinary:  Negative for difficulty urinating.  ?Musculoskeletal:  Negative for joint pain, joint pain, joint swelling, myalgias, morning stiffness, muscle tenderness and myalgias.  ?Skin:  Negative for color change, rash and redness.  ?Allergic/Immunologic: Negative for susceptible to infections.  ?Neurological:  Negative for dizziness, numbness, headaches, memory loss and weakness.  ?  Hematological:  Negative for bruising/bleeding tendency.  ?Psychiatric/Behavioral:  Negative for confusion.   ? ?PMFS History:  ?Patient Active Problem List  ? Diagnosis Date Noted  ? High risk medication use 06/03/2021  ? Proliferative diabetic retinopathy of left eye with macular edema associated with type 2 diabetes mellitus ([place]) 05/27/2021  ? Type 2  diabetes mellitus with diabetic retinopathy ([place]) 05/25/2021  ? Seropositive rheumatoid arthritis ([place]) 04/14/2018  ? Atypical squamous cell changes of undetermined significance (ASCUS) on vaginal cytology 08/23/2013  ? Partial nontraumatic tear of rotator cuff 10/18/2012  ? Rotator cuff syndrome of right shoulder 10/18/2012  ?  ?Past Medical History:  ?Diagnosis Date  ? Diabetes mellitus without complication ([place])   ? Rheumatoid arthritis ([place])   ? Rotator cuff tear   ?  ?Family History  ?Problem Relation Age of Onset  ? Diabetes Mother   ? Heart disease Mother   ? Diabetes Father   ? COPD Sister   ? Hypertension Brother   ? Graves' disease Daughter   ? Colon cancer Neg Hx   ? ?Past Surgical History:  ?Procedure Laterality Date  ? COLONOSCOPY N/A 09/27/2012  ? Procedure: COLONOSCOPY;  Surgeon: Rogene Houston, MD;  [facility];  Service: Endoscopy;  Laterality: N/A;  1030  ? Dilation and Currettage    ? TUBAL LIGATION    ? ?Social History  ? ?Social History Narrative  ? Not on file  ? ?Immunization History  ?Administered Date(s) Administered  ? Influenza Inj Mdck Quad Pf 05/12/2018, 04/21/2019  ? Influenza,inj,Quad PF,6+ Mos 05/07/2017  ? Influenza-Unspecified 05/11/2021  ? Moderna SARS-COV2 Booster Vaccination 08/11/2021  ? Moderna Sars-Covid-2 Vaccination 11/08/2019, 12/04/2019  ?  ? ?Objective: ?Vital Signs: BP 127/74 (BP Location: Left Arm, Patient Position: Sitting, Cuff Size: Normal)   Pulse 84   Ht _0  (1.626 m)   Wt 213 lb (96.6 kg)   LMP 05/25/2016   BMI 36.56 kg/m?   ? ?Physical Exam ?Constitutional:   ?   Appearance: She is obese.  ?Cardiovascular:  ?   Rate and Rhythm: Normal rate and regular rhythm.  ?Pulmonary:  ?   Effort: Pulmonary effort is normal.  ?   Breath sounds: Normal breath sounds.  ?Skin: ?   General: Skin is warm and dry.  ?   Findings: No rash.  ?Neurological:  ?   Mental Status: She is alert.  ?Psychiatric:     ?   Mood and Affect: Mood normal.  ?  ? ?Musculoskeletal  Exam:  ?Shoulders full ROM no tenderness or swelling ?Elbows full ROM no tenderness or swelling ?Wrists full ROM no tenderness or swelling ?Fingers full ROM no tenderness or swelling, chronic soft tissue thickening at MCPs and right 3rd PIP joint ?Knees full ROM no tenderness or swelling ? ? ?CDAI Exam: ?CDAI Score: -- ?Patient Global: --; Provider Global: -- ?Swollen: 0 ; Tender: 0  ?Joint Exam 12/16/2021  ? ?All documented joints were normal  ? ? ? ?Investigation: ?No additional findings. ? ?Imaging: ?No results found. ? ?Recent Labs: ?Lab Results  ?Component Value Date  ? WBC 7.0 09/15/2021  ? HGB 11.4 (L) 09/15/2021  ? PLT 265 09/15/2021  ? NA 141 09/15/2021  ? K 4.3 09/15/2021  ? CL 107 09/15/2021  ? CO2 26 09/15/2021  ? GLUCOSE 154 (H) 09/15/2021  ? BUN 11 09/15/2021  ? CREATININE 0.72 09/15/2021  ? BILITOT 0.3 09/15/2021  ? AST 16 09/15/2021  ? ALT 17 09/15/2021  ?  PROT 6.9 09/15/2021  ? CALCIUM 9.2 09/15/2021  ? GFRAA  05/15/2008  ?  >60        ?The eGFR has been calculated ?using the MDRD equation. ?This calculation has not been ?validated in all clinical  ? ? ?Speciality Comments: No specialty comments available. ? ?Procedures:  ?No procedures performed ?Allergies: Patient has no known allergies.  ? ?Assessment / Plan:     ?Visit Diagnoses: Seropositive rheumatoid arthritis ([place]) - Disease appears very well controlled low activity or remission - Plan: Sedimentation rate ? ?She is doing very well appears to be in remission on current treatment. Soft tissue changes on right hand appear chronic, developed while off treatment so far no progression after resuming. Plan to continue MTX 20 mg PO weekly and HCQ 659 mg daily and folic acid 1 mg daily. ? ?High risk medication use - methotrexate 20 mg PO weekly, HCQ 200 mg daily, and folic acid 1 mg daily. PLQ eye exam? - Plan: CBC with Differential/Platelet, COMPLETE METABOLIC PANEL WITH GFR ? ?Checking CBC and CMP for methotrexate toxicity  monitoring. ? ?Orders: ?Orders Placed This Encounter  ?Procedures  ? Sedimentation rate  ? CBC with Differential/Platelet  ? COMPLETE METABOLIC PANEL WITH GFR  ? ?Meds ordered this encounter  ?Medications  ? folic acid (FOLVITE) 1

## 2021-12-15 ENCOUNTER — Other Ambulatory Visit: Payer: Self-pay | Admitting: Internal Medicine

## 2021-12-15 DIAGNOSIS — M059 Rheumatoid arthritis with rheumatoid factor, unspecified: Secondary | ICD-10-CM

## 2021-12-16 ENCOUNTER — Ambulatory Visit: Payer: 59 | Admitting: Internal Medicine

## 2021-12-16 ENCOUNTER — Encounter: Payer: Self-pay | Admitting: Internal Medicine

## 2021-12-16 VITALS — BP 127/74 | HR 84 | Ht 64.0 in | Wt 213.0 lb

## 2021-12-16 DIAGNOSIS — M059 Rheumatoid arthritis with rheumatoid factor, unspecified: Secondary | ICD-10-CM

## 2021-12-16 DIAGNOSIS — Z79899 Other long term (current) drug therapy: Secondary | ICD-10-CM | POA: Diagnosis not present

## 2021-12-16 MED ORDER — FOLIC ACID 1 MG PO TABS: 1.0000 mg | ORAL_TABLET | Freq: Every day | ORAL | 1 refills | Status: DC

## 2021-12-16 MED ORDER — HYDROXYCHLOROQUINE SULFATE 200 MG PO TABS: 200.0000 mg | ORAL_TABLET | Freq: Every day | ORAL | 0 refills | Status: DC

## 2021-12-16 MED ORDER — METHOTREXATE 2.5 MG PO TABS: 20.0000 mg | ORAL_TABLET | ORAL | 0 refills | Status: DC

## 2021-12-17 LAB — CBC WITH DIFFERENTIAL/PLATELET
Absolute Monocytes: 521 cells/uL (ref 200–950)
Basophils Absolute: 73 cells/uL (ref 0–200)
Basophils Relative: 1.1 %
Eosinophils Absolute: 389 cells/uL (ref 15–500)
Eosinophils Relative: 5.9 %
HCT: 35.8 % (ref 35.0–45.0)
Hemoglobin: 11.3 g/dL — ABNORMAL LOW (ref 11.7–15.5)
Lymphs Abs: 1967 cells/uL (ref 850–3900)
MCH: 27.2 pg (ref 27.0–33.0)
MCHC: 31.6 g/dL — ABNORMAL LOW (ref 32.0–36.0)
MCV: 86.3 fL (ref 80.0–100.0)
MPV: 12.1 fL (ref 7.5–12.5)
Monocytes Relative: 7.9 %
Neutro Abs: 3650 cells/uL (ref 1500–7800)
Neutrophils Relative %: 55.3 %
Platelets: 223 10*3/uL (ref 140–400)
RBC: 4.15 10*6/uL (ref 3.80–5.10)
RDW: 15.5 % — ABNORMAL HIGH (ref 11.0–15.0)
Total Lymphocyte: 29.8 %
WBC: 6.6 10*3/uL (ref 3.8–10.8)

## 2021-12-17 LAB — COMPLETE METABOLIC PANEL WITH GFR
AG Ratio: 1.3 (calc) (ref 1.0–2.5)
ALT: 14 U/L (ref 6–29)
AST: 13 U/L (ref 10–35)
Albumin: 4.1 g/dL (ref 3.6–5.1)
Alkaline phosphatase (APISO): 111 U/L (ref 37–153)
BUN: 15 mg/dL (ref 7–25)
CO2: 25 mmol/L (ref 20–32)
Calcium: 9.2 mg/dL (ref 8.6–10.4)
Chloride: 106 mmol/L (ref 98–110)
Creat: 0.91 mg/dL (ref 0.50–1.05)
Globulin: 3.1 g/dL (calc) (ref 1.9–3.7)
Glucose, Bld: 283 mg/dL — ABNORMAL HIGH (ref 65–99)
Potassium: 4.2 mmol/L (ref 3.5–5.3)
Sodium: 141 mmol/L (ref 135–146)
Total Bilirubin: 0.2 mg/dL (ref 0.2–1.2)
Total Protein: 7.2 g/dL (ref 6.1–8.1)
eGFR: 71 mL/min/{1.73_m2} (ref >=60)

## 2021-12-17 LAB — SEDIMENTATION RATE: Sed Rate: 25 mm/h (ref 0–30)

## 2021-12-17 NOTE — Progress Notes (Signed)
Lab results look fine for continuing current methotrexate and hydroxychloroquine. Has she seen ophthalmology for eye exam?

## 2021-12-24 ENCOUNTER — Ambulatory Visit: Payer: 59 | Admitting: Nurse Practitioner

## 2021-12-29 ENCOUNTER — Other Ambulatory Visit: Payer: Self-pay | Admitting: Internal Medicine

## 2021-12-29 DIAGNOSIS — M059 Rheumatoid arthritis with rheumatoid factor, unspecified: Secondary | ICD-10-CM

## 2022-02-17 ENCOUNTER — Other Ambulatory Visit (HOSPITAL_COMMUNITY): Payer: Self-pay | Admitting: Internal Medicine

## 2022-02-17 DIAGNOSIS — Z1231 Encounter for screening mammogram for malignant neoplasm of breast: Secondary | ICD-10-CM

## 2022-02-17 DIAGNOSIS — M810 Age-related osteoporosis without current pathological fracture: Secondary | ICD-10-CM

## 2022-03-24 NOTE — Progress Notes (Signed)
Office Visit Note  Patient: Brooke Sawyer             Date of Birth: 04/23/1961           MRN: [id number]             PCP: No primary care provider on file. Referring: No ref. provider found Visit Date: 03/30/2022   Subjective:  Follow-up (Feeling ok today. )   History of Present Illness: Brooke Sawyer is a 61 y.o. female here for follow up for seropositive RA on MTX 20 mg PO weekly and HCQ 200 mg daily. She feels symptoms are well controlled and no problems with medications. Discussed HCQ screening with her eye doctor at last visit apparently was not specifically addressing this before because it had not been discussed.  Previous HPI 12/16/2021 Brooke Sawyer is a 61 y.o. female here for follow up for seropositive RA on methotrexate 20 mg PO weekly and hydroxychloroquine 200 mg daily. Since our last visit she is doing well no increase in symptoms. No trouble with medications or other health events.   Previous HPI 09/15/2021 Brooke Sawyer is a 61 y.o. female here for follow up for seropositive RA on methotrexate 20 mg PO weekly and hydroxychloroquine 200 mg daily. She feels symptoms are very well controlled. No significant morning stiffness. No interval complications or infections.    Previous HPI 06/17/21 Brooke Sawyer is a 61 y.o. female here for follow up after initial visit for transfer of care for rheumatoid arthritis initially to continue treatment with methotrexate 20 mg PO weekly. Lab tests at initial visit showing ESR of 34 minimal elevation baseline CBC and CMP unremarkable. Since our last visit she feels symptoms are doing pretty well she finished taking the steroids uneventfully. She saw optometrist told her she needs to see the ophthalmologist, says the last visit in 2020 workup was extensive and had a patient cost of $500 so has not been regularly following up with Dr. Coralyn Pear recently.   Previous HPI 06/03/21 Brooke Sawyer is a 61 y.o. female here for seropositive, erosive RA  previously managed at [facility] clinic rheumatology. She was originally diagnosed in 2019 with symptoms of bilateral finger joints pain and swelling and decreased mobility. Rheumatology workup showing positive RF, CCP, and erosive disease on xray imaging and she started treatment with methotrexate and folic acid with initial 1 week prednisone taper. She has felt overall well controlled disease on this regimen. Some ongoing swelling led to adding hydroxychloroquine to methotrexate but had a hard time getting this due to COVID related acces issues so never stayed on it. She changed insurance due to her husband retiring on medicare benefits and unable to follow up with [place] clinic on new insurance. She is off methotrexate treatment for several months and has significant finger joint swelling, stiffness, and pain. She gets partial relief with NSAIDs which she currently uses to treated her finger pain and swelling. She broke her 4th and 5th does from a minor trauma with losing her balance. She has never suffered a major bone fracture. She takes daily vitamin D supplementation but reports never having bone density screening for osteoporosis.     Review of Systems  Constitutional:  Negative for fatigue.  HENT:  Negative for mouth sores and mouth dryness.   Eyes:  Negative for dryness.  Respiratory:  Negative for shortness of breath.   Cardiovascular:  Negative for chest pain and palpitations.  Gastrointestinal:  Negative for blood  in stool, constipation and diarrhea.  Endocrine: Negative for increased urination.  Genitourinary:  Negative for involuntary urination.  Musculoskeletal:  Negative for joint pain, gait problem, joint pain, joint swelling, myalgias, muscle weakness, morning stiffness, muscle tenderness and myalgias.  Skin:  Negative for color change, rash, hair loss and sensitivity to sunlight.  Allergic/Immunologic: Negative for susceptible to infections.  Neurological:  Negative for  dizziness and headaches.  Hematological:  Negative for swollen glands.  Psychiatric/Behavioral:  Negative for depressed mood and sleep disturbance. The patient is not nervous/anxious.     PMFS History:  Patient Active Problem List   Diagnosis Date Noted   High risk medication use 06/03/2021   Proliferative diabetic retinopathy of left eye with macular edema associated with type 2 diabetes mellitus ([place]) 05/27/2021   Type 2 diabetes mellitus with diabetic retinopathy ([place]) 05/25/2021   Seropositive rheumatoid arthritis ([place]) 04/14/2018   Atypical squamous cell changes of undetermined significance (ASCUS) on vaginal cytology 08/23/2013   Partial nontraumatic tear of rotator cuff 10/18/2012   Rotator cuff syndrome of right shoulder 10/18/2012    Past Medical History:  Diagnosis Date   Diabetes mellitus without complication (H[facility])    Rheumatoid arthritis (H[facility])    Rotator cuff tear     Family History  Problem Relation Age of Onset   Diabetes Mother    Heart disease Mother    Diabetes Father    COPD Sister    Hypertension Brother    Berenice Primas' disease Daughter    Colon cancer Neg Hx    Past Surgical History:  Procedure Laterality Date   COLONOSCOPY N/A 09/27/2012   Procedure: COLONOSCOPY;  Surgeon: Rogene Houston, MD;  [facility];  Service: Endoscopy;  Laterality: N/A;  1030   Dilation and Currettage     TUBAL LIGATION     Social History   Social History Narrative   Not on file   Immunization History  Administered Date(s) Administered   Influenza Inj Mdck Quad Pf 05/12/2018, 04/21/2019   Influenza,inj,Quad PF,6+ Mos 05/07/2017   Influenza-Unspecified 05/11/2021   Moderna SARS-COV2 Booster Vaccination 08/11/2021   Moderna Sars-Covid-2 Vaccination 11/08/2019, 12/04/2019     Objective: Vital Signs: BP 113/67 (BP Location: Left Arm, Patient Position: Sitting, Cuff Size: Large)   Pulse 65   Resp 15   Ht 5' 4" (1.626 m)   Wt 215 lb 12.8 oz (97.9 kg)   LMP  05/25/2016   BMI 37.04 kg/m    Physical Exam Constitutional:      Appearance: She is obese.  Cardiovascular:     Rate and Rhythm: Normal rate and regular rhythm.  Pulmonary:     Effort: Pulmonary effort is normal.     Breath sounds: Normal breath sounds.  Musculoskeletal:     Right lower leg: No edema.     Left lower leg: No edema.  Skin:    General: Skin is warm and dry.     Findings: No rash.  Neurological:     Mental Status: She is alert.  Psychiatric:        Mood and Affect: Mood normal.      Musculoskeletal Exam:  Shoulders full ROM no tenderness or swelling Elbows full ROM no tenderness or swelling Wrists full ROM no tenderness or swelling Fingers full ROM no tenderness or swelling, chronic thickening of MCP joints and right 3rd PIP Knees full ROM no tenderness or swelling Ankles full ROM no tenderness or swelling   CDAI Exam: CDAI Score: 2  Patient Global: 10 mm; Provider Global: 10 mm Swollen: 0 ; Tender: 0  Joint Exam 03/30/2022   All documented joints were normal     Investigation: No additional findings.  Imaging: No results found.  Recent Labs: Lab Results  Component Value Date   WBC 6.6 12/16/2021   HGB 11.3 (L) 12/16/2021   PLT 223 12/16/2021   NA 141 12/16/2021   K 4.2 12/16/2021   CL 106 12/16/2021   CO2 25 12/16/2021   GLUCOSE 283 (H) 12/16/2021   BUN 15 12/16/2021   CREATININE 0.91 12/16/2021   BILITOT 0.2 12/16/2021   AST 13 12/16/2021   ALT 14 12/16/2021   PROT 7.2 12/16/2021   CALCIUM 9.2 12/16/2021   GFRAA  05/15/2008    >60        The eGFR has been calculated using the MDRD equation. This calculation has not been validated in all clinical    Speciality Comments: No specialty comments available.  Procedures:  No procedures performed Allergies: Patient has no known allergies.   Assessment / Plan:     Visit Diagnoses: Seropositive rheumatoid arthritis ([place]) - Plan: Sedimentation rate  Symptoms appear very well  controlled today plan to continue the methotrexate 20 mg p.o. weekly hydroxychloroquine 476 mg daily and folic acid 1 mg daily.  Checking sedimentation rate for disease activity monitoring.  High risk medication use -  methotrexate 20 mg PO weekly, HCQ 200 mg daily, and folic acid 1 mg daily - Plan: CBC with Differential/Platelet, COMPLETE METABOLIC PANEL WITH GFR  Checking CBC and CMP for methotrexate toxicity monitoring.  She has regular follow[facility]up with [facility] I do not have a copy of this years exam 12/2020 to review last year was normal.  However no OCT exam on file.   Orders: Orders Placed This Encounter  Procedures   Sedimentation rate   CBC with Differential/Platelet   COMPLETE METABOLIC PANEL WITH GFR   No orders of the defined types were placed in this encounter.    Follow-Up Instructions: Return in about 3 months (around 06/30/2022) for RA on MTX/HCQ f/u 66mo.   CCollier Salina MD  Note - This record has been created using [employer]  Chart creation errors have been sought, but may not always  have been located. Such creation errors do not reflect on  the standard of medical care.

## 2022-03-26 ENCOUNTER — Other Ambulatory Visit: Payer: Self-pay | Admitting: Internal Medicine

## 2022-03-26 DIAGNOSIS — M059 Rheumatoid arthritis with rheumatoid factor, unspecified: Secondary | ICD-10-CM

## 2022-03-26 NOTE — Telephone Encounter (Signed)
Next Visit: 12/16/2021  Last Visit: 12/16/2021  Labs: 12/16/2021  Eye exam: Not on file   Current Dose per office note 12/16/2021: 1 tablet (200 mg total) by mouth daily  TM:[id number] rheumatoid arthritis   Last Fill: 12/16/2021  Okay to refill Plaquenil?

## 2022-03-30 ENCOUNTER — Encounter: Payer: Self-pay | Admitting: Internal Medicine

## 2022-03-30 ENCOUNTER — Ambulatory Visit: Payer: 59 | Attending: Internal Medicine | Admitting: Internal Medicine

## 2022-03-30 VITALS — BP 113/67 | HR 65 | Resp 15 | Ht 64.0 in | Wt 215.8 lb

## 2022-03-30 DIAGNOSIS — M059 Rheumatoid arthritis with rheumatoid factor, unspecified: Secondary | ICD-10-CM

## 2022-03-30 DIAGNOSIS — Z79899 Other long term (current) drug therapy: Secondary | ICD-10-CM | POA: Diagnosis not present

## 2022-03-31 LAB — CBC WITH DIFFERENTIAL/PLATELET
Absolute Monocytes: 451 cells/uL (ref 200–950)
Basophils Absolute: 43 cells/uL (ref 0–200)
Basophils Relative: 0.7 %
Eosinophils Absolute: 354 cells/uL (ref 15–500)
Eosinophils Relative: 5.8 %
HCT: 33.2 % — ABNORMAL LOW (ref 35.0–45.0)
Hemoglobin: 10.9 g/dL — ABNORMAL LOW (ref 11.7–15.5)
Lymphs Abs: 1409 cells/uL (ref 850–3900)
MCH: 28 pg (ref 27.0–33.0)
MCHC: 32.8 g/dL (ref 32.0–36.0)
MCV: 85.3 fL (ref 80.0–100.0)
MPV: 12.1 fL (ref 7.5–12.5)
Monocytes Relative: 7.4 %
Neutro Abs: 3843 cells/uL (ref 1500–7800)
Neutrophils Relative %: 63 %
Platelets: 208 10*3/uL (ref 140–400)
RBC: 3.89 10*6/uL (ref 3.80–5.10)
RDW: 15 % (ref 11.0–15.0)
Total Lymphocyte: 23.1 %
WBC: 6.1 10*3/uL (ref 3.8–10.8)

## 2022-03-31 LAB — COMPLETE METABOLIC PANEL WITH GFR
AG Ratio: 1.4 (calc) (ref 1.0–2.5)
ALT: 19 U/L (ref 6–29)
AST: 15 U/L (ref 10–35)
Albumin: 4 g/dL (ref 3.6–5.1)
Alkaline phosphatase (APISO): 99 U/L (ref 37–153)
BUN: 13 mg/dL (ref 7–25)
CO2: 24 mmol/L (ref 20–32)
Calcium: 8.7 mg/dL (ref 8.6–10.4)
Chloride: 107 mmol/L (ref 98–110)
Creat: 0.67 mg/dL (ref 0.50–1.05)
Globulin: 2.8 g/dL (calc) (ref 1.9–3.7)
Glucose, Bld: 209 mg/dL — ABNORMAL HIGH (ref 65–99)
Potassium: 4.4 mmol/L (ref 3.5–5.3)
Sodium: 141 mmol/L (ref 135–146)
Total Bilirubin: 0.3 mg/dL (ref 0.2–1.2)
Total Protein: 6.8 g/dL (ref 6.1–8.1)
eGFR: 99 mL/min/{1.73_m2} (ref >=60)

## 2022-03-31 LAB — SEDIMENTATION RATE: Sed Rate: 6 mm/h (ref 0–30)

## 2022-03-31 NOTE — Progress Notes (Signed)
Lab results look fine for continuing current medications. Her sedimentation rate is normal, actually down to 6 from 25 so less than before.

## 2022-04-05 MED ORDER — METHOTREXATE 2.5 MG PO TABS
20.0000 mg | ORAL_TABLET | ORAL | 0 refills | Status: DC
Start: 2022-04-05 — End: 2022-06-30

## 2022-04-05 MED ORDER — HYDROXYCHLOROQUINE SULFATE 200 MG PO TABS
200.0000 mg | ORAL_TABLET | Freq: Every day | ORAL | 0 refills | Status: DC
Start: 2022-04-05 — End: 2022-06-30

## 2022-04-05 MED ORDER — FOLIC ACID 1 MG PO TABS
1.0000 mg | ORAL_TABLET | Freq: Every day | ORAL | 1 refills | Status: DC
Start: 2022-04-05 — End: 2022-06-30

## 2022-05-24 DIAGNOSIS — E1169 Type 2 diabetes mellitus with other specified complication: Secondary | ICD-10-CM | POA: Diagnosis not present

## 2022-05-24 DIAGNOSIS — E1165 Type 2 diabetes mellitus with hyperglycemia: Secondary | ICD-10-CM | POA: Diagnosis not present

## 2022-05-24 DIAGNOSIS — I10 Essential (primary) hypertension: Secondary | ICD-10-CM | POA: Diagnosis not present

## 2022-05-24 DIAGNOSIS — E7849 Other hyperlipidemia: Secondary | ICD-10-CM | POA: Diagnosis not present

## 2022-05-24 DIAGNOSIS — Z6836 Body mass index (BMI) 36.0-36.9, adult: Secondary | ICD-10-CM | POA: Diagnosis not present

## 2022-05-24 DIAGNOSIS — E669 Obesity, unspecified: Secondary | ICD-10-CM | POA: Diagnosis not present

## 2022-05-28 DIAGNOSIS — I10 Essential (primary) hypertension: Secondary | ICD-10-CM | POA: Diagnosis not present

## 2022-05-28 DIAGNOSIS — E7849 Other hyperlipidemia: Secondary | ICD-10-CM | POA: Diagnosis not present

## 2022-06-30 ENCOUNTER — Encounter: Payer: Self-pay | Admitting: Internal Medicine

## 2022-06-30 ENCOUNTER — Ambulatory Visit: Payer: 59 | Attending: Internal Medicine | Admitting: Internal Medicine

## 2022-06-30 VITALS — BP 119/67 | HR 66 | Resp 16 | Ht 64.0 in | Wt 212.0 lb

## 2022-06-30 DIAGNOSIS — E113512 Type 2 diabetes mellitus with proliferative diabetic retinopathy with macular edema, left eye: Secondary | ICD-10-CM

## 2022-06-30 DIAGNOSIS — Z79899 Other long term (current) drug therapy: Secondary | ICD-10-CM

## 2022-06-30 DIAGNOSIS — M059 Rheumatoid arthritis with rheumatoid factor, unspecified: Secondary | ICD-10-CM

## 2022-06-30 MED ORDER — FOLIC ACID 1 MG PO TABS: 1.0000 mg | ORAL_TABLET | Freq: Every day | ORAL | 1 refills | Status: DC

## 2022-06-30 MED ORDER — HYDROXYCHLOROQUINE SULFATE 200 MG PO TABS: 200.0000 mg | ORAL_TABLET | Freq: Every day | ORAL | 0 refills | Status: DC

## 2022-06-30 MED ORDER — METHOTREXATE SODIUM 2.5 MG PO TABS: 20.0000 mg | ORAL_TABLET | ORAL | 0 refills | Status: DC

## 2022-06-30 NOTE — Progress Notes (Signed)
Office Visit Note  Patient: Brooke Sawyer             Date of Birth: 01-21-1961           MRN: [id number]             PCP: Neale Burly, MD Referring: No ref. provider found Visit Date: 06/30/2022   Subjective:  Follow-up (Doing excellent)   History of Present Illness: Brooke Sawyer is a 61 y.o. female here for follow up for seropositive RA on MTX 20 mg PO weekly and hydroxychloroquine 892 mg daily and folic acid 1 mg daily.  Overall her arthritis symptoms are doing very well since her last visit.  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  Previous HPI 03/30/22 Brooke Sawyer is a 61 y.o. female here for follow up for seropositive RA on MTX 20 mg PO weekly and HCQ 200 mg daily. She feels symptoms are well controlled and no problems with medications. Discussed HCQ screening with her eye doctor at last visit apparently was not specifically addressing this before because it had not been discussed.   Previous HPI 12/16/2021 Brooke Sawyer is a 61 y.o. female here for follow up for seropositive RA on methotrexate 20 mg PO weekly and hydroxychloroquine 200 mg daily. Since our last visit she is doing well no increase in symptoms. No trouble with medications or other health events.   Previous HPI 09/15/2021 Brooke Sawyer is a 61 y.o. female here for follow up for seropositive RA on methotrexate 20 mg PO weekly and hydroxychloroquine 200 mg daily. She feels symptoms are very well controlled. No significant morning stiffness. No interval complications or infections.    Previous HPI 06/17/21 Brooke Sawyer is a 61 y.o. female here for follow up after initial visit for transfer of care for rheumatoid arthritis initially to continue treatment with methotrexate 20 mg PO weekly. Lab tests at initial visit showing ESR of 34 minimal elevation baseline CBC and CMP unremarkable. Since our last  visit she feels symptoms are doing pretty well she finished taking the steroids uneventfully. She saw optometrist told her she needs to see the ophthalmologist, says the last visit in 2020 workup was extensive and had a patient cost of $500 so has not been regularly following up with Dr. Coralyn Pear recently.   Previous HPI 06/03/21 Brooke Sawyer is a 61 y.o. female here for seropositive, erosive RA previously managed at [facility] rheumatology. She was originally diagnosed in 2019 with symptoms of bilateral finger joints pain and swelling and decreased mobility. Rheumatology workup showing positive RF, CCP, and erosive disease on xray imaging and she started treatment with methotrexate and folic acid with initial 1 week prednisone taper. She has felt overall well controlled disease on this regimen. Some ongoing swelling led to adding hydroxychloroquine to methotrexate but had a hard time getting this due to COVID related acces issues so never stayed on it. She changed insurance due to her husband retiring on medicare benefits and unable to follow up with [facility] on new insurance. She is off methotrexate treatment for several months and has significant finger joint swelling, stiffness, and pain. She gets partial relief with NSAIDs which she currently uses to treated her finger pain and swelling. She broke her 4th and 5th does from a minor trauma with losing her balance. She has never suffered a  major bone fracture. She takes daily vitamin D supplementation but reports never having bone density screening for osteoporosis.   Review of Systems  Constitutional:  Negative for fatigue.  HENT:  Negative for mouth sores and mouth dryness.   Eyes:  Negative for dryness.  Respiratory:  Negative for shortness of breath.   Cardiovascular:  Negative for chest pain and palpitations.  Gastrointestinal:  Negative for blood in stool, constipation and diarrhea.  Endocrine: Negative for increased urination.   Genitourinary:  Negative for involuntary urination.  Musculoskeletal:  Negative for joint pain, gait problem, joint pain, joint swelling, myalgias, muscle weakness, morning stiffness, muscle tenderness and myalgias.  Skin:  Negative for color change, rash, hair loss and sensitivity to sunlight.  Allergic/Immunologic: Negative for susceptible to infections.  Neurological:  Negative for dizziness and headaches.  Hematological:  Negative for swollen glands.  Psychiatric/Behavioral:  Negative for depressed mood and sleep disturbance. The patient is not nervous/anxious.     PMFS History:  Patient Active Problem List   Diagnosis Date Noted   High risk medication use 06/03/2021   Proliferative diabetic retinopathy of left eye with macular edema associated with type 2 diabetes mellitus ([place]) 05/27/2021   Type 2 diabetes mellitus with diabetic retinopathy ([place]) 05/25/2021   Seropositive rheumatoid arthritis ([place]) 04/14/2018   Atypical squamous cell changes of undetermined significance (ASCUS) on vaginal cytology 08/23/2013   Partial nontraumatic tear of rotator cuff 10/18/2012   Rotator cuff syndrome of right shoulder 10/18/2012    Past Medical History:  Diagnosis Date   Diabetes mellitus without complication (HCC)    Rheumatoid arthritis (HCC)    Rotator cuff tear     Family History  Problem Relation Age of Onset   Diabetes Mother    Heart disease Mother    Diabetes Father    COPD Sister    Hypertension Brother    Berenice Primas' disease Daughter    Colon cancer Neg Hx    Past Surgical History:  Procedure Laterality Date   COLONOSCOPY N/A 09/27/2012   Procedure: COLONOSCOPY;  Surgeon: Rogene Houston, MD;  [facility];  Service: Endoscopy;  Laterality: N/A;  1030   Dilation and Currettage     TUBAL LIGATION     Social History   Social History Narrative   Not on file   Immunization History  Administered Date(s) Administered   Influenza Inj Mdck Quad Pf 05/12/2018,  04/21/2019   Influenza,inj,Quad PF,6+ Mos 05/07/2017   Influenza-Unspecified 05/11/2021   Moderna SARS-COV2 Booster Vaccination 08/11/2021   Moderna Sars-Covid-2 Vaccination 11/08/2019, 12/04/2019     Objective: Vital Signs: BP 119/67 (BP Location: Left Arm, Patient Position: Sitting, Cuff Size: Normal)   Pulse 66   Resp 16   Ht _0  (1.626 m)   Wt 212 lb (96.2 kg)   LMP 05/25/2016   BMI 36.39 kg/m    Physical Exam Constitutional:      Appearance: She is obese.  Cardiovascular:     Rate and Rhythm: Normal rate and regular rhythm.  Pulmonary:     Effort: Pulmonary effort is normal.     Breath sounds: Normal breath sounds.  Musculoskeletal:     Right lower leg: No edema.     Left lower leg: No edema.  Skin:    General: Skin is warm and dry.     Findings: No rash.  Neurological:     Mental Status: She is alert.  Psychiatric:        Mood and Affect: Mood  normal.      Musculoskeletal Exam:  Shoulders full ROM no tenderness or swelling Elbows full ROM no tenderness or swelling Wrists full ROM no tenderness or swelling Fingers full ROM no tenderness or swelling chronic 3rd PIP joint thickening with reduced extension Knees full ROM no tenderness or swelling bilateral crepitus present Ankles full ROM no tenderness or swelling   CDAI Exam: CDAI Score: 0  Patient Global: 0 mm; Provider Global: 0 mm Swollen: 0 ; Tender: 0  Joint Exam 06/30/2022   All documented joints were normal     Investigation: No additional findings.  Imaging: No results found.  Recent Labs: Lab Results  Component Value Date   WBC 6.1 03/30/2022   HGB 10.9 (L) 03/30/2022   PLT 208 03/30/2022   NA 141 03/30/2022   K 4.4 03/30/2022   CL 107 03/30/2022   CO2 24 03/30/2022   GLUCOSE 209 (H) 03/30/2022   BUN 13 03/30/2022   CREATININE 0.67 03/30/2022   BILITOT 0.3 03/30/2022   AST 15 03/30/2022   ALT 19 03/30/2022   PROT 6.8 03/30/2022   CALCIUM 8.7 03/30/2022   GFRAA  05/15/2008     >60        The eGFR has been calculated using the MDRD equation. This calculation has not been validated in all clinical    Speciality Comments: No specialty comments available.  Procedures:  No procedures performed Allergies: Patient has no known allergies.   Assessment / Plan:     Visit Diagnoses: Seropositive rheumatoid arthritis ([place]) - Plan: Sedimentation rate, hydroxychloroquine (PLAQUENIL) 200 MG tablet, methotrexate (RHEUMATREX) 2.5 MG tablet, folic acid (FOLVITE) 1 MG tablet  Seropositive RA with mild chronic deformity change in her finger disease appears in remission at this time.  Rechecking sedimentation rate for disease activity monitoring. Will plan to continue hydroxychloroquine 200 mg daily methotrexate 20 mg p.o. weekly and folic acid 1 mg daily.  She is scheduled for bone densitometry next month along with her mammogram she has never had previous bone density testing.  Currently takes vitamin D 1000 units daily supplementation.  High risk medication use - Plan: CBC with Differential/Platelet, COMPLETE METABOLIC PANEL WITH GFR  Checking CBC and CMP for methotrexate toxicity monitoring.  She has not had any interval health events or complications.  Proliferative diabetic retinopathy of left eye with macular edema associated with type 2 diabetes mellitus ([place])  Follows up annually with my eye doctor including retinal exam reports most recent was this summer I do not have record on file for review.  Did not report any complications or suggestion of problems for continue hydroxychloroquine at that time.  Will contact office to request copy.  Orders: Orders Placed This Encounter  Procedures   Sedimentation rate   CBC with Differential/Platelet   COMPLETE METABOLIC PANEL WITH GFR   Meds ordered this encounter  Medications   hydroxychloroquine (PLAQUENIL) 200 MG tablet    Sig: Take 1 tablet (200 mg total) by mouth daily.    Dispense:  90 tablet    Refill:  0    methotrexate (RHEUMATREX) 2.5 MG tablet    Sig: Take 8 tablets (20 mg total) by mouth once a week. Caution:Chemotherapy. Protect from light.    Dispense:  104 tablet    Refill:  0   folic acid (FOLVITE) 1 MG tablet    Sig: Take 1 tablet (1 mg total) by mouth daily.    Dispense:  90 tablet    Refill:  1  Follow-Up Instructions: Return in about 3 months (around 09/30/2022) for RA on MTX/HCQ f/u 95mo.   CCollier Salina MD  Note - This record has been created using [employer]  Chart creation errors have been sought, but may not always  have been located. Such creation errors do not reflect on  the standard of medical care.

## 2022-07-01 LAB — COMPLETE METABOLIC PANEL WITH GFR
AG Ratio: 1.4 (calc) (ref 1.0–2.5)
ALT: 13 U/L (ref 6–29)
AST: 13 U/L (ref 10–35)
Albumin: 4 g/dL (ref 3.6–5.1)
Alkaline phosphatase (APISO): 98 U/L (ref 37–153)
BUN: 11 mg/dL (ref 7–25)
CO2: 26 mmol/L (ref 20–32)
Calcium: 8.7 mg/dL (ref 8.6–10.4)
Chloride: 105 mmol/L (ref 98–110)
Creat: 0.72 mg/dL (ref 0.50–1.05)
Globulin: 2.8 g/dL (calc) (ref 1.9–3.7)
Glucose, Bld: 200 mg/dL — ABNORMAL HIGH (ref 65–139)
Potassium: 3.8 mmol/L (ref 3.5–5.3)
Sodium: 140 mmol/L (ref 135–146)
Total Bilirubin: 0.2 mg/dL (ref 0.2–1.2)
Total Protein: 6.8 g/dL (ref 6.1–8.1)
eGFR: 94 mL/min/{1.73_m2} (ref >=60)

## 2022-07-01 LAB — CBC WITH DIFFERENTIAL/PLATELET
Absolute Monocytes: 482 cells/uL (ref 200–950)
Basophils Absolute: 43 cells/uL (ref 0–200)
Basophils Relative: 0.7 %
Eosinophils Absolute: 244 cells/uL (ref 15–500)
Eosinophils Relative: 4 %
HCT: 33.1 % — ABNORMAL LOW (ref 35.0–45.0)
Hemoglobin: 10.9 g/dL — ABNORMAL LOW (ref 11.7–15.5)
Lymphs Abs: 1513 cells/uL (ref 850–3900)
MCH: 28.2 pg (ref 27.0–33.0)
MCHC: 32.9 g/dL (ref 32.0–36.0)
MCV: 85.5 fL (ref 80.0–100.0)
MPV: 11.8 fL (ref 7.5–12.5)
Monocytes Relative: 7.9 %
Neutro Abs: 3819 cells/uL (ref 1500–7800)
Neutrophils Relative %: 62.6 %
Platelets: 197 10*3/uL (ref 140–400)
RBC: 3.87 10*6/uL (ref 3.80–5.10)
RDW: 14.6 % (ref 11.0–15.0)
Total Lymphocyte: 24.8 %
WBC: 6.1 10*3/uL (ref 3.8–10.8)

## 2022-07-01 LAB — SEDIMENTATION RATE: Sed Rate: 34 mm/h — ABNORMAL HIGH (ref 0–30)

## 2022-07-01 IMAGING — MG DIGITAL SCREENING BILAT W/ TOMO W/ CAD
6 of 10 series · 6 of 30 positions shown · non-contrast
Comparison: Previous exam(s).

CLINICAL DATA: Screening.

EXAM:
DIGITAL SCREENING BILATERAL MAMMOGRAM WITH TOMO AND CAD

[L CC synth-2D]
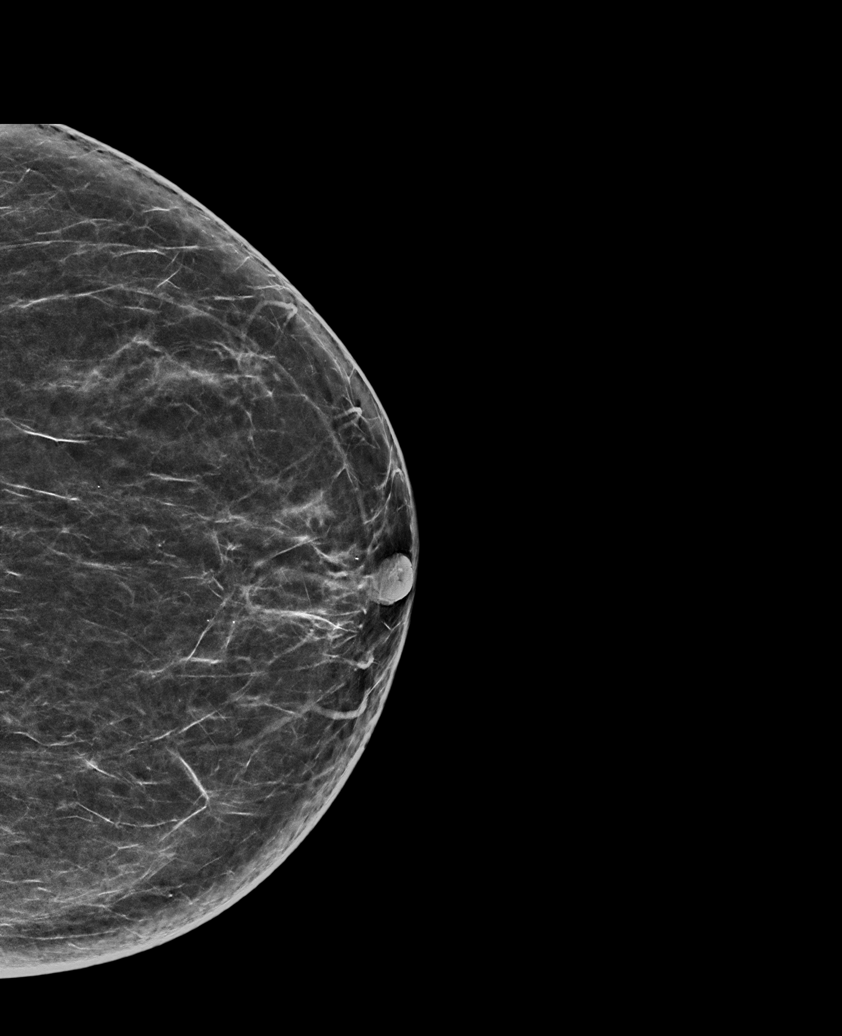

[R CC synth-2D (1 of 2)]
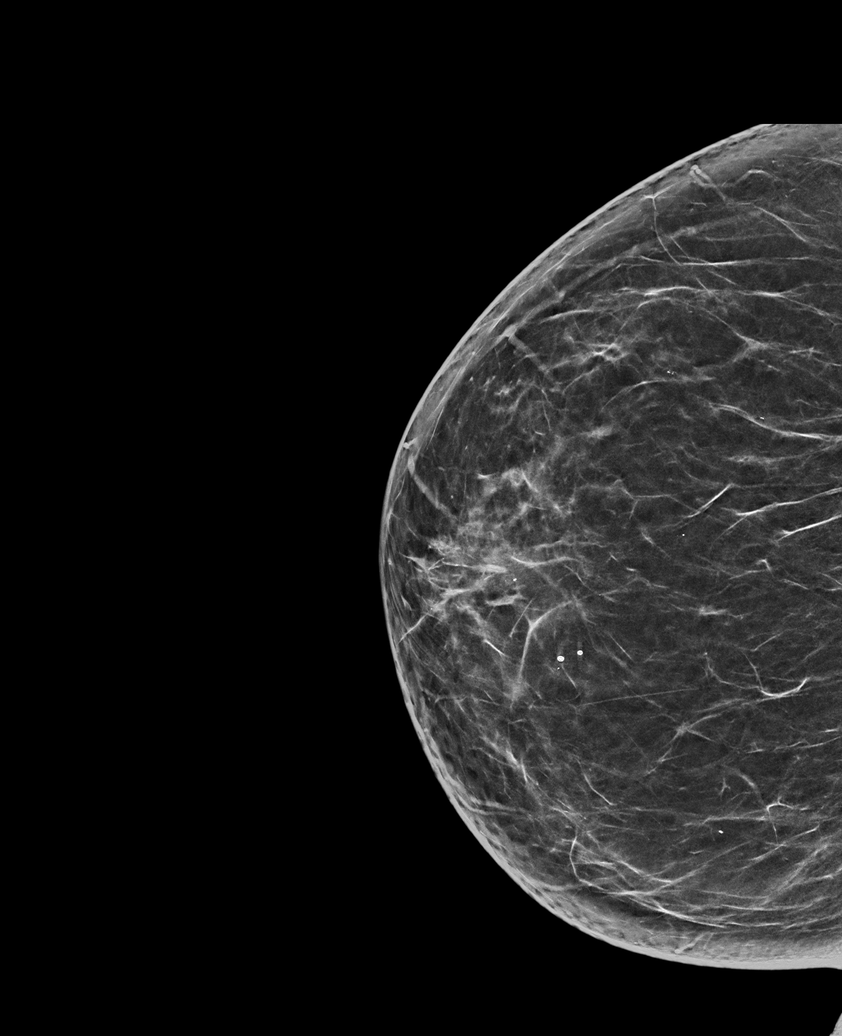

[L MLO synth-2D]
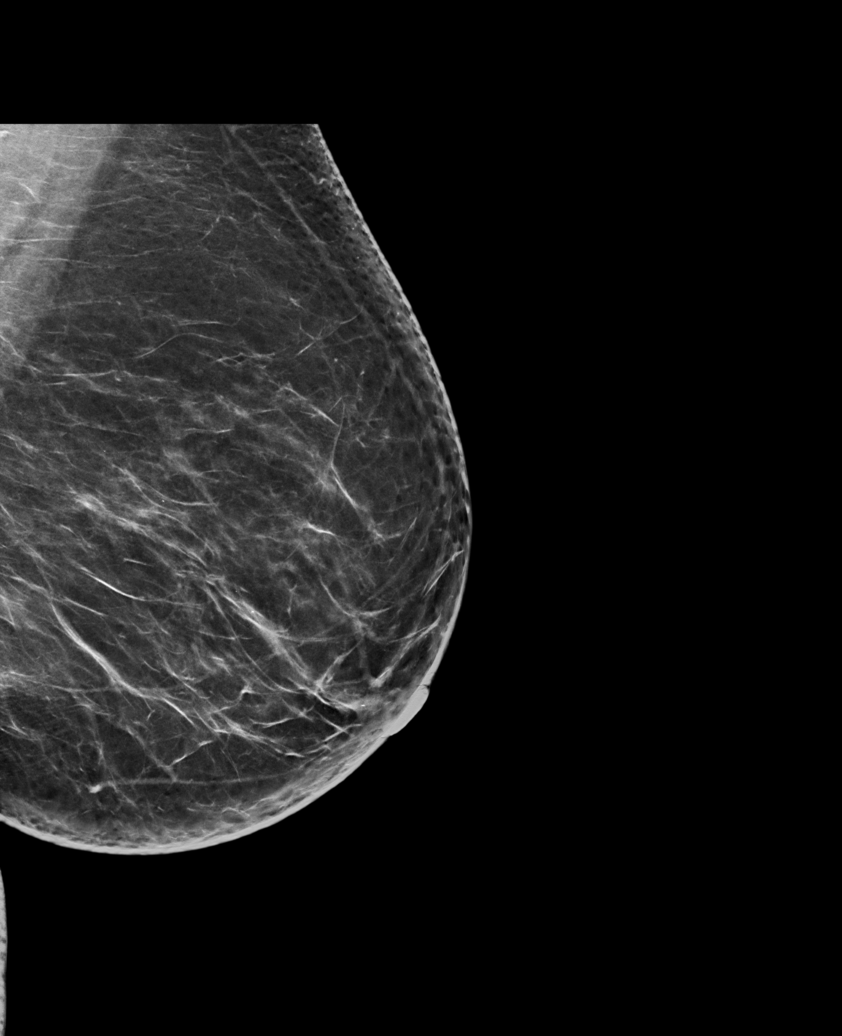

[R MLO synth-2D]
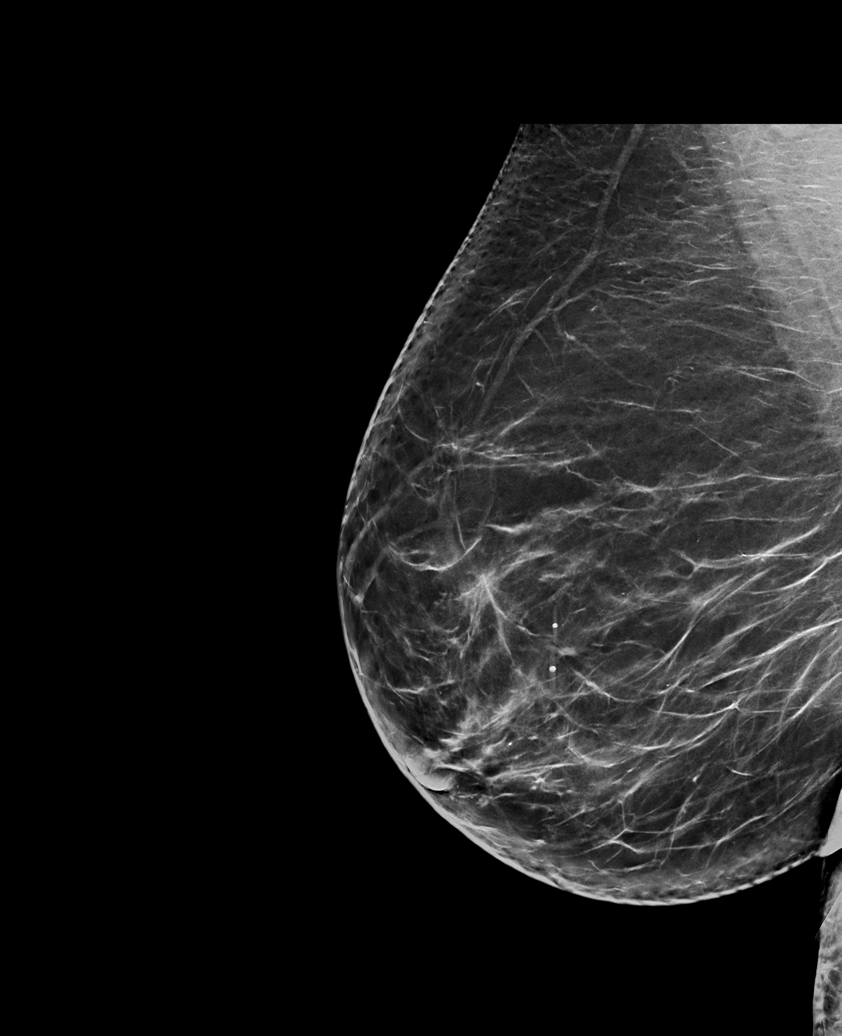

[R CC synth-2D (2 of 2)]
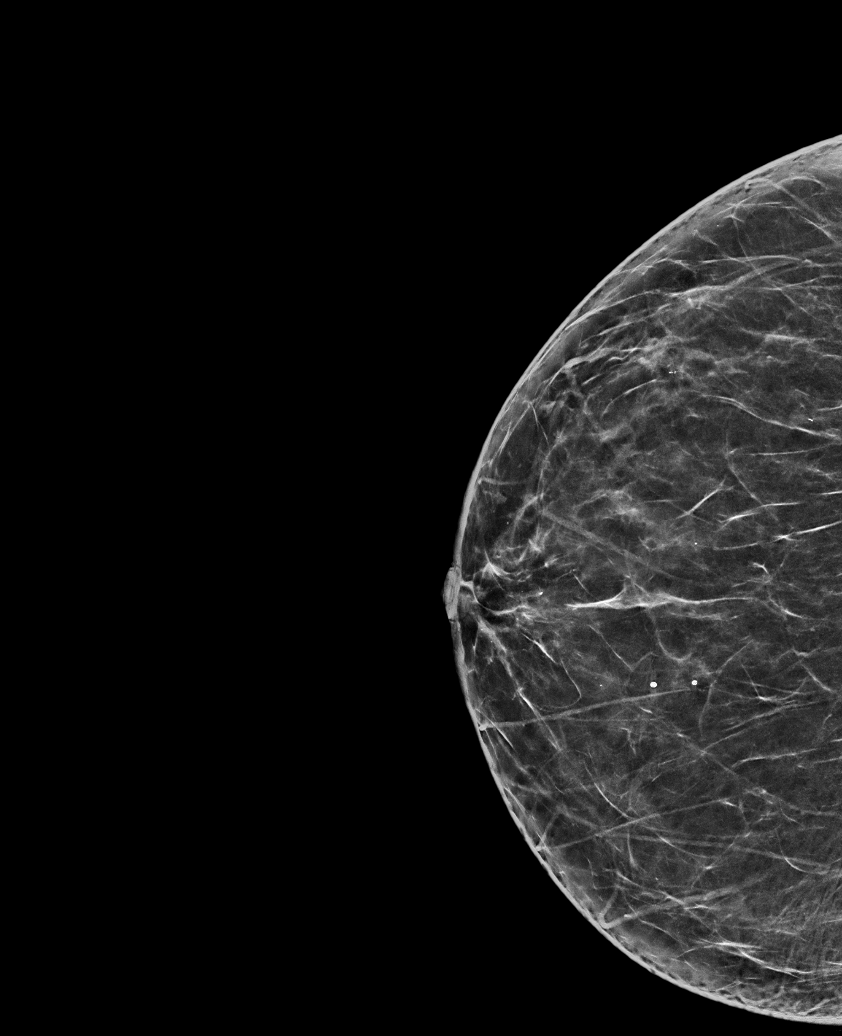

[R CC tomo · tomo slice 35/69.0]
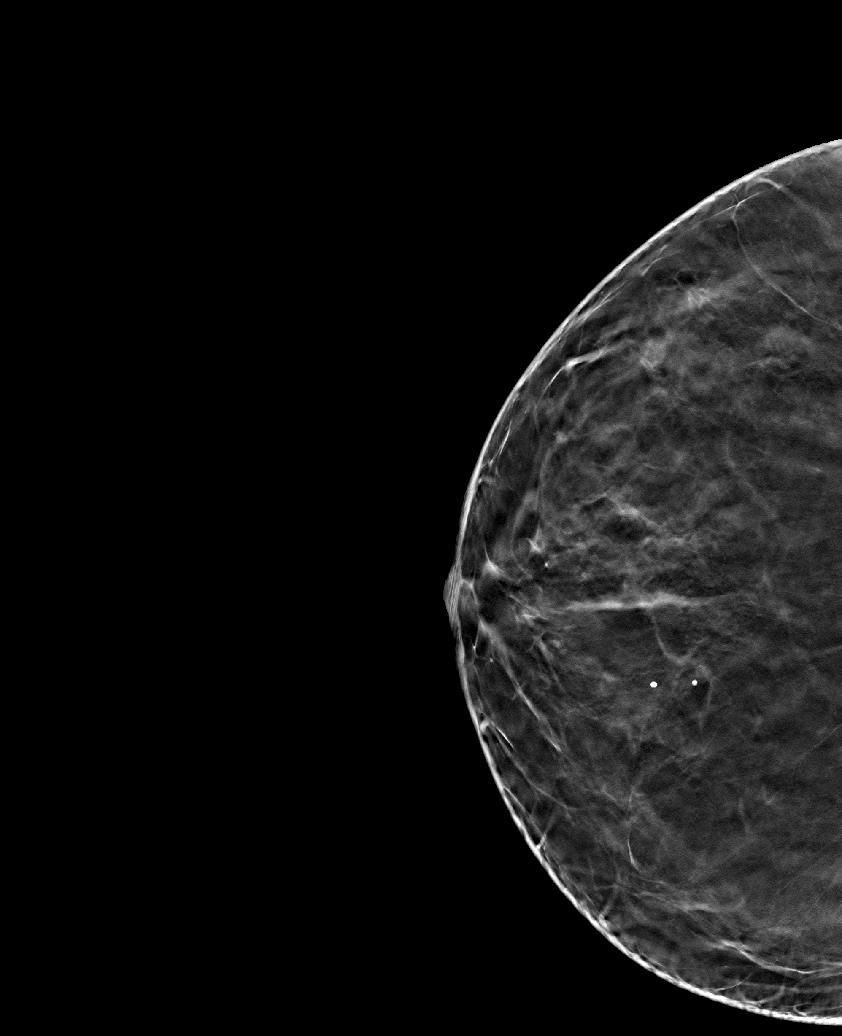

[6 of 30 positions shown; findings below may reference images not displayed]

ACR Breast Density Category b: There are scattered areas of
fibroglandular density.
FINDINGS: There are no findings suspicious for malignancy. Images were
processed with CAD.
IMPRESSION: No mammographic evidence of malignancy. A result letter of this
screening mammogram will be mailed directly to the patient.

RECOMMENDATION:
Screening mammogram in one year. (Code:[id number])

BI-RADS CATEGORY  1: Negative.

## 2022-07-02 NOTE — Progress Notes (Signed)
Lab results look fine for continuing the methotrexate and hydroxychloroquine.  Her sedimentation rate is mildly increased today up to 34 suggesting some active inflammation.  Her exam and symptoms were doing pretty well so I do not recommend a medication change at this time and we will recheck this at her next follow-up.

## 2022-07-19 ENCOUNTER — Ambulatory Visit (HOSPITAL_COMMUNITY)
Admission: RE | Admit: 2022-07-19 | Discharge: 2022-07-19 | Disposition: A | Discharge status: Home / Self Care | Payer: 59 | Source: Ambulatory Visit | Attending: Internal Medicine | Admitting: Internal Medicine

## 2022-07-19 DIAGNOSIS — M810 Age-related osteoporosis without current pathological fracture: Secondary | ICD-10-CM | POA: Diagnosis not present

## 2022-07-19 DIAGNOSIS — M85851 Other specified disorders of bone density and structure, right thigh: Secondary | ICD-10-CM | POA: Diagnosis not present

## 2022-07-19 DIAGNOSIS — Z1231 Encounter for screening mammogram for malignant neoplasm of breast: Secondary | ICD-10-CM | POA: Insufficient documentation

## 2022-07-19 DIAGNOSIS — Z780 Asymptomatic menopausal state: Secondary | ICD-10-CM | POA: Diagnosis not present

## 2022-07-19 DIAGNOSIS — Z13820 Encounter for screening for osteoporosis: Secondary | ICD-10-CM | POA: Insufficient documentation

## 2022-07-21 ENCOUNTER — Other Ambulatory Visit: Payer: Self-pay | Admitting: Internal Medicine

## 2022-07-21 DIAGNOSIS — M059 Rheumatoid arthritis with rheumatoid factor, unspecified: Secondary | ICD-10-CM

## 2022-08-30 DIAGNOSIS — E1169 Type 2 diabetes mellitus with other specified complication: Secondary | ICD-10-CM | POA: Diagnosis not present

## 2022-08-30 DIAGNOSIS — E669 Obesity, unspecified: Secondary | ICD-10-CM | POA: Diagnosis not present

## 2022-08-30 DIAGNOSIS — E7849 Other hyperlipidemia: Secondary | ICD-10-CM | POA: Diagnosis not present

## 2022-08-30 DIAGNOSIS — Z6836 Body mass index (BMI) 36.0-36.9, adult: Secondary | ICD-10-CM | POA: Diagnosis not present

## 2022-08-30 DIAGNOSIS — I10 Essential (primary) hypertension: Secondary | ICD-10-CM | POA: Diagnosis not present

## 2022-09-06 ENCOUNTER — Other Ambulatory Visit: Payer: Self-pay | Admitting: Family Medicine

## 2022-09-12 ENCOUNTER — Other Ambulatory Visit: Payer: Self-pay | Admitting: Internal Medicine

## 2022-09-12 DIAGNOSIS — M059 Rheumatoid arthritis with rheumatoid factor, unspecified: Secondary | ICD-10-CM

## 2022-09-13 NOTE — Telephone Encounter (Signed)
Next Visit: 09/28/2022  Last Visit: 06/30/2022  Last Fill: 06/30/2022  DX: Seropositive rheumatoid arthritis   Current Dose per office note 06/30/2022: methotrexate 20 mg p.o. weekly   Labs: 06/30/2022 Lab results look fine for continuing the methotrexate and hydroxychloroquine.   Okay to refill MTX?

## 2022-09-22 ENCOUNTER — Encounter (INDEPENDENT_AMBULATORY_CARE_PROVIDER_SITE_OTHER): Payer: Self-pay | Admitting: *Deleted

## 2022-09-27 NOTE — Progress Notes (Deleted)
Office Visit Note  Patient: Brooke Sawyer             Date of Birth: Sep 28, 1960           MRN: [id number]             PCP: Neale Burly, MD Referring: Neale Burly, MD Visit Date: 09/28/2022   Subjective:  No chief complaint on file.   History of Present Illness: Brooke Sawyer is a 62 y.o. female here for follow up ***   Previous HPI 06/30/22 Brooke Sawyer is a 63 y.o. female here for follow up for seropositive RA on MTX 20 mg PO weekly and hydroxychloroquine [id number] mg daily and folic acid 1 mg daily.  Overall her arthritis symptoms are doing very well since her last visit.  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   Previous HPI 03/30/22 Brooke Sawyer is a 62 y.o. female here for follow up for seropositive RA on MTX 20 mg PO weekly and HCQ 200 mg daily. She feels symptoms are well controlled and no problems with medications. Discussed HCQ screening with her eye doctor at last visit apparently was not specifically addressing this before because it had not been discussed.   Previous HPI 12/16/2021 Brooke Sawyer is a 63 y.o. female here for follow up for seropositive RA on methotrexate 20 mg PO weekly and hydroxychloroquine 200 mg daily. Since our last visit she is doing well no increase in symptoms. No trouble with medications or other health events.   Previous HPI 09/15/2021 Brooke Sawyer is a 62 y.o. female here for follow up for seropositive RA on methotrexate 20 mg PO weekly and hydroxychloroquine 200 mg daily. She feels symptoms are very well controlled. No significant morning stiffness. No interval complications or infections.    Previous HPI 06/17/21 Brooke Sawyer is a 62 y.o. female here for follow up after initial visit for transfer of care for rheumatoid arthritis initially to continue treatment with methotrexate 20 mg PO weekly. Lab tests at initial visit  showing ESR of 34 minimal elevation baseline CBC and CMP unremarkable. Since our last visit she feels symptoms are doing pretty well she finished taking the steroids uneventfully. She saw optometrist told her she needs to see the ophthalmologist, says the last visit in 2020 workup was extensive and had a patient cost of $500 so has not been regularly following up with Dr. Coralyn Pear recently.   Previous HPI 06/03/21 Brooke Sawyer is a 62 y.o. female here for seropositive, erosive RA previously managed at [facility] clinic rheumatology. She was originally diagnosed in 2019 with symptoms of bilateral finger joints pain and swelling and decreased mobility. Rheumatology workup showing positive RF, CCP, and erosive disease on xray imaging and she started treatment with methotrexate and folic acid with initial 1 week prednisone taper. She has felt overall well controlled disease on this regimen. Some ongoing swelling led to adding hydroxychloroquine to methotrexate but had a hard time getting this due to COVID related acces issues so never stayed on it. She changed insurance due to her husband retiring on medicare benefits and unable to follow up with [facility] on new insurance. She is off methotrexate treatment for several months and has significant finger joint swelling, stiffness, and pain. She gets partial relief with NSAIDs which she currently uses to treated her finger pain and  swelling. She broke her 4th and 5th does from a minor trauma with losing her balance. She has never suffered a major bone fracture. She takes daily vitamin D supplementation but reports never having bone density screening for osteoporosis.   No Rheumatology ROS completed.   PMFS History:  Patient Active Problem List   Diagnosis Date Noted   High risk medication use 06/03/2021   Proliferative diabetic retinopathy of left eye with macular edema associated with type 2 diabetes mellitus ([place]) 05/27/2021   Type 2 diabetes mellitus  with diabetic retinopathy ([place]) 05/25/2021   Seropositive rheumatoid arthritis ([place]) 04/14/2018   Atypical squamous cell changes of undetermined significance (ASCUS) on vaginal cytology 08/23/2013   Partial nontraumatic tear of rotator cuff 10/18/2012   Rotator cuff syndrome of right shoulder 10/18/2012    Past Medical History:  Diagnosis Date   Diabetes mellitus without complication (HCC)    Rheumatoid arthritis (HCC)    Rotator cuff tear     Family History  Problem Relation Age of Onset   Diabetes Mother    Heart disease Mother    Diabetes Father    COPD Sister    Hypertension Brother    Berenice Primas' disease Daughter    Colon cancer Neg Hx    Past Surgical History:  Procedure Laterality Date   COLONOSCOPY N/A 09/27/2012   Procedure: COLONOSCOPY;  Surgeon: Rogene Houston, MD;  [facility];  Service: Endoscopy;  Laterality: N/A;  1030   Dilation and Currettage     TUBAL LIGATION     Social History   Social History Narrative   Not on file   Immunization History  Administered Date(s) Administered   Influenza Inj Mdck Quad Pf 05/12/2018, 04/21/2019   Influenza,inj,Quad PF,6+ Mos 05/07/2017   Influenza-Unspecified 05/11/2021   Moderna SARS-COV2 Booster Vaccination 08/11/2021   Moderna Sars-Covid-2 Vaccination 11/08/2019, 12/04/2019     Objective: Vital Signs: LMP 05/25/2016    Physical Exam   Musculoskeletal Exam: ***  CDAI Exam: CDAI Score: -- Patient Global: --; Provider Global: -- Swollen: --; Tender: -- Joint Exam 09/28/2022   No joint exam has been documented for this visit   There is currently no information documented on the homunculus. Go to the Rheumatology activity and complete the homunculus joint exam.  Investigation: No additional findings.  Imaging: No results found.  Recent Labs: Lab Results  Component Value Date   WBC 6.1 06/30/2022   HGB 10.9 (L) 06/30/2022   PLT 197 06/30/2022   NA 140 06/30/2022   K 3.8 06/30/2022   CL  105 06/30/2022   CO2 26 06/30/2022   GLUCOSE 200 (H) 06/30/2022   BUN 11 06/30/2022   CREATININE 0.72 06/30/2022   BILITOT 0.2 06/30/2022   AST 13 06/30/2022   ALT 13 06/30/2022   PROT 6.8 06/30/2022   CALCIUM 8.7 06/30/2022   GFRAA  05/15/2008    >60        The eGFR has been calculated using the MDRD equation. This calculation has not been validated in all clinical    Speciality Comments: No specialty comments available.  Procedures:  No procedures performed Allergies: Patient has no known allergies.   Assessment / Plan:     Visit Diagnoses: No diagnosis found.  ***  Orders: No orders of the defined types were placed in this encounter.  No orders of the defined types were placed in this encounter.    Follow-Up Instructions: No follow-ups on file.   Collier Salina, MD  Note - This record has been created using Dragon software.  Chart creation errors have been sought, but may not always  have been located. Such creation errors do not reflect on  the standard of medical care.  

## 2022-09-28 ENCOUNTER — Ambulatory Visit: Payer: 59 | Admitting: Internal Medicine

## 2022-09-30 ENCOUNTER — Encounter: Payer: Self-pay | Admitting: Radiology

## 2022-10-13 ENCOUNTER — Telehealth (INDEPENDENT_AMBULATORY_CARE_PROVIDER_SITE_OTHER): Payer: Self-pay | Admitting: Gastroenterology

## 2022-10-13 NOTE — Telephone Encounter (Signed)
Who is your primary care physician: Dr.Hasanaj; [facility]   Reasons for the colonoscopy: 10 year recall  Have you had a colonoscopy before?  Yes  Do you have family history of colon cancer? No  Previous colonoscopy with polyps removed? Yes;dont know (maybe 8 yrs ago) 2 or 3 little polyps  Do you have a history colorectal cancer?   No  Are you diabetic? If yes, Type 1 or Type 2?   Yes  Do you have a prosthetic or mechanical heart valve? No  Do you have a pacemaker/defibrillator?   No  Have you had endocarditis/atrial fibrillation? No  Have you had joint replacement within the last 12 months?  No  Do you tend to be constipated or have to use laxatives? no  Do you have any history of drugs or alchohol?  no  Do you use supplemental oxygen?  no  Have you had a stroke or heart attack within the last 6 months? no  Do you take weight loss medication?  no  For female patients: have you had a hysterectomy?  no                                     are you post menopausal?       no                                            do you still have your menstrual cycle? no      Do you take any blood-thinning medications such as: (aspirin, warfarin, Plavix, Aggrenox)  no  If yes we need the name, milligram, dosage and who is prescribing doctor  Current Outpatient Medications on File Prior to Visit  Medication Sig Dispense Refill   atorvastatin (LIPITOR) 20 MG tablet TAKE 1 TABLET BY MOUTH EVERYDAY AT BEDTIME 90 tablet 1   cholecalciferol (VITAMIN D) 1000 UNITS tablet Take 1,000 Units by mouth daily.     Cyanocobalamin (B-12 PO) Take 1 tablet by mouth daily.     folic acid (FOLVITE) 1 MG tablet Take 1 tablet (1 mg total) by mouth daily. 90 tablet 1   glipiZIDE (GLUCOTROL) 10 MG tablet Take 10 mg by mouth 2 (two) times daily.     hydroxychloroquine (PLAQUENIL) 200 MG tablet Take 1 tablet (200 mg total) by mouth daily. 90 tablet 0   IBUPROFEN PO Take by mouth as  needed.     JARDIANCE 25 MG TABS tablet Take 25 mg by mouth every morning.     losartan (COZAAR) 25 MG tablet Take 25 mg by mouth daily.     metFORMIN (GLUCOPHAGE) 500 MG tablet TAKE 2 TABLETS BY MOUTH TWICE A DAY WITH MORNING AND EVENING MEALS 360 tablet 3   methotrexate (RHEUMATREX) 2.5 MG tablet TAKE 8 TABLETS (20 MG TOTAL) BY MOUTH ONCE A WEEK. CAUTION:CHEMOTHERAPY. PROTECT FROM LIGHT. 96 tablet 0   Omega-3 Fatty Acids (FISH OIL OMEGA-3 PO) Take by mouth.     No current facility-administered medications on file prior to visit.    No Known Allergies   Pharmacy: [facility]   Primary Insurance Name: [place] number where you can be reached: [phone]

## 2022-10-13 NOTE — Telephone Encounter (Signed)
Any room, hold Jardiance per protocol Thanks

## 2022-10-18 NOTE — Telephone Encounter (Signed)
Contacted pt to schedule TCS. Pt unavailable on all days that are room 1-2. Pt does not want to have to do a telephone pre op; room 3 days did not work for patient. Will call pt back when May schedule comes out. Pt verbalized understanding.

## 2022-10-19 NOTE — Progress Notes (Unsigned)
Office Visit Note  Patient: Brooke Sawyer             Date of Birth: September 25, 1960           MRN: [id number]             PCP: Neale Burly, MD Referring: Neale Burly, MD Visit Date: 10/20/2022   Subjective:  No chief complaint on file.   History of Present Illness: Brooke Sawyer is a 62 y.o. female here for follow up ***   Previous HPI 06/30/22  Brooke Sawyer is a 62 y.o. female here for follow up for seropositive RA on MTX 20 mg PO weekly and hydroxychloroquine [id number] mg daily and folic acid 1 mg daily.  Overall her arthritis symptoms are doing very well since her last visit.  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   Previous HPI 03/30/22 Brooke Sawyer is a 62 y.o. female here for follow up for seropositive RA on MTX 20 mg PO weekly and HCQ 200 mg daily. She feels symptoms are well controlled and no problems with medications. Discussed HCQ screening with her eye doctor at last visit apparently was not specifically addressing this before because it had not been discussed.   Previous HPI 12/16/2021 Brooke Sawyer is a 62 y.o. female here for follow up for seropositive RA on methotrexate 20 mg PO weekly and hydroxychloroquine 200 mg daily. Since our last visit she is doing well no increase in symptoms. No trouble with medications or other health events.   Previous HPI 09/15/2021 Brooke Sawyer is a 62 y.o. female here for follow up for seropositive RA on methotrexate 20 mg PO weekly and hydroxychloroquine 200 mg daily. She feels symptoms are very well controlled. No significant morning stiffness. No interval complications or infections.    Previous HPI 06/17/21 Brooke Sawyer is a 62 y.o. female here for follow up after initial visit for transfer of care for rheumatoid arthritis initially to continue treatment with methotrexate 20 mg PO weekly. Lab tests at initial visit  showing ESR of 34 minimal elevation baseline CBC and CMP unremarkable. Since our last visit she feels symptoms are doing pretty well she finished taking the steroids uneventfully. She saw optometrist told her she needs to see the ophthalmologist, says the last visit in 2020 workup was extensive and had a patient cost of $500 so has not been regularly following up with Dr. Coralyn Pear recently.   Previous HPI 06/03/21 Brooke Sawyer is a 62 y.o. female here for seropositive, erosive RA previously managed at [facility] rheumatology. She was originally diagnosed in 2019 with symptoms of bilateral finger joints pain and swelling and decreased mobility. Rheumatology workup showing positive RF, CCP, and erosive disease on xray imaging and she started treatment with methotrexate and folic acid with initial 1 week prednisone taper. She has felt overall well controlled disease on this regimen. Some ongoing swelling led to adding hydroxychloroquine to methotrexate but had a hard time getting this due to COVID related acces issues so never stayed on it. She changed insurance due to her husband retiring on medicare benefits and unable to follow up with [facility] on new insurance. She is off methotrexate treatment for several months and has significant finger joint swelling, stiffness, and pain. She gets partial relief with NSAIDs which she currently uses to treated her finger pain  and swelling. She broke her 4th and 5th does from a minor trauma with losing her balance. She has never suffered a major bone fracture. She takes daily vitamin D supplementation but reports never having bone density screening for osteoporosis.     No Rheumatology ROS completed.   PMFS History:  Patient Active Problem List   Diagnosis Date Noted   High risk medication use 06/03/2021   Proliferative diabetic retinopathy of left eye with macular edema associated with type 2 diabetes mellitus ([place]) 05/27/2021   Type 2 diabetes mellitus  with diabetic retinopathy ([place]) 05/25/2021   Seropositive rheumatoid arthritis ([place]) 04/14/2018   Atypical squamous cell changes of undetermined significance (ASCUS) on vaginal cytology 08/23/2013   Partial nontraumatic tear of rotator cuff 10/18/2012   Rotator cuff syndrome of right shoulder 10/18/2012    Past Medical History:  Diagnosis Date   Diabetes mellitus without complication (HCC)    Rheumatoid arthritis (HCC)    Rotator cuff tear     Family History  Problem Relation Age of Onset   Diabetes Mother    Heart disease Mother    Diabetes Father    COPD Sister    Hypertension Brother    Berenice Primas' disease Daughter    Colon cancer Neg Hx    Past Surgical History:  Procedure Laterality Date   COLONOSCOPY N/A 09/27/2012   Procedure: COLONOSCOPY;  Surgeon: Rogene Houston, MD;  [facility];  Service: Endoscopy;  Laterality: N/A;  1030   Dilation and Currettage     TUBAL LIGATION     Social History   Social History Narrative   Not on file   Immunization History  Administered Date(s) Administered   Influenza Inj Mdck Quad Pf 05/12/2018, 04/21/2019   Influenza,inj,Quad PF,6+ Mos 05/07/2017   Influenza-Unspecified 05/11/2021   Moderna SARS-COV2 Booster Vaccination 08/11/2021   Moderna Sars-Covid-2 Vaccination 11/08/2019, 12/04/2019     Objective: Vital Signs: LMP 05/25/2016    Physical Exam   Musculoskeletal Exam: ***  CDAI Exam: CDAI Score: -- Patient Global: --; Provider Global: -- Swollen: --; Tender: -- Joint Exam 10/20/2022   No joint exam has been documented for this visit   There is currently no information documented on the homunculus. Go to the Rheumatology activity and complete the homunculus joint exam.  Investigation: No additional findings.  Imaging: No results found.  Recent Labs: Lab Results  Component Value Date   WBC 6.1 06/30/2022   HGB 10.9 (L) 06/30/2022   PLT 197 06/30/2022   NA 140 06/30/2022   K 3.8 06/30/2022   CL  105 06/30/2022   CO2 26 06/30/2022   GLUCOSE 200 (H) 06/30/2022   BUN 11 06/30/2022   CREATININE 0.72 06/30/2022   BILITOT 0.2 06/30/2022   AST 13 06/30/2022   ALT 13 06/30/2022   PROT 6.8 06/30/2022   CALCIUM 8.7 06/30/2022   GFRAA  05/15/2008    >60        The eGFR has been calculated using the MDRD equation. This calculation has not been validated in all clinical    Speciality Comments: No specialty comments available.  Procedures:  No procedures performed Allergies: Patient has no known allergies.   Assessment / Plan:     Visit Diagnoses: No diagnosis found.  ***  Orders: No orders of the defined types were placed in this encounter.  No orders of the defined types were placed in this encounter.    Follow-Up Instructions: No follow-ups on file.   Resa Miner  Marzelle Rutten, MD  Note - This record has been created using Editor, commissioning.  Chart creation errors have been sought, but may not always  have been located. Such creation errors do not reflect on  the standard of medical care.

## 2022-10-20 ENCOUNTER — Ambulatory Visit: Payer: 59 | Attending: Internal Medicine | Admitting: Internal Medicine

## 2022-10-20 ENCOUNTER — Encounter: Payer: Self-pay | Admitting: Internal Medicine

## 2022-10-20 VITALS — BP 110/73 | HR 74 | Resp 12 | Ht 64.0 in | Wt 213.0 lb

## 2022-10-20 DIAGNOSIS — M059 Rheumatoid arthritis with rheumatoid factor, unspecified: Secondary | ICD-10-CM

## 2022-10-20 DIAGNOSIS — Z79899 Other long term (current) drug therapy: Secondary | ICD-10-CM

## 2022-10-21 LAB — COMPLETE METABOLIC PANEL WITH GFR
AG Ratio: 1.5 (calc) (ref 1.0–2.5)
ALT: 16 U/L (ref 6–29)
AST: 16 U/L (ref 10–35)
Albumin: 4.3 g/dL (ref 3.6–5.1)
Alkaline phosphatase (APISO): 95 U/L (ref 37–153)
BUN: 15 mg/dL (ref 7–25)
CO2: 26 mmol/L (ref 20–32)
Calcium: 9.2 mg/dL (ref 8.6–10.4)
Chloride: 104 mmol/L (ref 98–110)
Creat: 0.73 mg/dL (ref 0.50–1.05)
Globulin: 2.8 g/dL (calc) (ref 1.9–3.7)
Glucose, Bld: 172 mg/dL — ABNORMAL HIGH (ref 65–99)
Potassium: 4.5 mmol/L (ref 3.5–5.3)
Sodium: 141 mmol/L (ref 135–146)
Total Bilirubin: 0.3 mg/dL (ref 0.2–1.2)
Total Protein: 7.1 g/dL (ref 6.1–8.1)
eGFR: 92 mL/min/{1.73_m2} (ref >=60)

## 2022-10-21 LAB — CBC WITH DIFFERENTIAL/PLATELET
Absolute Monocytes: 541 cells/uL (ref 200–950)
Basophils Absolute: 79 cells/uL (ref 0–200)
Basophils Relative: 1.2 %
Eosinophils Absolute: 310 cells/uL (ref 15–500)
Eosinophils Relative: 4.7 %
HCT: 34.3 % — ABNORMAL LOW (ref 35.0–45.0)
Hemoglobin: 11.5 g/dL — ABNORMAL LOW (ref 11.7–15.5)
Lymphs Abs: 2066 cells/uL (ref 850–3900)
MCH: 28 pg (ref 27.0–33.0)
MCHC: 33.5 g/dL (ref 32.0–36.0)
MCV: 83.7 fL (ref 80.0–100.0)
MPV: 11.9 fL (ref 7.5–12.5)
Monocytes Relative: 8.2 %
Neutro Abs: 3604 cells/uL (ref 1500–7800)
Neutrophils Relative %: 54.6 %
Platelets: 252 10*3/uL (ref 140–400)
RBC: 4.1 10*6/uL (ref 3.80–5.10)
RDW: 15 % (ref 11.0–15.0)
Total Lymphocyte: 31.3 %
WBC: 6.6 10*3/uL (ref 3.8–10.8)

## 2022-10-21 LAB — SEDIMENTATION RATE: Sed Rate: 34 mm/h — ABNORMAL HIGH (ref 0–30)

## 2022-10-21 NOTE — Progress Notes (Signed)
Sedimentation rate is 34 same as it was 3 months ago.  Blood count shows a small improvement in her hemoglobin 11.5 this is almost normal.  Metabolic panel is good blood sugar is 172 but is actually better than her previous blood sugars from few months ago.  No problems continuing current methotrexate and hydroxychloroquine.

## 2022-10-26 ENCOUNTER — Other Ambulatory Visit: Payer: Self-pay | Admitting: Family Medicine

## 2022-11-09 ENCOUNTER — Other Ambulatory Visit: Payer: Self-pay | Admitting: Internal Medicine

## 2022-11-09 DIAGNOSIS — M059 Rheumatoid arthritis with rheumatoid factor, unspecified: Secondary | ICD-10-CM

## 2022-11-10 NOTE — Telephone Encounter (Signed)
Last Fill: Plaquenil 06/30/2022, Methotrexate 09/13/2022  Eye exam: Not of file   Labs: 10/20/2022 Sedimentation rate is 34 same as it was 3 months ago.  Blood count shows a small improvement in her hemoglobin 11.5 this is almost normal.  Metabolic panel is good blood sugar is 172 but is actually better than her previous blood sugars from few months ago.  No problems continuing current methotrexate and hydroxychloroquine.   Next Visit: 01/17/2023  Last Visit: 10/20/2022  MV:[id number] rheumatoid arthritis   Current Dose per office note 10/20/2022: methotrexate 20 mg p.o. weekly, hydroxychloroquine 200 mg daily.   Contacted the patient to advise PLQ eye exam is due. Patient states she will pick up a PLQ eye exam form and has an eye appointment in July.   Okay to refill Plaquenil and Methotrexate?

## 2022-11-22 MED ORDER — PEG 3350-KCL-NA BICARB-NACL 420 G PO SOLR: 4000.0000 mL | Freq: Once | ORAL | 0 refills | Status: AC

## 2022-11-22 NOTE — Telephone Encounter (Signed)
Pt returned call multiple times but I was unable to get to phone due to scheduling pts in office.  Called pt back and scheduled her colonoscopy for 12/01/22. Instructions printed and placed up front so pt could come by to pick them up. Prep sent to pharmacy.

## 2022-11-22 NOTE — Addendum Note (Signed)
Addended by: Marlowe Shores on: 11/22/2022 12:08 PM   Modules accepted: Orders

## 2022-11-22 NOTE — Telephone Encounter (Signed)
Left message to return call 

## 2022-12-01 ENCOUNTER — Encounter (INDEPENDENT_AMBULATORY_CARE_PROVIDER_SITE_OTHER): Payer: Self-pay | Admitting: *Deleted

## 2022-12-01 ENCOUNTER — Encounter (HOSPITAL_COMMUNITY)
Admission: RE | Disposition: A | Discharge status: Home / Self Care | Payer: Self-pay | Source: Ambulatory Visit | Attending: Gastroenterology

## 2022-12-01 ENCOUNTER — Ambulatory Visit (HOSPITAL_COMMUNITY): Payer: 59 | Admitting: Anesthesiology

## 2022-12-01 ENCOUNTER — Encounter (HOSPITAL_COMMUNITY): Payer: Self-pay | Admitting: Gastroenterology

## 2022-12-01 ENCOUNTER — Ambulatory Visit (HOSPITAL_BASED_OUTPATIENT_CLINIC_OR_DEPARTMENT_OTHER): Payer: 59 | Admitting: Anesthesiology

## 2022-12-01 ENCOUNTER — Other Ambulatory Visit: Payer: Self-pay

## 2022-12-01 ENCOUNTER — Ambulatory Visit (HOSPITAL_COMMUNITY)
Admission: RE | Admit: 2022-12-01 | Discharge: 2022-12-01 | Disposition: A | Discharge status: Home / Self Care | Payer: 59 | Source: Ambulatory Visit | Attending: Gastroenterology | Admitting: Gastroenterology

## 2022-12-01 DIAGNOSIS — Z87891 Personal history of nicotine dependence: Secondary | ICD-10-CM | POA: Insufficient documentation

## 2022-12-01 DIAGNOSIS — Z7984 Long term (current) use of oral hypoglycemic drugs: Secondary | ICD-10-CM | POA: Insufficient documentation

## 2022-12-01 DIAGNOSIS — K5140 Inflammatory polyps of colon without complications: Secondary | ICD-10-CM

## 2022-12-01 DIAGNOSIS — Z86010 Personal history of colonic polyps: Secondary | ICD-10-CM | POA: Diagnosis not present

## 2022-12-01 DIAGNOSIS — E119 Type 2 diabetes mellitus without complications: Secondary | ICD-10-CM

## 2022-12-01 DIAGNOSIS — K635 Polyp of colon: Secondary | ICD-10-CM | POA: Diagnosis not present

## 2022-12-01 DIAGNOSIS — D122 Benign neoplasm of ascending colon: Secondary | ICD-10-CM | POA: Diagnosis not present

## 2022-12-01 DIAGNOSIS — M069 Rheumatoid arthritis, unspecified: Secondary | ICD-10-CM | POA: Diagnosis not present

## 2022-12-01 DIAGNOSIS — Z1211 Encounter for screening for malignant neoplasm of colon: Secondary | ICD-10-CM | POA: Diagnosis not present

## 2022-12-01 HISTORY — PX: POLYPECTOMY: SHX5525

## 2022-12-01 HISTORY — PX: COLONOSCOPY WITH PROPOFOL: SHX5780

## 2022-12-01 LAB — GLUCOSE, CAPILLARY: Glucose-Capillary: 182 mg/dL — ABNORMAL HIGH (ref 70–99)

## 2022-12-01 LAB — HM COLONOSCOPY

## 2022-12-01 SURGERY — COLONOSCOPY WITH PROPOFOL
Anesthesia: General

## 2022-12-01 MED ORDER — LACTATED RINGERS IV SOLN: INTRAVENOUS | Status: DC

## 2022-12-01 MED ORDER — LIDOCAINE HCL (CARDIAC) PF 100 MG/5ML IV SOSY
PREFILLED_SYRINGE | INTRAVENOUS | Status: DC | PRN
  Administered 2022-12-01: 50 mg via INTRATRACHEAL

## 2022-12-01 MED ORDER — PROPOFOL 10 MG/ML IV BOLUS
INTRAVENOUS | Status: DC | PRN
  Administered 2022-12-01: 100 mg via INTRAVENOUS

## 2022-12-01 MED ORDER — LACTATED RINGERS IV SOLN
INTRAVENOUS | Status: DC
  Administered 2022-12-01: 1000 mL via INTRAVENOUS

## 2022-12-01 MED ORDER — STERILE WATER FOR IRRIGATION IR SOLN
Status: DC | PRN
  Administered 2022-12-01: .6 mL

## 2022-12-01 MED ORDER — PROPOFOL 500 MG/50ML IV EMUL
INTRAVENOUS | Status: DC | PRN
  Administered 2022-12-01: 200 ug/kg/min via INTRAVENOUS

## 2022-12-01 NOTE — Anesthesia Preprocedure Evaluation (Signed)
Anesthesia Evaluation  Patient identified by MRN, date of birth, ID band Patient awake    Reviewed: Allergy & Precautions, H&P , NPO status , Patient's Chart, lab work & pertinent test results, reviewed documented beta blocker date and time   Airway Mallampati: II  TM Distance: >3 FB Neck ROM: full    Dental no notable dental hx.    Pulmonary neg pulmonary ROS, former smoker   Pulmonary exam normal breath sounds clear to auscultation       Cardiovascular Exercise Tolerance: Good negative cardio ROS  Rhythm:regular Rate:Normal     Neuro/Psych negative neurological ROS  negative psych ROS   GI/Hepatic negative GI ROS, Neg liver ROS,,,  Endo/Other  negative endocrine ROSdiabetes, Type 2    Renal/GU negative Renal ROS  negative genitourinary   Musculoskeletal   Abdominal   Peds  Hematology negative hematology ROS (+)   Anesthesia Other Findings   Reproductive/Obstetrics negative OB ROS                             Anesthesia Physical Anesthesia Plan  ASA: 2  Anesthesia Plan: General   Post-op Pain Management:    Induction:   PONV Risk Score and Plan: Propofol infusion  Airway Management Planned:   Additional Equipment:   Intra-op Plan:   Post-operative Plan:   Informed Consent: I have reviewed the patients History and Physical, chart, labs and discussed the procedure including the risks, benefits and alternatives for the proposed anesthesia with the patient or authorized representative who has indicated his/her understanding and acceptance.     Dental Advisory Given  Plan Discussed with: CRNA  Anesthesia Plan Comments:        Anesthesia Quick Evaluation  

## 2022-12-01 NOTE — Discharge Instructions (Addendum)
You are being discharged to home.  Resume your previous diet.  We are waiting for your pathology results.  Your physician has recommended a repeat colonoscopy for surveillance based on pathology results.  

## 2022-12-01 NOTE — Op Note (Signed)
[facility] Patient Name: Brooke Sawyer Procedure Date: 12/01/2022 11:16 AM MRN: [id number] Date of Birth: [birth year] Attending MD: Katrinka Blazing , , [id number] CSN: [id number] Age: 62 Admit Type: Outpatient Procedure:                Colonoscopy Indications:              Surveillance: Personal history of adenomatous                            polyps on last colonoscopy > 5 years ago Providers:                Katrinka Blazing, Edrick Kins, RN, Dyann Ruddle Referring MD:              Medicines:                Monitored Anesthesia Care Complications:            No immediate complications. Estimated Blood Loss:     Estimated blood loss: none. Procedure:                Pre-Anesthesia Assessment:                           - Prior to the procedure, a History and Physical                            was performed, and patient medications, allergies                            and sensitivities were reviewed. The patient's                            tolerance of previous anesthesia was reviewed.                           - The risks and benefits of the procedure and the                            sedation options and risks were discussed with the                            patient. All questions were answered and informed                            consent was obtained.                           - ASA Grade Assessment: II - A patient with mild                            systemic disease.                           After obtaining informed consent, the colonoscope                            was passed under  direct vision. Throughout the                            procedure, the patient's blood pressure, pulse, and                            oxygen saturations were monitored continuously. The                            [id number]-[id number] ([id number]) scope was introduced through                            the anus and advanced to the the cecum, identified                            by appendiceal  orifice and ileocecal valve. The                            colonoscopy was performed without difficulty. The                            patient tolerated the procedure well. The quality                            of the bowel preparation was excellent. Scope In: 11:31:51 AM Scope Out: 11:57:28 AM Scope Withdrawal Time: 0 hours 13 minutes 21 seconds  Total Procedure Duration: 0 hours 25 minutes 37 seconds  Findings:      The perianal and digital rectal examinations were normal.      A 3 mm polyp was found in the proximal ascending colon. The polyp was       sessile. The polyp was removed with a cold snare. Resection and       retrieval were complete.      The retroflexed view of the distal rectum and anal verge was normal and       showed no anal or rectal abnormalities. Impression:               - One 3 mm polyp in the proximal ascending colon,                            removed with a cold snare. Resected and retrieved.                           - The distal rectum and anal verge are normal on                            retroflexion view. Moderate Sedation:      Per Anesthesia Care Recommendation:           - Discharge patient to home (ambulatory).                           - Resume previous diet.                           -  Await pathology results.                           - Repeat colonoscopy for surveillance based on                            pathology results. Procedure Code(s):        --- Professional ---                           [phone], Colonoscopy, flexible; with removal of                            tumor(s), polyp(s), or other lesion(s) by snare                            technique Diagnosis Code(s):        --- Professional ---                           Z86.010, Personal history of colonic polyps                           D12.2, Benign neoplasm of ascending colon CPT copyright 2022 American Medical Association. All rights reserved. The codes documented in this report  are preliminary and upon coder review may  be revised to meet current compliance requirements. Katrinka Blazing, MD Katrinka Blazing,  12/01/2022 12:03:26 PM This report has been signed electronically. Number of Addenda: 0

## 2022-12-01 NOTE — H&P (Signed)
Brooke Sawyer is an 62 y.o. female.   Chief Complaint: History of colon polyps HPI: 62 year old female with medical history of diabetes, rheumatoid arthritis, coming for history of colon polyps.  Last colonoscopy was performed in 2014, had 1 tubular adenoma removed.  The patient denies having any complaints such as melena, hematochezia, abdominal pain or distention, change in her bowel movement consistency or frequency, no changes in weight recently.  No family history of colorectal cancer.   Past Medical History:  Diagnosis Date   Diabetes mellitus without complication (HCC)    Rheumatoid arthritis (HCC)    Rotator cuff tear     Past Surgical History:  Procedure Laterality Date   COLONOSCOPY N/A 09/27/2012   Procedure: COLONOSCOPY;  Surgeon: Malissa Hippo, MD;  Location[facility];  Service: Endoscopy;  Laterality: N/A;  1030   Dilation and Currettage     TUBAL LIGATION      Family History  Problem Relation Age of Onset   Diabetes Mother    Heart disease Mother    Diabetes Father    COPD Sister    Hypertension Brother    Luiz Blare' disease Daughter    Colon cancer Neg Hx    Social History:  reports that she quit smoking about 32 years ago. Her smoking use included cigarettes. She has a 3.00 pack-year smoking history. She has never been exposed to tobacco smoke. She has never used smokeless tobacco. She reports that she does not drink alcohol and does not use drugs.  Allergies: No Known Allergies  Medications Prior to Admission  Medication Sig Dispense Refill   atorvastatin (LIPITOR) 20 MG tablet TAKE 1 TABLET BY MOUTH EVERYDAY AT BEDTIME 90 tablet 1   Cholecalciferol (VITAMIN D) 50 MCG (2000 UT) tablet Take 2,000 Units by mouth daily.     Cyanocobalamin (B-12 PO) Take 1,000 mcg by mouth daily.     folic acid (FOLVITE) 1 MG tablet Take 1 tablet (1 mg total) by mouth daily. 90 tablet 1   glipiZIDE (GLUCOTROL) 10 MG tablet Take 10 mg by mouth 2 (two) times daily.      hydroxychloroquine (PLAQUENIL) 200 MG tablet TAKE 1 TABLET BY MOUTH EVERY DAY 90 tablet 0   JARDIANCE 25 MG TABS tablet Take 25 mg by mouth every morning.     levocetirizine (XYZAL) 5 MG tablet Take 5 mg by mouth daily as needed for allergies.     losartan (COZAAR) 25 MG tablet Take 25 mg by mouth daily.     metFORMIN (GLUCOPHAGE) 500 MG tablet TAKE 2 TABLETS BY MOUTH TWICE A DAY WITH MORNING AND EVENING MEALS (Patient taking differently: Take 500-1,000 mg by mouth See admin instructions. Take 1000 mg in the morning and 500 mg in the evening) 360 tablet 3   methotrexate (RHEUMATREX) 2.5 MG tablet TAKE 8 TABLETS (20 MG TOTAL) BY MOUTH ONCE A WEEK. CAUTION:CHEMOTHERAPY. PROTECT FROM LIGHT. (Patient taking differently: Take 20 mg by mouth every Friday. Caution:Chemotherapy. Protect from light.) 96 tablet 0   Omega-3 Fatty Acids (FISH OIL OMEGA-3) 1000 MG CAPS Take 1,000 mg by mouth daily.      No results found for this or any previous visit (from the past 48 hour(s)). No results found.  Review of Systems  All other systems reviewed and are negative.   Blood pressure 123/74, pulse 71, temperature 98.2 F (36.8 C), temperature source Oral, resp. rate 15, height 5\' 4"  (1.626 m), weight 96.6 kg, last menstrual period 05/25/2016, SpO2 99 %.  Physical Exam  GENERAL: The patient is AO x3, in no acute distress. HEENT: Head is normocephalic and atraumatic. EOMI are intact. Mouth is well hydrated and without lesions. NECK: Supple. No masses LUNGS: Clear to auscultation. No presence of rhonchi/wheezing/rales. Adequate chest expansion HEART: RRR, normal s1 and s2. ABDOMEN: Soft, nontender, no guarding, no peritoneal signs, and nondistended. BS +. No masses. EXTREMITIES: Without any cyanosis, clubbing, rash, lesions or edema. NEUROLOGIC: AOx3, no focal motor deficit. SKIN: no jaundice, no rashes  Assessment/Plan 62 year old female with medical history of diabetes, rheumatoid arthritis, coming for  history of colon polyps.  Will proceed with colonoscopy.  Dolores Frame, MD 12/01/2022, 10:42 AM

## 2022-12-01 NOTE — Transfer of Care (Signed)
Immediate Anesthesia Transfer of Care Note  Patient: Brooke Sawyer  Procedure(s) Performed: COLONOSCOPY WITH PROPOFOL POLYPECTOMY  Patient Location: Endoscopy Unit  Anesthesia Type:General  Level of Consciousness: awake  Airway & Oxygen Therapy: Patient Spontanous Breathing  Post-op Assessment: Report given to RN and Post -op Vital signs reviewed and stable  Post vital signs: Reviewed and stable  Last Vitals:  Vitals Value Taken Time  BP    Temp    Pulse    Resp    SpO2      Last Pain:  Vitals:   12/01/22 1128  TempSrc:   PainSc: 0-No pain      Patients Stated Pain Goal: 9 (12/01/22 1006)  Complications: No notable events documented.

## 2022-12-02 DIAGNOSIS — E1169 Type 2 diabetes mellitus with other specified complication: Secondary | ICD-10-CM | POA: Diagnosis not present

## 2022-12-02 DIAGNOSIS — I10 Essential (primary) hypertension: Secondary | ICD-10-CM | POA: Diagnosis not present

## 2022-12-02 DIAGNOSIS — E7849 Other hyperlipidemia: Secondary | ICD-10-CM | POA: Diagnosis not present

## 2022-12-02 DIAGNOSIS — E1121 Type 2 diabetes mellitus with diabetic nephropathy: Secondary | ICD-10-CM | POA: Diagnosis not present

## 2022-12-02 DIAGNOSIS — E669 Obesity, unspecified: Secondary | ICD-10-CM | POA: Diagnosis not present

## 2022-12-02 DIAGNOSIS — Z6837 Body mass index (BMI) 37.0-37.9, adult: Secondary | ICD-10-CM | POA: Diagnosis not present

## 2022-12-02 LAB — SURGICAL PATHOLOGY

## 2022-12-03 NOTE — Anesthesia Postprocedure Evaluation (Signed)
Anesthesia Post Note  Patient: Brooke Sawyer  Procedure(s) Performed: COLONOSCOPY WITH PROPOFOL POLYPECTOMY  Patient location during evaluation: Phase II Anesthesia Type: General Level of consciousness: awake Pain management: pain level controlled Vital Signs Assessment: post-procedure vital signs reviewed and stable Respiratory status: spontaneous breathing and respiratory function stable Cardiovascular status: blood pressure returned to baseline and stable Postop Assessment: no headache and no apparent nausea or vomiting Anesthetic complications: no Comments: Late entry   No notable events documented.   Last Vitals:  Vitals:   12/01/22 1006 12/01/22 1201  BP: 123/74 (!) 99/44  Pulse: 71 82  Resp: 15 (!) 25  Temp: 36.8 C (!) 36.4 C  SpO2: 99% 96%    Last Pain:  Vitals:   12/01/22 1201  TempSrc: Oral  PainSc: 0-No pain                 Windell Norfolk

## 2022-12-08 ENCOUNTER — Encounter (HOSPITAL_COMMUNITY): Payer: Self-pay | Admitting: Gastroenterology

## 2023-01-02 ENCOUNTER — Other Ambulatory Visit: Payer: Self-pay | Admitting: Internal Medicine

## 2023-01-02 DIAGNOSIS — M059 Rheumatoid arthritis with rheumatoid factor, unspecified: Secondary | ICD-10-CM

## 2023-01-17 ENCOUNTER — Ambulatory Visit: Payer: 59 | Admitting: Internal Medicine

## 2023-01-26 ENCOUNTER — Ambulatory Visit: Payer: 59 | Admitting: Internal Medicine

## 2023-01-26 NOTE — Progress Notes (Deleted)
Office Visit Note  Patient: Brooke Sawyer             Date of Birth: 22-Jan-1961           MRN: [id number]             PCP: Toma Deiters, MD Referring: Toma Deiters, MD Visit Date: 01/26/2023   Subjective:  No chief complaint on file.   History of Present Illness: Brooke Sawyer is a 62 y.o. female here for follow up ***   Previous HPI 10/20/22 Brooke Sawyer is a 62 y.o. female here for follow up for seropositive RA on methotrexate 20 mg p.o. weekly and hydroxychloroquine 200 mg daily folic acid 1 mg daily.  Overall arthritis symptoms are doing well since her last visit no new flareups.  Not having prolonged morning stiffness or significant joint pain during winter weather this year.  She has not had any significant infections since her last visit.  Blood work at previous visit did show mild elevation in sedimentation rate but no particular signs of disease activity.    Previous HPI 06/30/22  Brooke Sawyer is a 62 y.o. female here for follow up for seropositive RA on MTX 20 mg PO weekly and hydroxychloroquine 200 mg daily and folic acid 1 mg daily.  Overall her arthritis symptoms are doing very well since her last visit.  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   Previous HPI 03/30/22 Brooke Sawyer is a 62 y.o. female here for follow up for seropositive RA on MTX 20 mg PO weekly and HCQ 200 mg daily. She feels symptoms are well controlled and no problems with medications. Discussed HCQ screening with her eye doctor at last visit apparently was not specifically addressing this before because it had not been discussed.   Previous HPI 12/16/2021 Brooke Sawyer is a 62 y.o. female here for follow up for seropositive RA on methotrexate 20 mg PO weekly and hydroxychloroquine 200 mg daily. Since our last visit she is doing well no increase in symptoms. No trouble with medications  or other health events.   Previous HPI 09/15/2021 Brooke Sawyer is a 61 y.o. female here for follow up for seropositive RA on methotrexate 20 mg PO weekly and hydroxychloroquine 200 mg daily. She feels symptoms are very well controlled. No significant morning stiffness. No interval complications or infections.    Previous HPI 06/17/21 Brooke Sawyer is a 62 y.o. female here for follow up after initial visit for transfer of care for rheumatoid arthritis initially to continue treatment with methotrexate 20 mg PO weekly. Lab tests at initial visit showing ESR of 34 minimal elevation baseline CBC and CMP unremarkable. Since our last visit she feels symptoms are doing pretty well she finished taking the steroids uneventfully. She saw optometrist told her she needs to see the ophthalmologist, says the last visit in 2020 workup was extensive and had a patient cost of $500 so has not been regularly following up with Dr. Vanessa Barbara recently.   Previous HPI 06/03/21 Brooke Sawyer is a 62 y.o. female here for seropositive, erosive RA previously managed at [facility] rheumatology. She was originally diagnosed in 2019 with symptoms of bilateral finger joints pain and swelling and decreased mobility. Rheumatology workup showing positive RF, CCP, and erosive disease on xray imaging and she started treatment with methotrexate and folic  acid with initial 1 week prednisone taper. She has felt overall well controlled disease on this regimen. Some ongoing swelling led to adding hydroxychloroquine to methotrexate but had a hard time getting this due to COVID related acces issues so never stayed on it. She changed insurance due to her husband retiring on medicare benefits and unable to follow up with [facility] on new insurance. She is off methotrexate treatment for several months and has significant finger joint swelling, stiffness, and pain. She gets partial relief with NSAIDs which she currently uses to treated her finger  pain and swelling. She broke her 4th and 5th does from a minor trauma with losing her balance. She has never suffered a major bone fracture. She takes daily vitamin D supplementation but reports never having bone density screening for osteoporosis.   No Rheumatology ROS completed.   PMFS History:  Patient Active Problem List   Diagnosis Date Noted   History of colonic polyps 12/01/2022   High risk medication use 06/03/2021   Proliferative diabetic retinopathy of left eye with macular edema associated with type 2 diabetes mellitus (HCC) 05/27/2021   Type 2 diabetes mellitus with diabetic retinopathy (HCC) 05/25/2021   Seropositive rheumatoid arthritis (HCC) 04/14/2018   Atypical squamous cell changes of undetermined significance (ASCUS) on vaginal cytology 08/23/2013   Partial nontraumatic tear of rotator cuff 10/18/2012   Rotator cuff syndrome of right shoulder 10/18/2012    Past Medical History:  Diagnosis Date   Diabetes mellitus without complication (HCC)    Rheumatoid arthritis (HCC)    Rotator cuff tear     Family History  Problem Relation Age of Onset   Diabetes Mother    Heart disease Mother    Diabetes Father    COPD Sister    Hypertension Brother    Luiz Blare' disease Daughter    Colon cancer Neg Hx    Past Surgical History:  Procedure Laterality Date   COLONOSCOPY N/A 09/27/2012   Procedure: COLONOSCOPY;  Surgeon: Malissa Hippo, MD;  [facility];  Service: Endoscopy;  Laterality: N/A;  1030   COLONOSCOPY WITH PROPOFOL N/A 12/01/2022   Procedure: COLONOSCOPY WITH PROPOFOL;  Surgeon: Dolores Frame, MD;  Location: [facility];  Service: Gastroenterology;  Laterality: N/A;  11:45AM;ASA 1-2   Dilation and Currettage     POLYPECTOMY  12/01/2022   Procedure: POLYPECTOMY;  Surgeon: Dolores Frame, MD;  Location: [facility];  Service: Gastroenterology;;   TUBAL LIGATION     Social History   Social History Narrative   Not on file    Immunization History  Administered Date(s) Administered   Influenza Inj Mdck Quad Pf 05/12/2018, 04/21/2019   Influenza,inj,Quad PF,6+ Mos 05/07/2017   Influenza-Unspecified 05/11/2021   Moderna SARS-COV2 Booster Vaccination 08/11/2021   Moderna Sars-Covid-2 Vaccination 11/08/2019, 12/04/2019     Objective: Vital Signs: LMP 05/25/2016    Physical Exam   Musculoskeletal Exam: ***  CDAI Exam: CDAI Score: -- Patient Global: --; Provider Global: -- Swollen: --; Tender: -- Joint Exam 01/26/2023   No joint exam has been documented for this visit   There is currently no information documented on the homunculus. Go to the Rheumatology activity and complete the homunculus joint exam.  Investigation: No additional findings.  Imaging: No results found.  Recent Labs: Lab Results  Component Value Date   WBC 6.6 10/20/2022   HGB 11.5 (L) 10/20/2022   PLT 252 10/20/2022   NA 141 10/20/2022   K 4.5 10/20/2022  CL 104 10/20/2022   CO2 26 10/20/2022   GLUCOSE 172 (H) 10/20/2022   BUN 15 10/20/2022   CREATININE 0.73 10/20/2022   BILITOT 0.3 10/20/2022   AST 16 10/20/2022   ALT 16 10/20/2022   PROT 7.1 10/20/2022   CALCIUM 9.2 10/20/2022   GFRAA  05/15/2008    >60        The eGFR has been calculated using the MDRD equation. This calculation has not been validated in all clinical    Speciality Comments: No specialty comments available.  Procedures:  No procedures performed Allergies: Patient has no known allergies.   Assessment / Plan:     Visit Diagnoses: No diagnosis found.  ***  Orders: No orders of the defined types were placed in this encounter.  No orders of the defined types were placed in this encounter.    Follow-Up Instructions: No follow-ups on file.   Fuller Plan, MD  Note - This record has been created using [employer].  Chart creation errors have been sought, but may not always  have been located. Such creation errors do not  reflect on  the standard of medical care.

## 2023-02-15 DIAGNOSIS — E113593 Type 2 diabetes mellitus with proliferative diabetic retinopathy without macular edema, bilateral: Secondary | ICD-10-CM | POA: Diagnosis not present

## 2023-02-25 ENCOUNTER — Other Ambulatory Visit: Payer: Self-pay | Admitting: Internal Medicine

## 2023-02-25 DIAGNOSIS — M059 Rheumatoid arthritis with rheumatoid factor, unspecified: Secondary | ICD-10-CM

## 2023-02-26 ENCOUNTER — Other Ambulatory Visit: Payer: Self-pay | Admitting: Internal Medicine

## 2023-02-26 DIAGNOSIS — M059 Rheumatoid arthritis with rheumatoid factor, unspecified: Secondary | ICD-10-CM

## 2023-02-28 NOTE — Telephone Encounter (Signed)
Last Fill: 11/11/2022  Labs: 10/20/2022 Sedimentation rate is 34 same as it was 3 months ago.  Blood count shows a small improvement in her hemoglobin 11.5 this is almost normal.  Metabolic panel is good blood sugar is 172 but is actually better than her previous blood sugars from few months ago.  No problems continuing current methotrexate and hydroxychloroquine.   Next Visit: 03/10/2023  Last Visit: 10/20/2022  DX: Seropositive rheumatoid arthritis   Current Dose per office note 10/20/2022: methotrexate 20 mg p.o. weekly  Patient to update labs at upcoming appointment on 03/10/2023  Okay to refill Methotrexate?

## 2023-02-28 NOTE — Telephone Encounter (Signed)
Last Fill: 06/30/2022  Next Visit: 8//03/2023  Last Visit: 10/20/2022  Dx: Seropositive rheumatoid arthritis   Current Dose per office note on 10/20/2022: folic acid 1 mg daily   Okay to refill Folic Acid?

## 2023-03-01 ENCOUNTER — Encounter (INDEPENDENT_AMBULATORY_CARE_PROVIDER_SITE_OTHER): Payer: 59 | Admitting: Ophthalmology

## 2023-03-03 NOTE — Progress Notes (Addendum)
Office Visit Note  Patient: Brooke Sawyer             Date of Birth: 1961-05-19           MRN: [id number]             PCP: Toma Deiters, MD Referring: Toma Deiters, MD Visit Date: 03/10/2023   Subjective:  Follow-up   History of Present Illness: Brooke Sawyer is a 62 y.o. female here for follow up for seropositive RA on methotrexate 20 mg p.o. weekly and hydroxychloroquine 200 mg daily folic acid 1 mg daily.  She is doing well overall no significant disease flare but not noticing any visible joint swelling.  Does not have any prolonged morning stiffness.  She is working on keeping regular physical activity weight appears stable compared to visit earlier this year.  She missed her original appointment about 2 weeks ago so rescheduled to today.  Had ophthalmology exam looked fine for continuing Plaquenil.  Previous HPI 10/20/2022  Brooke Sawyer is a 62 y.o. female here for follow up for seropositive RA on methotrexate 20 mg p.o. weekly and hydroxychloroquine 200 mg daily folic acid 1 mg daily.  Overall arthritis symptoms are doing well since her last visit no new flareups.  Not having prolonged morning stiffness or significant joint pain during winter weather this year.  She has not had any significant infections since her last visit.  Blood work at previous visit did show mild elevation in sedimentation rate but no particular signs of disease activity.    Previous HPI 06/30/22  Brooke Sawyer is a 62 y.o. female here for follow up for seropositive RA on MTX 20 mg PO weekly and hydroxychloroquine 200 mg daily and folic acid 1 mg daily.  Overall her arthritis symptoms are doing very well since her last visit.  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   Previous HPI 03/30/22 Brooke Sawyer is a 62 y.o. female here for follow up for seropositive RA on MTX 20 mg PO weekly and HCQ  200 mg daily. She feels symptoms are well controlled and no problems with medications. Discussed HCQ screening with her eye doctor at last visit apparently was not specifically addressing this before because it had not been discussed.   Previous HPI 12/16/2021 Brooke Sawyer is a 62 y.o. female here for follow up for seropositive RA on methotrexate 20 mg PO weekly and hydroxychloroquine 200 mg daily. Since our last visit she is doing well no increase in symptoms. No trouble with medications or other health events.   Previous HPI 09/15/2021 Brooke Sawyer is a 62 y.o. female here for follow up for seropositive RA on methotrexate 20 mg PO weekly and hydroxychloroquine 200 mg daily. She feels symptoms are very well controlled. No significant morning stiffness. No interval complications or infections.    Previous HPI 06/17/21 Brooke Sawyer is a 62 y.o. female here for follow up after initial visit for transfer of care for rheumatoid arthritis initially to continue treatment with methotrexate 20 mg PO weekly. Lab tests at initial visit showing ESR of 34 minimal elevation baseline CBC and CMP unremarkable. Since our last visit she feels symptoms are doing pretty well she finished taking the steroids uneventfully. She saw optometrist told her she needs to see the ophthalmologist, says the last visit in 2020 workup was extensive  and had a patient cost of $500 so has not been regularly following up with Dr. Vanessa Barbara recently.   Previous HPI 06/03/21 Brooke Sawyer is a 62 y.o. female here for seropositive, erosive RA previously managed at [facility] clinic rheumatology. She was originally diagnosed in 2019 with symptoms of bilateral finger joints pain and swelling and decreased mobility. Rheumatology workup showing positive RF, CCP, and erosive disease on xray imaging and she started treatment with methotrexate and folic acid with initial 1 week prednisone taper. She has felt overall well controlled disease on this  regimen. Some ongoing swelling led to adding hydroxychloroquine to methotrexate but had a hard time getting this due to COVID related acces issues so never stayed on it. She changed insurance due to her husband retiring on medicare benefits and unable to follow up with [facility] on new insurance. She is off methotrexate treatment for several months and has significant finger joint swelling, stiffness, and pain. She gets partial relief with NSAIDs which she currently uses to treated her finger pain and swelling. She broke her 4th and 5th does from a minor trauma with losing her balance. She has never suffered a major bone fracture. She takes daily vitamin D supplementation but reports never having bone density screening for osteoporosis.   Review of Systems  Constitutional:  Negative for fatigue.  HENT:  Negative for mouth sores and mouth dryness.   Eyes:  Negative for dryness.  Respiratory:  Negative for shortness of breath.   Cardiovascular:  Negative for chest pain and palpitations.  Gastrointestinal:  Negative for blood in stool, constipation and diarrhea.  Endocrine: Negative for increased urination.  Genitourinary:  Negative for involuntary urination.  Musculoskeletal:  Negative for joint pain, gait problem, joint pain, joint swelling, myalgias, muscle weakness, morning stiffness, muscle tenderness and myalgias.  Skin:  Negative for color change, rash, hair loss and sensitivity to sunlight.  Allergic/Immunologic: Negative for susceptible to infections.  Neurological:  Negative for dizziness and headaches.  Hematological:  Negative for swollen glands.  Psychiatric/Behavioral:  Negative for depressed mood and sleep disturbance. The patient is not nervous/anxious.     PMFS History:  Patient Active Problem List   Diagnosis Date Noted   History of colonic polyps 12/01/2022   High risk medication use 06/03/2021   Proliferative diabetic retinopathy of left eye with macular edema  associated with type 2 diabetes mellitus (HCC) 05/27/2021   Type 2 diabetes mellitus with diabetic retinopathy (HCC) 05/25/2021   Seropositive rheumatoid arthritis (HCC) 04/14/2018   Atypical squamous cell changes of undetermined significance (ASCUS) on vaginal cytology 08/23/2013   Partial nontraumatic tear of rotator cuff 10/18/2012   Rotator cuff syndrome of right shoulder 10/18/2012    Past Medical History:  Diagnosis Date   Diabetes mellitus without complication (HCC)    Rheumatoid arthritis (HCC)    Rotator cuff tear     Family History  Problem Relation Age of Onset   Diabetes Mother    Heart disease Mother    Diabetes Father    COPD Sister    Hypertension Brother    Luiz Blare' disease Daughter    Colon cancer Neg Hx    Past Surgical History:  Procedure Laterality Date   COLONOSCOPY N/A 09/27/2012   Procedure: COLONOSCOPY;  Surgeon: Malissa Hippo, MD;  [facility];  Service: Endoscopy;  Laterality: N/A;  1030   COLONOSCOPY WITH PROPOFOL N/A 12/01/2022   Procedure: COLONOSCOPY WITH PROPOFOL;  Surgeon: Dolores Frame, MD;  Location[facility];  Service: Gastroenterology;  Laterality: N/A;  11:45AM;ASA 1-2   Dilation and Currettage     POLYPECTOMY  12/01/2022   Procedure: POLYPECTOMY;  Surgeon: Marguerita Merles, Reuel Boom, MD;  Location: [facility];  Service: Gastroenterology;;   TUBAL LIGATION     Social History   Social History Narrative   Not on file   Immunization History  Administered Date(s) Administered   Influenza Inj Mdck Quad Pf 05/12/2018, 04/21/2019   Influenza,inj,Quad PF,6+ Mos 05/07/2017   Influenza-Unspecified 05/11/2021   Moderna SARS-COV2 Booster Vaccination 08/11/2021   Moderna Sars-Covid-2 Vaccination 11/08/2019, 12/04/2019     Objective: Vital Signs: BP 114/68 (BP Location: Left Arm, Patient Position: Sitting, Cuff Size: Normal)   Pulse 74   Resp 14   Ht 5\' 4"  (1.626 m)   Wt 213 lb (96.6 kg)   LMP 05/25/2016   BMI  36.56 kg/m    Physical Exam Eyes:     Conjunctiva/sclera: Conjunctivae normal.  Cardiovascular:     Rate and Rhythm: Normal rate and regular rhythm.  Pulmonary:     Effort: Pulmonary effort is normal.     Breath sounds: Normal breath sounds.  Lymphadenopathy:     Cervical: No cervical adenopathy.  Skin:    General: Skin is warm and dry.  Neurological:     Mental Status: She is alert.  Psychiatric:        Mood and Affect: Mood normal.      Musculoskeletal Exam:  Shoulders full ROM no tenderness or swelling Elbows full ROM no tenderness or swelling Wrists full ROM no tenderness or swelling Fingers full ROM no tenderness or swelling Knees full ROM no tenderness or swelling   CDAI Exam: CDAI Score: 0  Patient Global: 0 / 100; Provider Global: 0 / 100 Swollen: 0 ; Tender: 0  Joint Exam 03/10/2023   All documented joints were normal     Investigation: No additional findings.  Imaging: Fluorescein Angiography Optos (Transit OS)  Result Date: 03/09/2023 Right Eye Progression has worsened. Early phase findings include microaneurysm, retinal neovascularization, vascular perfusion defect. Mid/Late phase findings include leakage, microaneurysm, retinal neovascularization, vascular perfusion defect (Mild NV greatest IT macula and inferior midzone). Left Eye Progression has worsened. Early phase findings include microaneurysm, retinal neovascularization, vascular perfusion defect. Mid/Late phase findings include leakage, microaneurysm, retinal neovascularization, vascular perfusion defect (Scattered NV 360 midzone, new leakage from disc (mild NVD)). Notes **Images stored on drive** Impression: PDR OU OD: Mild NV greatest IT macula and inferior midzone OS: Scattered NV 360 midzone, new leakage from disc (mild NVD) Vascular perfusion defect OU (OS>OD)  OCT, Retina - OU - Both Eyes  Result Date: 03/09/2023 Right Eye Quality was good. Central Foveal Thickness: 292. Progression has  improved. Findings include normal foveal contour, no SRF, intraretinal fluid (Mild focal IRF/cystic changes temporal macula---mildly improved from 2020 studies). Left Eye Quality was good. Central Foveal Thickness: 303. Progression has improved. Findings include normal foveal contour, no SRF, intraretinal fluid, vitreomacular adhesion (Trace cystic changes temporal macula--slightly improved from 2020 studies; interval release of VMA to partial PVD from 2020). Notes *Images captured and stored on drive Diagnosis / Impression: NFP, no SRF, +IRF OU OD: mild IRF/cystic changes temporal macula---mildly improved from 2020 studies OS: trace cystic changes temporal macula--mildly improved from 2020 studies; interval release of VMA to partial PVD since 2020 Clinical management: See below Abbreviations: NFP - Normal foveal profile. CME - cystoid macular edema. PED - pigment epithelial detachment. IRF - intraretinal fluid. SRF -  subretinal fluid. EZ - ellipsoid zone. ERM - epiretinal membrane. ORA - outer retinal atrophy. ORT - outer retinal tubulation. SRHM - subretinal hyper-reflective material    Recent Labs: Lab Results  Component Value Date   WBC 6.6 10/20/2022   HGB 11.5 (L) 10/20/2022   PLT 252 10/20/2022   NA 141 10/20/2022   K 4.5 10/20/2022   CL 104 10/20/2022   CO2 26 10/20/2022   GLUCOSE 172 (H) 10/20/2022   BUN 15 10/20/2022   CREATININE 0.73 10/20/2022   BILITOT 0.3 10/20/2022   AST 16 10/20/2022   ALT 16 10/20/2022   PROT 7.1 10/20/2022   CALCIUM 9.2 10/20/2022   GFRAA  05/15/2008    >60        The eGFR has been calculated using the MDRD equation. This calculation has not been validated in all clinical    Speciality Comments: No specialty comments available.  Procedures:  No procedures performed Allergies: Patient has no known allergies.   Assessment / Plan:     Visit Diagnoses: Seropositive rheumatoid arthritis (HCC) - Plan: Sedimentation rate  Symptoms appear very  well-controlled no flareups no peripheral joint synovitis appreciable on exam today.  Checking sed rate for disease activity monitoring.  Has been mildly elevated consistently on past 2 visits but without disease activity clinically.  Plan to continue methotrexate 20 mg p.o. weekly folic acid 1 mg daily and hydroxychloroquine 200 mg daily.  High risk medication use - methotrexate 20 mg p.o. weekly folic acid 1 mg daily and hydroxychloroquine 200 mg daily. Needs PLQ eye exam. - Plan: CBC with Differential/Platelet, COMPLETE METABOLIC PANEL WITH GFR  Checking CBC and CMP for medication monitoring on continued Plaquenil and methotrexate.  Recent hydroxychloroquine eye exam was fine.  No serious interval infections.  Orders: Orders Placed This Encounter  Procedures   Sedimentation rate   CBC with Differential/Platelet   COMPLETE METABOLIC PANEL WITH GFR   No orders of the defined types were placed in this encounter.    Follow-Up Instructions: Return in about 3 months (around 06/10/2023) for RA on MTX/HCA f/u 3mos.   Fuller Plan, MD  Note - This record has been created using [employer].  Chart creation errors have been sought, but may not always  have been located. Such creation errors do not reflect on  the standard of medical care.

## 2023-03-09 ENCOUNTER — Ambulatory Visit (INDEPENDENT_AMBULATORY_CARE_PROVIDER_SITE_OTHER): Payer: 59 | Admitting: Ophthalmology

## 2023-03-09 ENCOUNTER — Encounter (INDEPENDENT_AMBULATORY_CARE_PROVIDER_SITE_OTHER): Payer: Self-pay | Admitting: Ophthalmology

## 2023-03-09 DIAGNOSIS — H35033 Hypertensive retinopathy, bilateral: Secondary | ICD-10-CM

## 2023-03-09 DIAGNOSIS — Z7984 Long term (current) use of oral hypoglycemic drugs: Secondary | ICD-10-CM | POA: Diagnosis not present

## 2023-03-09 DIAGNOSIS — M059 Rheumatoid arthritis with rheumatoid factor, unspecified: Secondary | ICD-10-CM | POA: Diagnosis not present

## 2023-03-09 DIAGNOSIS — Z79899 Other long term (current) drug therapy: Secondary | ICD-10-CM | POA: Diagnosis not present

## 2023-03-09 DIAGNOSIS — H25813 Combined forms of age-related cataract, bilateral: Secondary | ICD-10-CM

## 2023-03-09 DIAGNOSIS — E113513 Type 2 diabetes mellitus with proliferative diabetic retinopathy with macular edema, bilateral: Secondary | ICD-10-CM

## 2023-03-09 DIAGNOSIS — I10 Essential (primary) hypertension: Secondary | ICD-10-CM

## 2023-03-09 DIAGNOSIS — Z6836 Body mass index (BMI) 36.0-36.9, adult: Secondary | ICD-10-CM | POA: Diagnosis not present

## 2023-03-09 DIAGNOSIS — Z0000 Encounter for general adult medical examination without abnormal findings: Secondary | ICD-10-CM | POA: Diagnosis not present

## 2023-03-09 NOTE — Progress Notes (Signed)
[facility] - Clinic Note  03/09/2023     CHIEF COMPLAINT Patient presents for Retina Evaluation   HISTORY OF PRESENT ILLNESS: Brooke Sawyer is a 62 y.o. female who presents to the clinic today for:   HPI     Retina Evaluation   In both eyes.  I, the attending physician,  performed the HPI with the patient and updated documentation appropriately.        Comments   Patient here for Retina Evaluation. Referred by Dr Daisy Lazar. Patient states vision doing good. No eye pain. Not using drops.       Last edited by Rennis Chris, MD on 03/09/2023 11:21 PM.    Patient re-referred by Dr. Charise Killian for diabetic retinal evaluation. Patient states vision seems good OU. Last A1c was 7.9, checked on 01.25.23. Patient is on plaquenil for the past 2 years for RA. Sees Dr. Dimple Casey for RA. Pt was seen once here in 2020 where NVE was noted and scheduled for PRP OS -- but was lost to f/u following that initial visit  Referring physician: Daisy Lazar, DO [address] [place],  [place] [address]  HISTORICAL INFORMATION:   Selected notes from the MEDICAL RECORD NUMBER    CURRENT MEDICATIONS: No current outpatient medications on file. (Ophthalmic Drugs)   No current facility-administered medications for this visit. (Ophthalmic Drugs)   Current Outpatient Medications (Other)  Medication Sig   atorvastatin (LIPITOR) 20 MG tablet TAKE 1 TABLET BY MOUTH EVERYDAY AT BEDTIME   Cholecalciferol (VITAMIN D) 50 MCG (2000 UT) tablet Take 2,000 Units by mouth daily.   Cyanocobalamin (B-12 PO) Take 1,000 mcg by mouth daily.   folic acid (FOLVITE) 1 MG tablet TAKE 1 TABLET BY MOUTH EVERY DAY   glipiZIDE (GLUCOTROL) 10 MG tablet Take 10 mg by mouth 2 (two) times daily.   hydroxychloroquine (PLAQUENIL) 200 MG tablet TAKE 1 TABLET BY MOUTH EVERY DAY   losartan (COZAAR) 25 MG tablet Take 25 mg by mouth daily.   metFORMIN (GLUCOPHAGE) 500 MG tablet TAKE 2 TABLETS BY MOUTH TWICE A DAY WITH  MORNING AND EVENING MEALS (Patient taking differently: Take 500-1,000 mg by mouth See admin instructions. Take 1000 mg in the morning and 500 mg in the evening)   methotrexate (RHEUMATREX) 2.5 MG tablet TAKE 8 TABLETS (20 MG TOTAL) BY MOUTH ONCE A WEEK. CAUTION:CHEMOTHERAPY. PROTECT FROM LIGHT.   Omega-3 Fatty Acids (FISH OIL OMEGA-3) 1000 MG CAPS Take 1,000 mg by mouth daily.   JARDIANCE 25 MG TABS tablet Take 25 mg by mouth every morning. (Patient not taking: Reported on 03/09/2023)   levocetirizine (XYZAL) 5 MG tablet Take 5 mg by mouth daily as needed for allergies. (Patient not taking: Reported on 03/09/2023)   No current facility-administered medications for this visit. (Other)   REVIEW OF SYSTEMS: ROS   Positive for: Endocrine, Eyes Negative for: Constitutional, Gastrointestinal, Neurological, Skin, Genitourinary, Musculoskeletal, HENT, Cardiovascular, Respiratory, Psychiatric, Allergic/Imm, Heme/Lymph Last edited by Laddie Aquas, COA on 03/09/2023  2:05 PM.     ALLERGIES No Known Allergies  PAST MEDICAL HISTORY Past Medical History:  Diagnosis Date   Diabetes mellitus without complication (HCC)    Rheumatoid arthritis (HCC)    Rotator cuff tear    Past Surgical History:  Procedure Laterality Date   COLONOSCOPY N/A 09/27/2012   Procedure: COLONOSCOPY;  Surgeon: Malissa Hippo, MD;  Location: [facility];  Service: Endoscopy;  Laterality: N/A;  1030   COLONOSCOPY WITH PROPOFOL N/A  12/01/2022   Procedure: COLONOSCOPY WITH PROPOFOL;  Surgeon: Dolores Frame, MD;  Location: [facility];  Service: Gastroenterology;  Laterality: N/A;  11:45AM;ASA 1-2   Dilation and Currettage     POLYPECTOMY  12/01/2022   Procedure: POLYPECTOMY;  Surgeon: Marguerita Merles, Reuel Boom, MD;  Location: [facility];  Service: Gastroenterology;;   TUBAL LIGATION      FAMILY HISTORY Family History  Problem Relation Age of Onset   Diabetes Mother    Heart disease Mother    Diabetes Father     COPD Sister    Hypertension Brother    Graves' disease Daughter    Colon cancer Neg Hx     SOCIAL HISTORY Social History   Tobacco Use   Smoking status: Former    Current packs/day: 0.00    Average packs/day: 0.5 packs/day for 6.0 years (3.0 ttl pk-yrs)    Types: Cigarettes    Start date: 42    Quit date: 84    Years since quitting: 32.6    Passive exposure: Never   Smokeless tobacco: Never  Vaping Use   Vaping status: Never Used  Substance Use Topics   Alcohol use: No   Drug use: No       OPHTHALMIC EXAM:   Base Eye Exam     Visual Acuity (Snellen - Linear)       Right Left   Dist [place] 20/25 -2 20/25   Dist ph [place] 20/20 -1 20/20 -2         Tonometry (Tonopen, 2:02 PM)       Right Left   Pressure 15 16         Pupils       Dark Light Shape React APD   Right 3 2 Round Brisk None   Left 3 2 Round Brisk None         Visual Fields (Counting fingers)       Left Right    Full Full         Extraocular Movement       Right Left    Full, Ortho Full, Ortho         Neuro/Psych     Oriented x3: Yes   Mood/Affect: Normal         Dilation     Both eyes: 1.0% Mydriacyl, 2.5% Phenylephrine @ 2:02 PM           Slit Lamp and Fundus Exam     Slit Lamp Exam       Right Left   Lids/Lashes Normal Normal   Conjunctiva/Sclera mild melanosis Trace Melanosis   Cornea trace Arcus, trace Punctate epithelial erosions trace Arcus, trace Punctate epithelial erosions   Anterior Chamber Deep and quiet Deep and quiet   Iris Round and dilated, No NVI Round and dilated, No NVI   Lens 2+ Nuclear sclerosis, 2+ Cortical cataract, early brunescense 2+ Nuclear sclerosis, 2+ Cortical cataract mild brunescense   Anterior Vitreous trace Vitreous syneresis trace Vitreous syneresis         Fundus Exam       Right Left   Disc Pink and Sharp, no NVD Pink and Sharp, +fine NVD and early fibrosis nasal disc   C/D Ratio 0.3 0.3   Macula Flat, Good  foveal reflex, rare MA Flat, Good foveal reflex, scattered Microaneurysms/DBH   Vessels Vascular attenuation, Tortuous, copper wiring, mild, early NV Vascular attenuation, Tortuous, copper wiring, +NV   Periphery Attached, rare MA, early focal  NV--scattered Attached, scattered MA/DBH, focal patches of fibrosis and NV            IMAGING AND PROCEDURES  Imaging and Procedures for @TODAY @  OCT, Retina - OU - Both Eyes       Right Eye Quality was good. Central Foveal Thickness: 292. Progression has improved. Findings include normal foveal contour, no SRF, intraretinal fluid (Mild focal IRF/cystic changes temporal macula---mildly improved from 2020 studies).   Left Eye Quality was good. Central Foveal Thickness: 303. Progression has improved. Findings include normal foveal contour, no SRF, intraretinal fluid, vitreomacular adhesion (Trace cystic changes temporal macula--slightly improved from 2020 studies; interval release of VMA to partial PVD from 2020).   Notes *Images captured and stored on drive  Diagnosis / Impression:  NFP, no SRF, +IRF OU OD: mild IRF/cystic changes temporal macula---mildly improved from 2020 studies OS: trace cystic changes temporal macula--mildly improved from 2020 studies; interval release of VMA to partial PVD since 2020  Clinical management:  See below  Abbreviations: NFP - Normal foveal profile. CME - cystoid macular edema. PED - pigment epithelial detachment. IRF - intraretinal fluid. SRF - subretinal fluid. EZ - ellipsoid zone. ERM - epiretinal membrane. ORA - outer retinal atrophy. ORT - outer retinal tubulation. SRHM - subretinal hyper-reflective material      Fluorescein Angiography Optos (Transit OS)       Right Eye Progression has worsened. Early phase findings include microaneurysm, retinal neovascularization, vascular perfusion defect. Mid/Late phase findings include leakage, microaneurysm, retinal neovascularization, vascular perfusion  defect (Mild NV greatest IT macula and inferior midzone).   Left Eye Progression has worsened. Early phase findings include microaneurysm, retinal neovascularization, vascular perfusion defect. Mid/Late phase findings include leakage, microaneurysm, retinal neovascularization, vascular perfusion defect (Scattered NV 360 midzone, new leakage from disc (mild NVD)).   Notes **Images stored on drive**  Impression: PDR OU OD: Mild NV greatest IT macula and inferior midzone OS: Scattered NV 360 midzone, new leakage from disc (mild NVD) Vascular perfusion defect OU (OS>OD)             ASSESSMENT/PLAN:    ICD-10-CM   1. Proliferative diabetic retinopathy of both eyes with macular edema associated with type 2 diabetes mellitus (HCC)  E11.3513 OCT, Retina - OU - Both Eyes    Fluorescein Angiography Optos (Transit OS)    2. Long term (current) use of oral hypoglycemic drugs  Z79.84     3. Essential hypertension  I10     4. Hypertensive retinopathy of both eyes  H35.033 Fluorescein Angiography Optos (Transit OS)    5. Combined forms of age-related cataract of both eyes  H25.813      1,2. Proliferative diabetic retinopathy w/ DME OU  - Pt lost to f/u after initial visit in 2020  - FA in 2020 showed NVE OS -- pt was scheduled for PRP OS but never made it back - The incidence, risk factors for progression, natural history and treatment options for diabetic retinopathy were discussed with patient.   - The need for close monitoring of blood glucose, blood pressure, and serum lipids, avoiding cigarette or any type of tobacco, and the need for long term follow up was also discussed with patient. - exam shows 360 MA OU and now NV OU - FA today (08.07.24) shows OD: Mild NV greatest IT macula and inferior midzone; OS: Scattered NV 360 midzone, new leakage from disc (mild NVD) -- pt would benefit from PRP OU - recommend avastin OS and PRP OU, need  insurance approval - pt wishes to proceed with  laser treatment at next visit - f/u 03/16/23 for avastin OS +/- PRP   3,4. Hypertensive retinopathy OU - discussed importance of tight BP control - monitor  5. Mixed form age related cataracts OU  - The symptoms of cataract, surgical options, and treatments and risks were discussed with patient. - discussed diagnosis and progression - monitor   Ophthalmic Meds Ordered this visit:  No orders of the defined types were placed in this encounter.    Return in about 1 week (around 03/16/2023) for DFE, OCT.  There are no Patient Instructions on file for this visit.   Explained the diagnoses, plan, and follow up with the patient and they expressed understanding.  Patient expressed understanding of the importance of proper follow up care.   This document serves as a record of services personally performed by Karie Chimera, MD, PhD. It was created on their behalf by Glee Arvin. Manson Passey, OA an ophthalmic technician. The creation of this record is the provider's dictation and/or activities during the visit.    Electronically signed by: Glee Arvin. Manson Passey, OA 03/09/23 11:29 PM   Karie Chimera, M.D., Ph.D. Diseases & Surgery of the Retina and Vitreous [facility]  I have reviewed the above documentation for accuracy and completeness, and I agree with the above. Karie Chimera, M.D., Ph.D. 03/09/23 11:34 PM    Abbreviations: M myopia (nearsighted); A astigmatism; H hyperopia (farsighted); P presbyopia; Mrx spectacle prescription;  CTL contact lenses; OD right eye; OS left eye; OU both eyes  XT exotropia; ET esotropia; PEK punctate epithelial keratitis; PEE punctate epithelial erosions; DES dry eye syndrome; MGD meibomian gland dysfunction; ATs artificial tears; PFAT's preservative free artificial tears; NSC nuclear sclerotic cataract; PSC posterior subcapsular cataract; ERM epi-retinal membrane; PVD posterior vitreous detachment; RD retinal detachment; DM diabetes mellitus;  DR diabetic retinopathy; NPDR non-proliferative diabetic retinopathy; PDR proliferative diabetic retinopathy; CSME clinically significant macular edema; DME diabetic macular edema; dbh dot blot hemorrhages; CWS cotton wool spot; POAG primary open angle glaucoma; C/D cup-to-disc ratio; HVF humphrey visual field; GVF goldmann visual field; OCT optical coherence tomography; IOP intraocular pressure; BRVO Branch retinal vein occlusion; CRVO central retinal vein occlusion; CRAO central retinal artery occlusion; BRAO branch retinal artery occlusion; RT retinal tear; SB scleral buckle; PPV pars plana vitrectomy; VH Vitreous hemorrhage; PRP panretinal laser photocoagulation; IVK intravitreal kenalog; VMT vitreomacular traction; MH Macular hole;  NVD neovascularization of the disc; NVE neovascularization elsewhere; AREDS age related eye disease study; ARMD age related macular degeneration; POAG primary open angle glaucoma; EBMD epithelial/anterior basement membrane dystrophy; ACIOL anterior chamber intraocular lens; IOL intraocular lens; PCIOL posterior chamber intraocular lens; Phaco/IOL phacoemulsification with intraocular lens placement; PRK photorefractive keratectomy; LASIK laser assisted in situ keratomileusis; HTN hypertension; DM diabetes mellitus; COPD chronic obstructive pulmonary disease

## 2023-03-10 ENCOUNTER — Ambulatory Visit: Payer: 59 | Attending: Internal Medicine | Admitting: Internal Medicine

## 2023-03-10 ENCOUNTER — Encounter: Payer: Self-pay | Admitting: Internal Medicine

## 2023-03-10 VITALS — BP 114/68 | HR 74 | Resp 14 | Ht 64.0 in | Wt 213.0 lb

## 2023-03-10 DIAGNOSIS — Z79899 Other long term (current) drug therapy: Secondary | ICD-10-CM | POA: Diagnosis not present

## 2023-03-10 DIAGNOSIS — M059 Rheumatoid arthritis with rheumatoid factor, unspecified: Secondary | ICD-10-CM | POA: Diagnosis not present

## 2023-03-10 LAB — CBC WITH DIFFERENTIAL/PLATELET
Absolute Monocytes: 567 cells/uL (ref 200–950)
Basophils Absolute: 50 cells/uL (ref 0–200)
Basophils Relative: 0.8 %
Eosinophils Absolute: 328 cells/uL (ref 15–500)
Eosinophils Relative: 5.2 %
HCT: 33.6 % — ABNORMAL LOW (ref 35.0–45.0)
Hemoglobin: 11 g/dL — ABNORMAL LOW (ref 11.7–15.5)
Lymphs Abs: 1670 cells/uL (ref 850–3900)
MCH: 27.7 pg (ref 27.0–33.0)
MCHC: 32.7 g/dL (ref 32.0–36.0)
MCV: 84.6 fL (ref 80.0–100.0)
MPV: 11.6 fL (ref 7.5–12.5)
Monocytes Relative: 9 %
Neutro Abs: 3686 cells/uL (ref 1500–7800)
Neutrophils Relative %: 58.5 %
Platelets: 229 10*3/uL (ref 140–400)
RBC: 3.97 10*6/uL (ref 3.80–5.10)
RDW: 15.1 % — ABNORMAL HIGH (ref 11.0–15.0)
Total Lymphocyte: 26.5 %
WBC: 6.3 10*3/uL (ref 3.8–10.8)

## 2023-03-10 NOTE — Progress Notes (Signed)
[facility] - Clinic Note  03/15/2023     CHIEF COMPLAINT Patient presents for Retina Follow Up   HISTORY OF PRESENT ILLNESS: Brooke Sawyer is a 62 y.o. female who presents to the clinic today for:   HPI     Retina Follow Up   Patient presents with  Diabetic Retinopathy.  In both eyes.  This started years ago.  Duration of 1 week.  I, the attending physician,  performed the HPI with the patient and updated documentation appropriately.        Comments   Patient feels that vision is the same. She is not using eye drops. Her blood sugar was 135.      Last edited by Rennis Chris, MD on 03/15/2023 11:31 AM.     Patient feels the vision is the same.   Referring physician: Daisy Lazar, DO [address] [place],  [place] [address]  HISTORICAL INFORMATION:   Selected notes from the MEDICAL RECORD NUMBER    CURRENT MEDICATIONS: Current Outpatient Medications (Ophthalmic Drugs)  Medication Sig   prednisoLONE acetate (PRED FORTE) 1 % ophthalmic suspension Place 1 drop into the left eye 4 (four) times daily for 7 days.   No current facility-administered medications for this visit. (Ophthalmic Drugs)   Current Outpatient Medications (Other)  Medication Sig   atorvastatin (LIPITOR) 20 MG tablet TAKE 1 TABLET BY MOUTH EVERYDAY AT BEDTIME   Cholecalciferol (VITAMIN D) 50 MCG (2000 UT) tablet Take 2,000 Units by mouth daily.   Cyanocobalamin (B-12 PO) Take 1,000 mcg by mouth daily.   folic acid (FOLVITE) 1 MG tablet TAKE 1 TABLET BY MOUTH EVERY DAY   glipiZIDE (GLUCOTROL) 10 MG tablet Take 10 mg by mouth 2 (two) times daily.   hydroxychloroquine (PLAQUENIL) 200 MG tablet TAKE 1 TABLET BY MOUTH EVERY DAY   JARDIANCE 25 MG TABS tablet Take 25 mg by mouth every morning.   levocetirizine (XYZAL) 5 MG tablet Take 5 mg by mouth daily as needed for allergies.   losartan (COZAAR) 25 MG tablet Take 25 mg by mouth daily.   metFORMIN (GLUCOPHAGE) 500 MG tablet TAKE 2  TABLETS BY MOUTH TWICE A DAY WITH MORNING AND EVENING MEALS (Patient taking differently: Take 500-1,000 mg by mouth See admin instructions. Take 1000 mg in the morning and 500 mg in the evening)   methotrexate (RHEUMATREX) 2.5 MG tablet TAKE 8 TABLETS (20 MG TOTAL) BY MOUTH ONCE A WEEK. CAUTION:CHEMOTHERAPY. PROTECT FROM LIGHT.   Omega-3 Fatty Acids (FISH OIL OMEGA-3) 1000 MG CAPS Take 1,000 mg by mouth daily.   No current facility-administered medications for this visit. (Other)   REVIEW OF SYSTEMS: ROS   Positive for: Endocrine, Eyes Negative for: Constitutional, Gastrointestinal, Neurological, Skin, Genitourinary, Musculoskeletal, HENT, Cardiovascular, Respiratory, Psychiatric, Allergic/Imm, Heme/Lymph Last edited by Charlette Caffey, COT on 03/15/2023  9:27 AM.     ALLERGIES No Known Allergies  PAST MEDICAL HISTORY Past Medical History:  Diagnosis Date   Diabetes mellitus without complication (HCC)    Rheumatoid arthritis (HCC)    Rotator cuff tear    Past Surgical History:  Procedure Laterality Date   COLONOSCOPY N/A 09/27/2012   Procedure: COLONOSCOPY;  Surgeon: Malissa Hippo, MD;  [facility]: [facility];  Service: Endoscopy;  Laterality: N/A;  1030   COLONOSCOPY WITH PROPOFOL N/A 12/01/2022   Procedure: COLONOSCOPY WITH PROPOFOL;  Surgeon: Dolores Frame, MD;  Location: [facility];  Service: Gastroenterology;  Laterality: N/A;  11:45AM;ASA 1-2  Dilation and [place]     POLYPECTOMY  12/01/2022   Procedure: POLYPECTOMY;  Surgeon: Marguerita Merles, Reuel Boom, MD;  Location: [facility];  Service: Gastroenterology;;   TUBAL LIGATION     FAMILY HISTORY Family History  Problem Relation Age of Onset   Diabetes Mother    Heart disease Mother    Diabetes Father    COPD Sister    Hypertension Brother    Graves' disease Daughter    Colon cancer Neg Hx    SOCIAL HISTORY Social History   Tobacco Use   Smoking status: Former    Current packs/day: 0.00     Average packs/day: 0.5 packs/day for 6.0 years (3.0 ttl pk-yrs)    Types: Cigarettes    Start date: 16    Quit date: 55    Years since quitting: 32.6    Passive exposure: Never   Smokeless tobacco: Never  Vaping Use   Vaping status: Never Used  Substance Use Topics   Alcohol use: No   Drug use: No       OPHTHALMIC EXAM:   Base Eye Exam     Visual Acuity (Snellen - Linear)       Right Left   Dist [place] 20/25 +1 20/25   Dist ph [place] 20/20 NI         Tonometry (Tonopen, 9:31 AM)       Right Left   Pressure 14 16         Pupils       Dark Light Shape React APD   Right 3 2 Round Brisk None   Left 3 2 Round Brisk None         Visual Fields       Left Right    Full Full         Extraocular Movement       Right Left    Full, Ortho Full, Ortho         Neuro/Psych     Oriented x3: Yes   Mood/Affect: Normal         Dilation     Both eyes: 1.0% Mydriacyl, 2.5% Phenylephrine @ 9:28 AM           Slit Lamp and Fundus Exam     Slit Lamp Exam       Right Left   Lids/Lashes Normal Normal   Conjunctiva/Sclera mild melanosis Trace Melanosis   Cornea trace Arcus, trace Punctate epithelial erosions trace Arcus, trace Punctate epithelial erosions   Anterior Chamber Deep and quiet Deep and quiet   Iris Round and dilated, No NVI Round and dilated, No NVI   Lens 2+ Nuclear sclerosis, 2+ Cortical cataract, early brunescense 2+ Nuclear sclerosis, 2+ Cortical cataract mild brunescense   Anterior Vitreous trace Vitreous syneresis trace Vitreous syneresis         Fundus Exam       Right Left   Disc Pink and Sharp, no NVD Pink and Sharp, +fine NVD and early fibrosis nasal disc   C/D Ratio 0.3 0.3   Macula Flat, Good foveal reflex, rare MA Flat, Good foveal reflex, scattered Microaneurysms/DBH   Vessels Vascular attenuation, Tortuous, copper wiring, early NV Vascular attenuation, Tortuous, copper wiring, +NV   Periphery Attached, rare MA, early  focal NV--scattered Attached, scattered MA/DBH, focal patches of fibrosis and NV            IMAGING AND PROCEDURES  Imaging and Procedures for @TODAY @  OCT, Retina - OU -  Both Eyes       Right Eye Quality was good. Central Foveal Thickness: 293. Progression has been stable. Findings include normal foveal contour, no SRF, intraretinal fluid (Mild focal IRF/cystic changes temporal macula---mildly improved from 2020 studies).   Left Eye Quality was good. Central Foveal Thickness: 303. Progression has been stable. Findings include normal foveal contour, no SRF, intraretinal fluid, vitreomacular adhesion (Trace cystic changes temporal macula--slightly improved from 2020 studies; partial PVD).   Notes *Images captured and stored on drive  Diagnosis / Impression:  NFP, no SRF, +IRF OU OD: mild IRF/cystic changes temporal macula---mildly improved from 2020 studies XL:KGMWN cystic changes temporal macula--slightly improved from 2020 studies; partial PVD  Clinical management:  See below  Abbreviations: NFP - Normal foveal profile. CME - cystoid macular edema. PED - pigment epithelial detachment. IRF - intraretinal fluid. SRF - subretinal fluid. EZ - ellipsoid zone. ERM - epiretinal membrane. ORA - outer retinal atrophy. ORT - outer retinal tubulation. SRHM - subretinal hyper-reflective material      Panretinal Photocoagulation - OS - Left Eye       Anesthesia Anesthetic medications included Proparacaine 0.5%.   Notes LASER PROCEDURE NOTE  Diagnosis:   Proliferative Diabetic Retinopathy, LEFT EYE  Procedure:  Pan-retinal photocoagulation using slit lamp laser, LEFT EYE  Anesthesia:  Topical  Surgeon: Rennis Chris, MD, PhD   Informed consent obtained, operative eye marked, and time out performed prior to initiation of laser.   Lumenis [id number] slit lamp laser Pattern: 3x3 square Power: 270 mW Duration: 30 msec  Spot size: 200 microns  # spots: 2194  spots  Complications: None.  RTC: 4 wks - DFE/OCT  Patient tolerated the procedure well and received written and verbal post-procedure care information/education.           ASSESSMENT/PLAN:    ICD-10-CM   1. Proliferative diabetic retinopathy of both eyes with macular edema associated with type 2 diabetes mellitus (HCC)  E11.3513 OCT, Retina - OU - Both Eyes    Panretinal Photocoagulation - OS - Left Eye    2. Long term (current) use of oral hypoglycemic drugs  Z79.84     3. Essential hypertension  I10     4. Hypertensive retinopathy of both eyes  H35.033     5. Combined forms of age-related cataract of both eyes  H25.813      1,2. Proliferative diabetic retinopathy w/ DME OU  - Pt lost to f/u after initial visit in 2020  - last A1c 7.9 on 01.25.23 - FA in 2020 showed NVE OS -- pt was scheduled for PRP OS but never made it back - repeat FA (08.07.24) shows OD: Mild NV greatest IT macula and inferior midzone; OS: Scattered NV 360 midzone, new leakage from disc (mild NVD) -- pt would benefit from PRP OU - exam shows 360 MA OU and now NV OU - recommend PRP OS today, 08.13.24 - pt wishes to proceed with laser - RBA of procedure discussed, questions answered - PRP informed consent obtained and signed, 08.13.24 (OS) - see procedure note - f/u 4 weeks s/p PRP OS, DFE, OCT; possible PRP OD  3,4. Hypertensive retinopathy OU - discussed importance of tight BP control - monitor  5. Mixed form age related cataracts OU  - The symptoms of cataract, surgical options, and treatments and risks were discussed with patient. - discussed diagnosis and progression - monitor   Ophthalmic Meds Ordered this visit:  Meds ordered this encounter  Medications   prednisoLONE acetate (  PRED FORTE) 1 % ophthalmic suspension    Sig: Place 1 drop into the left eye 4 (four) times daily for 7 days.    Dispense:  10 mL    Refill:  0     Return in about 4 weeks (around 04/12/2023) for PDR OU,  DFE, OCT.  There are no Patient Instructions on file for this visit.   Explained the diagnoses, plan, and follow up with the patient and they expressed understanding.  Patient expressed understanding of the importance of proper follow up care.   This document serves as a record of services personally performed by Karie Chimera, MD, PhD. It was created on their behalf by Gerilyn Nestle, COT an ophthalmic technician. The creation of this record is the provider's dictation and/or activities during the visit.    Electronically signed by:  Charlette Caffey, COT  03/15/23 11:39 AM  Karie Chimera, M.D., Ph.D. Diseases & Surgery of the Retina and Vitreous [facility]  I have reviewed the above documentation for accuracy and completeness, and I agree with the above. Karie Chimera, M.D., Ph.D. 03/15/23 11:40 AM   Abbreviations: M myopia (nearsighted); A astigmatism; H hyperopia (farsighted); P presbyopia; Mrx spectacle prescription;  CTL contact lenses; OD right eye; OS left eye; OU both eyes  XT exotropia; ET esotropia; PEK punctate epithelial keratitis; PEE punctate epithelial erosions; DES dry eye syndrome; MGD meibomian gland dysfunction; ATs artificial tears; PFAT's preservative free artificial tears; NSC nuclear sclerotic cataract; PSC posterior subcapsular cataract; ERM epi-retinal membrane; PVD posterior vitreous detachment; RD retinal detachment; DM diabetes mellitus; DR diabetic retinopathy; NPDR non-proliferative diabetic retinopathy; PDR proliferative diabetic retinopathy; CSME clinically significant macular edema; DME diabetic macular edema; dbh dot blot hemorrhages; CWS cotton wool spot; POAG primary open angle glaucoma; C/D cup-to-disc ratio; HVF humphrey visual field; GVF goldmann visual field; OCT optical coherence tomography; IOP intraocular pressure; BRVO Branch retinal vein occlusion; CRVO central retinal vein occlusion; CRAO central retinal artery  occlusion; BRAO branch retinal artery occlusion; RT retinal tear; SB scleral buckle; PPV pars plana vitrectomy; VH Vitreous hemorrhage; PRP panretinal laser photocoagulation; IVK intravitreal kenalog; VMT vitreomacular traction; MH Macular hole;  NVD neovascularization of the disc; NVE neovascularization elsewhere; AREDS age related eye disease study; ARMD age related macular degeneration; POAG primary open angle glaucoma; EBMD epithelial/anterior basement membrane dystrophy; ACIOL anterior chamber intraocular lens; IOL intraocular lens; PCIOL posterior chamber intraocular lens; Phaco/IOL phacoemulsification with intraocular lens placement; PRK photorefractive keratectomy; LASIK laser assisted in situ keratomileusis; HTN hypertension; DM diabetes mellitus; COPD chronic obstructive pulmonary disease

## 2023-03-15 ENCOUNTER — Ambulatory Visit (INDEPENDENT_AMBULATORY_CARE_PROVIDER_SITE_OTHER): Payer: 59 | Admitting: Ophthalmology

## 2023-03-15 ENCOUNTER — Encounter (INDEPENDENT_AMBULATORY_CARE_PROVIDER_SITE_OTHER): Payer: Self-pay | Admitting: Ophthalmology

## 2023-03-15 DIAGNOSIS — Z7984 Long term (current) use of oral hypoglycemic drugs: Secondary | ICD-10-CM | POA: Diagnosis not present

## 2023-03-15 DIAGNOSIS — E113513 Type 2 diabetes mellitus with proliferative diabetic retinopathy with macular edema, bilateral: Secondary | ICD-10-CM

## 2023-03-15 DIAGNOSIS — H35033 Hypertensive retinopathy, bilateral: Secondary | ICD-10-CM

## 2023-03-15 DIAGNOSIS — I10 Essential (primary) hypertension: Secondary | ICD-10-CM

## 2023-03-15 DIAGNOSIS — H25813 Combined forms of age-related cataract, bilateral: Secondary | ICD-10-CM | POA: Diagnosis not present

## 2023-03-15 MED ORDER — PREDNISOLONE ACETATE 1 % OP SUSP: 1.0000 [drp] | Freq: Four times a day (QID) | OPHTHALMIC | 0 refills | Status: AC

## 2023-03-28 ENCOUNTER — Other Ambulatory Visit: Payer: Self-pay | Admitting: Internal Medicine

## 2023-03-28 DIAGNOSIS — M059 Rheumatoid arthritis with rheumatoid factor, unspecified: Secondary | ICD-10-CM

## 2023-03-28 NOTE — Telephone Encounter (Signed)
Last Fill: 02/28/2023  Labs: 03/10/2023 Hemoglobin 11.0 HCT 33.6 RDW 15.1 Glucose 153  Next Visit: 06/10/2023  Last Visit: 03/10/2023  DX: Seropositive rheumatoid arthritis   Current Dose per office note 03/10/2023: methotrexate 20 mg p.o. weekly   Okay to refill Methotrexate?

## 2023-03-28 NOTE — Progress Notes (Signed)
Sedimentation rate remains just 1 point outside of normal but slightly less than where it has been on the past few months.  Blood count with a very slightly low hemoglobin 11.0 also stable with her previous.  Kidney and liver function test are normal.  No problem for continuing current methotrexate.

## 2023-03-30 NOTE — Progress Notes (Signed)
[facility] - Clinic Note  04/12/2023     CHIEF COMPLAINT Patient presents for Retina Follow Up   HISTORY OF PRESENT ILLNESS: Brooke Sawyer is a 62 y.o. female who presents to the clinic today for:   HPI     Retina Follow Up   Patient presents with  Diabetic Retinopathy.  In both eyes.  This started 4 weeks ago.  I, the attending physician,  performed the HPI with the patient and updated documentation appropriately.        Comments   Patient here for 4 weeks retina follow up for PDR OU. Patient states vision doing good. No eye pain.       Last edited by Rennis Chris, MD on 04/12/2023 12:28 PM.    Patient feels the vision is the same.   Referring physician: Daisy Lazar, DO [address] [place],  [place] [address]  HISTORICAL INFORMATION:   Selected notes from the MEDICAL RECORD NUMBER    CURRENT MEDICATIONS: No current outpatient medications on file. (Ophthalmic Drugs)   No current facility-administered medications for this visit. (Ophthalmic Drugs)   Current Outpatient Medications (Other)  Medication Sig   atorvastatin (LIPITOR) 20 MG tablet TAKE 1 TABLET BY MOUTH EVERYDAY AT BEDTIME   Cholecalciferol (VITAMIN D) 50 MCG (2000 UT) tablet Take 2,000 Units by mouth daily.   Cyanocobalamin (B-12 PO) Take 1,000 mcg by mouth daily.   folic acid (FOLVITE) 1 MG tablet TAKE 1 TABLET BY MOUTH EVERY DAY   glipiZIDE (GLUCOTROL) 10 MG tablet Take 10 mg by mouth 2 (two) times daily.   hydroxychloroquine (PLAQUENIL) 200 MG tablet TAKE 1 TABLET BY MOUTH EVERY DAY   JARDIANCE 25 MG TABS tablet Take 25 mg by mouth every morning.   levocetirizine (XYZAL) 5 MG tablet Take 5 mg by mouth daily as needed for allergies.   losartan (COZAAR) 25 MG tablet Take 25 mg by mouth daily.   metFORMIN (GLUCOPHAGE) 500 MG tablet TAKE 2 TABLETS BY MOUTH TWICE A DAY WITH MORNING AND EVENING MEALS (Patient taking differently: Take 500-1,000 mg by mouth See admin instructions.  Take 1000 mg in the morning and 500 mg in the evening)   methotrexate (RHEUMATREX) 2.5 MG tablet TAKE 8 TABLETS (20 MG TOTAL) BY MOUTH ONCE A WEEK. CAUTION:CHEMOTHERAPY. PROTECT FROM LIGHT.   Omega-3 Fatty Acids (FISH OIL OMEGA-3) 1000 MG CAPS Take 1,000 mg by mouth daily.   No current facility-administered medications for this visit. (Other)   REVIEW OF SYSTEMS: ROS   Positive for: Endocrine, Eyes Negative for: Constitutional, Gastrointestinal, Neurological, Skin, Genitourinary, Musculoskeletal, HENT, Cardiovascular, Respiratory, Psychiatric, Allergic/Imm, Heme/Lymph Last edited by Laddie Aquas, COA on 04/12/2023  9:48 AM.      ALLERGIES No Known Allergies  PAST MEDICAL HISTORY Past Medical History:  Diagnosis Date   Diabetes mellitus without complication (HCC)    Rheumatoid arthritis (HCC)    Rotator cuff tear    Past Surgical History:  Procedure Laterality Date   COLONOSCOPY N/A 09/27/2012   Procedure: COLONOSCOPY;  Surgeon: Malissa Hippo, MD;  [facility]: [facility];  Service: Endoscopy;  Laterality: N/A;  1030   COLONOSCOPY WITH PROPOFOL N/A 12/01/2022   Procedure: COLONOSCOPY WITH PROPOFOL;  Surgeon: Dolores Frame, MD;  Location: [facility];  Service: Gastroenterology;  Laterality: N/A;  11:45AM;ASA 1-2   Dilation and Currettage     POLYPECTOMY  12/01/2022   Procedure: POLYPECTOMY;  Surgeon: Dolores Frame, MD;  [facility]: [facility];  Service: Gastroenterology;;   TUBAL LIGATION     FAMILY HISTORY Family History  Problem Relation Age of Onset   Diabetes Mother    Heart disease Mother    Diabetes Father    COPD Sister    Hypertension Brother    Graves' disease Daughter    Colon cancer Neg Hx    SOCIAL HISTORY Social History   Tobacco Use   Smoking status: Former    Current packs/day: 0.00    Average packs/day: 0.5 packs/day for 6.0 years (3.0 ttl pk-yrs)    Types: Cigarettes    Start date: 33    Quit date: 50    Years  since quitting: 32.7    Passive exposure: Never   Smokeless tobacco: Never  Vaping Use   Vaping status: Never Used  Substance Use Topics   Alcohol use: No   Drug use: No       OPHTHALMIC EXAM:   Base Eye Exam     Visual Acuity (Snellen - Linear)       Right Left   Dist [place] 20/20 20/20         Tonometry (Tonopen, 9:47 AM)       Right Left   Pressure 14 12         Pupils       Dark Light Shape React APD   Right 3 2 Round Brisk None   Left 3 2 Round Brisk None         Visual Fields (Counting fingers)       Left Right    Full Full         Extraocular Movement       Right Left    Full, Ortho Full, Ortho         Neuro/Psych     Oriented x3: Yes   Mood/Affect: Normal         Dilation     Both eyes: 1.0% Mydriacyl, 2.5% Phenylephrine @ 9:47 AM           Slit Lamp and Fundus Exam     Slit Lamp Exam       Right Left   Lids/Lashes Normal Normal   Conjunctiva/Sclera mild melanosis Trace Melanosis   Cornea trace Arcus, trace Punctate epithelial erosions trace Arcus, trace Punctate epithelial erosions   Anterior Chamber Deep and quiet Deep and quiet   Iris Round and dilated, No NVI Round and dilated, No NVI   Lens 2+ Nuclear sclerosis, 2+ Cortical cataract, early brunescense 2+ Nuclear sclerosis, 2+ Cortical cataract mild brunescense   Anterior Vitreous trace Vitreous syneresis trace Vitreous syneresis         Fundus Exam       Right Left   Disc Pink and Sharp, no NVD Pink and Sharp, +fine NVD and early fibrosis nasal disc -- regressed   C/D Ratio 0.3 0.4   Macula Flat, Good foveal reflex, scattered MA, no edema Flat, Good foveal reflex, scattered Microaneurysms/DBH   Vessels attenuated, Tortuous, copper wiring, early NV attenuated, Tortuous, copper wiring, +NV -- regressing   Periphery Attached, rare MA, early focal NV -- scattered Attached, scattered MA/DBH, focal patches of fibrosis and NV, good 360 PRP and posterior laser fill in             IMAGING AND PROCEDURES  Imaging and Procedures for @TODAY @  OCT, Retina - OU - Both Eyes       Right Eye Quality was good. Central Foveal Thickness: 291. Progression  has improved. Findings include normal foveal contour, no SRF, intraretinal fluid (Mild focal IRF/cystic changes temporal macula -- slightly improved).   Left Eye Quality was good. Central Foveal Thickness: 310. Progression has been stable. Findings include normal foveal contour, no SRF, intraretinal hyper-reflective material, intraretinal fluid, vitreomacular adhesion (Trace cystic changes temporal macula; partial PVD).   Notes *Images captured and stored on drive  Diagnosis / Impression:  NFP, no SRF, +IRF OU OD: Mild focal IRF/cystic changes temporal macula -- slightly improved [id number] cystic changes temporal macula, partial PVD  Clinical management:  See below  Abbreviations: NFP - Normal foveal profile. CME - cystoid macular edema. PED - pigment epithelial detachment. IRF - intraretinal fluid. SRF - subretinal fluid. EZ - ellipsoid zone. ERM - epiretinal membrane. ORA - outer retinal atrophy. ORT - outer retinal tubulation. SRHM - subretinal hyper-reflective material            ASSESSMENT/PLAN:    ICD-10-CM   1. Proliferative diabetic retinopathy of both eyes with macular edema associated with type 2 diabetes mellitus (HCC)  E11.3513 OCT, Retina - OU - Both Eyes    2. Long term (current) use of oral hypoglycemic drugs  Z79.84     3. Essential hypertension  I10     4. Hypertensive retinopathy of both eyes  H35.033     5. Combined forms of age-related cataract of both eyes  H25.813       1,2. Proliferative diabetic retinopathy w/ DME OU  - Pt lost to f/u after initial visit in 2020  - last A1c 7.9 on 01.25.23 - FA in 2020 showed NVE OS -- pt was scheduled for PRP OS but never made it back - repeat FA (08.07.24) shows OD: Mild NV greatest IT macula and inferior midzone; OS: Scattered  NV 360 midzone, new leakage from disc (mild NVD) -- pt would benefit from PRP OU - exam shows 360 MA OU and now NV OU - s/p PRP OS (08.13.24) - f/u Friday, September 13th, for PRP OD  3,4. Hypertensive retinopathy OU - discussed importance of tight BP control - monitor  5. Mixed form age related cataracts OU  - The symptoms of cataract, surgical options, and treatments and risks were discussed with patient. - discussed diagnosis and progression - monitor   Ophthalmic Meds Ordered this visit:  No orders of the defined types were placed in this encounter.    Return in about 3 days (around 04/15/2023) for f/u PDR OU, DFE, OCT, Laser PRP OD.  There are no Patient Instructions on file for this visit.   Explained the diagnoses, plan, and follow up with the patient and they expressed understanding.  Patient expressed understanding of the importance of proper follow up care.   This document serves as a record of services personally performed by Karie Chimera, MD, PhD. It was created on their behalf by Laurey Morale, COT an ophthalmic technician. The creation of this record is the provider's dictation and/or activities during the visit.    Electronically signed by:  Charlette Caffey, COT  04/12/23 12:29 PM  This document serves as a record of services personally performed by Karie Chimera, MD, PhD. It was created on their behalf by Glee Arvin. Manson Passey, OA an ophthalmic technician. The creation of this record is the provider's dictation and/or activities during the visit.    Electronically signed by: Glee Arvin. Manson Passey, OA 04/12/23 12:29 PM  Karie Chimera, M.D., Ph.D. Diseases & Surgery of the Retina and Vitreous  [facility]  I have reviewed the above documentation for accuracy and completeness, and I agree with the above. Karie Chimera, M.D., Ph.D. 04/12/23 12:30 PM  Abbreviations: M myopia (nearsighted); A astigmatism; H hyperopia (farsighted); P  presbyopia; Mrx spectacle prescription;  CTL contact lenses; OD right eye; OS left eye; OU both eyes  XT exotropia; ET esotropia; PEK punctate epithelial keratitis; PEE punctate epithelial erosions; DES dry eye syndrome; MGD meibomian gland dysfunction; ATs artificial tears; PFAT's preservative free artificial tears; NSC nuclear sclerotic cataract; PSC posterior subcapsular cataract; ERM epi-retinal membrane; PVD posterior vitreous detachment; RD retinal detachment; DM diabetes mellitus; DR diabetic retinopathy; NPDR non-proliferative diabetic retinopathy; PDR proliferative diabetic retinopathy; CSME clinically significant macular edema; DME diabetic macular edema; dbh dot blot hemorrhages; CWS cotton wool spot; POAG primary open angle glaucoma; C/D cup-to-disc ratio; HVF humphrey visual field; GVF goldmann visual field; OCT optical coherence tomography; IOP intraocular pressure; BRVO Branch retinal vein occlusion; CRVO central retinal vein occlusion; CRAO central retinal artery occlusion; BRAO branch retinal artery occlusion; RT retinal tear; SB scleral buckle; PPV pars plana vitrectomy; VH Vitreous hemorrhage; PRP panretinal laser photocoagulation; IVK intravitreal kenalog; VMT vitreomacular traction; MH Macular hole;  NVD neovascularization of the disc; NVE neovascularization elsewhere; AREDS age related eye disease study; ARMD age related macular degeneration; POAG primary open angle glaucoma; EBMD epithelial/anterior basement membrane dystrophy; ACIOL anterior chamber intraocular lens; IOL intraocular lens; PCIOL posterior chamber intraocular lens; Phaco/IOL phacoemulsification with intraocular lens placement; PRK photorefractive keratectomy; LASIK laser assisted in situ keratomileusis; HTN hypertension; DM diabetes mellitus; COPD chronic obstructive pulmonary disease

## 2023-04-12 ENCOUNTER — Ambulatory Visit (INDEPENDENT_AMBULATORY_CARE_PROVIDER_SITE_OTHER): Payer: 59 | Admitting: Ophthalmology

## 2023-04-12 ENCOUNTER — Encounter (INDEPENDENT_AMBULATORY_CARE_PROVIDER_SITE_OTHER): Payer: Self-pay | Admitting: Ophthalmology

## 2023-04-12 DIAGNOSIS — H25813 Combined forms of age-related cataract, bilateral: Secondary | ICD-10-CM

## 2023-04-12 DIAGNOSIS — E113513 Type 2 diabetes mellitus with proliferative diabetic retinopathy with macular edema, bilateral: Secondary | ICD-10-CM

## 2023-04-12 DIAGNOSIS — H35033 Hypertensive retinopathy, bilateral: Secondary | ICD-10-CM | POA: Diagnosis not present

## 2023-04-12 DIAGNOSIS — I10 Essential (primary) hypertension: Secondary | ICD-10-CM | POA: Diagnosis not present

## 2023-04-12 DIAGNOSIS — Z7984 Long term (current) use of oral hypoglycemic drugs: Secondary | ICD-10-CM

## 2023-04-13 ENCOUNTER — Other Ambulatory Visit: Payer: Self-pay | Admitting: Internal Medicine

## 2023-04-13 DIAGNOSIS — M059 Rheumatoid arthritis with rheumatoid factor, unspecified: Secondary | ICD-10-CM

## 2023-04-13 NOTE — Progress Notes (Signed)
[facility] - Clinic Note  04/15/2023     CHIEF COMPLAINT Patient presents for Retina Follow Up   HISTORY OF PRESENT ILLNESS: Brooke Sawyer is a 62 y.o. female who presents to the clinic today for:   HPI     Retina Follow Up   Patient presents with  Diabetic Retinopathy.  In both eyes.  This started 3 days ago.  Duration of 3 days.  Since onset it is stable.  I, the attending physician,  performed the HPI with the patient and updated documentation appropriately.        Comments   3 day retina follow up PDR OU and PRP OD  pt is reporting no vision changes noticed she denies any flashes or floaters pt last reading was 140 this am       Last edited by Rennis Chris, MD on 04/15/2023 12:00 PM.    Here for PRP OD today  Referring physician: Daisy Lazar, DO [address] [place],  [place] [address]  HISTORICAL INFORMATION:   Selected notes from the MEDICAL RECORD NUMBER    CURRENT MEDICATIONS: No current outpatient medications on file. (Ophthalmic Drugs)   No current facility-administered medications for this visit. (Ophthalmic Drugs)   Current Outpatient Medications (Other)  Medication Sig   atorvastatin (LIPITOR) 20 MG tablet TAKE 1 TABLET BY MOUTH EVERYDAY AT BEDTIME   Cholecalciferol (VITAMIN D) 50 MCG (2000 UT) tablet Take 2,000 Units by mouth daily.   Cyanocobalamin (B-12 PO) Take 1,000 mcg by mouth daily.   folic acid (FOLVITE) 1 MG tablet TAKE 1 TABLET BY MOUTH EVERY DAY   glipiZIDE (GLUCOTROL) 10 MG tablet Take 10 mg by mouth 2 (two) times daily.   hydroxychloroquine (PLAQUENIL) 200 MG tablet TAKE 1 TABLET BY MOUTH EVERY DAY (MAX OF 30 DAYS ON INSURANCE)   JARDIANCE 25 MG TABS tablet Take 25 mg by mouth every morning.   levocetirizine (XYZAL) 5 MG tablet Take 5 mg by mouth daily as needed for allergies.   losartan (COZAAR) 25 MG tablet Take 25 mg by mouth daily.   metFORMIN (GLUCOPHAGE) 500 MG tablet TAKE 2 TABLETS BY MOUTH TWICE A DAY WITH  MORNING AND EVENING MEALS (Patient taking differently: Take 500-1,000 mg by mouth See admin instructions. Take 1000 mg in the morning and 500 mg in the evening)   methotrexate (RHEUMATREX) 2.5 MG tablet TAKE 8 TABLETS (20 MG TOTAL) BY MOUTH ONCE A WEEK. CAUTION:CHEMOTHERAPY. PROTECT FROM LIGHT.   Omega-3 Fatty Acids (FISH OIL OMEGA-3) 1000 MG CAPS Take 1,000 mg by mouth daily.   No current facility-administered medications for this visit. (Other)   REVIEW OF SYSTEMS: ROS   Positive for: Endocrine, Eyes Negative for: Constitutional, Gastrointestinal, Neurological, Skin, Genitourinary, Musculoskeletal, HENT, Cardiovascular, Respiratory, Psychiatric, Allergic/Imm, Heme/Lymph Last edited by Etheleen Mayhew, COT on 04/15/2023  9:43 AM.     ALLERGIES No Known Allergies  PAST MEDICAL HISTORY Past Medical History:  Diagnosis Date   Diabetes mellitus without complication (HCC)    Rheumatoid arthritis (HCC)    Rotator cuff tear    Past Surgical History:  Procedure Laterality Date   COLONOSCOPY N/A 09/27/2012   Procedure: COLONOSCOPY;  Surgeon: Malissa Hippo, MD;  [facility]: [facility];  Service: Endoscopy;  Laterality: N/A;  1030   COLONOSCOPY WITH PROPOFOL N/A 12/01/2022   Procedure: COLONOSCOPY WITH PROPOFOL;  Surgeon: Dolores Frame, MD;  [facility]: [facility];  Service: Gastroenterology;  Laterality: N/A;  11:45AM;ASA 1-2  Dilation and Currettage     POLYPECTOMY  12/01/2022   Procedure: POLYPECTOMY;  Surgeon: Marguerita Merles, Reuel Boom, MD;  Location: [facility];  Service: Gastroenterology;;   TUBAL LIGATION     FAMILY HISTORY Family History  Problem Relation Age of Onset   Diabetes Mother    Heart disease Mother    Diabetes Father    COPD Sister    Hypertension Brother    Graves' disease Daughter    Colon cancer Neg Hx    SOCIAL HISTORY Social History   Tobacco Use   Smoking status: Former    Current packs/day: 0.00    Average packs/day: 0.5  packs/day for 6.0 years (3.0 ttl pk-yrs)    Types: Cigarettes    Start date: 37    Quit date: 68    Years since quitting: 32.7    Passive exposure: Never   Smokeless tobacco: Never  Vaping Use   Vaping status: Never Used  Substance Use Topics   Alcohol use: No   Drug use: No       OPHTHALMIC EXAM:   Base Eye Exam     Visual Acuity (Snellen - Linear)       Right Left   Dist [place] 20/20 20/20         Tonometry (Tonopen, 9:45 AM)       Right Left   Pressure 16 17         Pupils       Pupils Dark Light Shape React APD   Right PERRL 3 2 Round Brisk None   Left PERRL 3 2 Round Brisk None         Visual Fields       Left Right    Full Full         Extraocular Movement       Right Left    Full, Ortho Full, Ortho         Neuro/Psych     Oriented x3: Yes   Mood/Affect: Normal         Dilation     Right eye: 2.5% Phenylephrine @ 9:44 AM           Slit Lamp and Fundus Exam     Slit Lamp Exam       Right Left   Lids/Lashes Normal Normal   Conjunctiva/Sclera mild melanosis Trace Melanosis   Cornea trace Arcus, trace Punctate epithelial erosions trace Arcus, trace Punctate epithelial erosions   Anterior Chamber Deep and quiet Deep and quiet   Iris Round and dilated, No NVI Round and dilated, No NVI   Lens 2+ Nuclear sclerosis, 2+ Cortical cataract, early brunescense 2+ Nuclear sclerosis, 2+ Cortical cataract mild brunescense   Anterior Vitreous trace Vitreous syneresis trace Vitreous syneresis         Fundus Exam       Right Left   Disc Pink and Sharp, no NVD Pink and Sharp, +fine NVD and early fibrosis nasal disc -- regressed   C/D Ratio 0.3 0.4   Macula Flat, Good foveal reflex, scattered MA, no edema Flat, Good foveal reflex, scattered Microaneurysms/DBH   Vessels attenuated, Tortuous, copper wiring, early NV attenuated, Tortuous, copper wiring, +NV -- regressing   Periphery Attached, rare MA, early focal NV -- scattered  Attached, scattered MA/DBH, focal patches of fibrosis and NV, good 360 PRP and posterior laser fill in            IMAGING AND PROCEDURES  Imaging and Procedures for @  TODAY@  OCT, Retina - OU - Both Eyes       Right Eye Quality was good. Central Foveal Thickness: 293. Progression has improved. Findings include normal foveal contour, no SRF, intraretinal fluid (Mild focal IRF/cystic changes temporal macula -- slightly improved).   Left Eye Quality was good. Central Foveal Thickness: 308. Progression has been stable. Findings include normal foveal contour, no SRF, intraretinal hyper-reflective material, intraretinal fluid, vitreomacular adhesion (Trace cystic changes temporal macula; partial PVD).   Notes *Images captured and stored on drive  Diagnosis / Impression:  NFP, no SRF, +IRF OU OD: Mild focal IRF/cystic changes temporal macula -- slightly improved [id number] cystic changes temporal macula, partial PVD  Clinical management:  See below  Abbreviations: NFP - Normal foveal profile. CME - cystoid macular edema. PED - pigment epithelial detachment. IRF - intraretinal fluid. SRF - subretinal fluid. EZ - ellipsoid zone. ERM - epiretinal membrane. ORA - outer retinal atrophy. ORT - outer retinal tubulation. SRHM - subretinal hyper-reflective material      Panretinal Photocoagulation - OD - Right Eye       LASER PROCEDURE NOTE  Diagnosis:   Proliferative Diabetic Retinopathy, RIGHT EYE  Procedure:  Pan-retinal photocoagulation using slit lamp laser, RIGHT EYE  Anesthesia:  Topical  Surgeon: Rennis Chris, MD, PhD   Informed consent obtained, operative eye marked, and time out performed prior to initiation of laser.   Lumenis [id number] slit lamp laser Pattern: 3x3 square Power: 270 mW Duration: 30 msec  Spot size: 200 microns  # spots: 2208 spots  Complications: None.  RTC: 3 mos - DFE/OCT, possible repeat FA  Patient tolerated the procedure well and received  written and verbal post-procedure care information/education.            ASSESSMENT/PLAN:    ICD-10-CM   1. Proliferative diabetic retinopathy of both eyes with macular edema associated with type 2 diabetes mellitus (HCC)  E11.3513 OCT, Retina - OU - Both Eyes    Panretinal Photocoagulation - OD - Right Eye    2. Long term (current) use of oral hypoglycemic drugs  Z79.84     3. Essential hypertension  I10     4. Hypertensive retinopathy of both eyes  H35.033     5. Combined forms of age-related cataract of both eyes  H25.813      1,2. Proliferative diabetic retinopathy w/ DME OU  - Pt lost to f/u after initial visit in 2020  - last A1c 7.9 on 01.25.23 - FA in 2020 showed NVE OS -- pt was scheduled for PRP OS but never made it back - repeat FA (08.07.24) shows OD: Mild NV greatest IT macula and inferior midzone; OS: Scattered NV 360 midzone, new leakage from disc (mild NVD) -- pt would benefit from PRP OU - exam shows 360 MA OU and now NV OU - s/p PRP OS (08.13.24) - recommend PRP OD today, 09.13.24 - pt wishes to proceed with laser - RBA of procedure discussed, questions answered - informed consent obtained and signed - see procedure note - start PF QID OD x7 days - f/u 3 months, DFE, OCT, possible repeat FA  3,4. Hypertensive retinopathy OU - discussed importance of tight BP control - monitor  5. Mixed form age related cataracts OU  - The symptoms of cataract, surgical options, and treatments and risks were discussed with patient. - discussed diagnosis and progression - monitor   Ophthalmic Meds Ordered this visit:  No orders of the defined types were placed in  this encounter.    Return in about 3 months (around 07/15/2023) for f/u PDR OU, DFE, OCT.  There are no Patient Instructions on file for this visit.  This document serves as a record of services personally performed by Karie Chimera, MD, PhD. It was created on their behalf by Berlin Hun COT,  an ophthalmic technician. The creation of this record is the provider's dictation and/or activities during the visit.    Electronically signed by: Berlin Hun COT 09.11.24 12:09 PM  Karie Chimera, M.D., Ph.D. Diseases & Surgery of the Retina and Vitreous [facility] 04/15/2023   I have reviewed the above documentation for accuracy and completeness, and I agree with the above. Karie Chimera, M.D., Ph.D. 04/15/23 12:12 PM   Abbreviations: M myopia (nearsighted); A astigmatism; H hyperopia (farsighted); P presbyopia; Mrx spectacle prescription;  CTL contact lenses; OD right eye; OS left eye; OU both eyes  XT exotropia; ET esotropia; PEK punctate epithelial keratitis; PEE punctate epithelial erosions; DES dry eye syndrome; MGD meibomian gland dysfunction; ATs artificial tears; PFAT's preservative free artificial tears; NSC nuclear sclerotic cataract; PSC posterior subcapsular cataract; ERM epi-retinal membrane; PVD posterior vitreous detachment; RD retinal detachment; DM diabetes mellitus; DR diabetic retinopathy; NPDR non-proliferative diabetic retinopathy; PDR proliferative diabetic retinopathy; CSME clinically significant macular edema; DME diabetic macular edema; dbh dot blot hemorrhages; CWS cotton wool spot; POAG primary open angle glaucoma; C/D cup-to-disc ratio; HVF humphrey visual field; GVF goldmann visual field; OCT optical coherence tomography; IOP intraocular pressure; BRVO Branch retinal vein occlusion; CRVO central retinal vein occlusion; CRAO central retinal artery occlusion; BRAO branch retinal artery occlusion; RT retinal tear; SB scleral buckle; PPV pars plana vitrectomy; VH Vitreous hemorrhage; PRP panretinal laser photocoagulation; IVK intravitreal kenalog; VMT vitreomacular traction; MH Macular hole;  NVD neovascularization of the disc; NVE neovascularization elsewhere; AREDS age related eye disease study; ARMD age related macular degeneration; POAG  primary open angle glaucoma; EBMD epithelial/anterior basement membrane dystrophy; ACIOL anterior chamber intraocular lens; IOL intraocular lens; PCIOL posterior chamber intraocular lens; Phaco/IOL phacoemulsification with intraocular lens placement; PRK photorefractive keratectomy; LASIK laser assisted in situ keratomileusis; HTN hypertension; DM diabetes mellitus; COPD chronic obstructive pulmonary disease

## 2023-04-13 NOTE — Telephone Encounter (Signed)
Last Fill: 11/11/2022  Eye exam: 03/10/2023 WNL   Labs: 03/10/2023 Sedimentation rate remains just 1 point outside of normal but slightly less than where it has been on the past few months.  Blood count with a very slightly low hemoglobin 11.0 also stable with her previous.  Kidney and liver function test are normal.  No problem for continuing current methotrexate.   Next Visit: 06/10/2023  Last Visit: 03/10/2023  [id number] rheumatoid arthritis   Current Dose per office note 03/10/2023: hydroxychloroquine 200 mg daily   Okay to refill Plaquenil?

## 2023-04-15 ENCOUNTER — Ambulatory Visit (INDEPENDENT_AMBULATORY_CARE_PROVIDER_SITE_OTHER): Payer: 59 | Admitting: Ophthalmology

## 2023-04-15 ENCOUNTER — Encounter (INDEPENDENT_AMBULATORY_CARE_PROVIDER_SITE_OTHER): Payer: Self-pay | Admitting: Ophthalmology

## 2023-04-15 DIAGNOSIS — Z7984 Long term (current) use of oral hypoglycemic drugs: Secondary | ICD-10-CM

## 2023-04-15 DIAGNOSIS — H35033 Hypertensive retinopathy, bilateral: Secondary | ICD-10-CM | POA: Diagnosis not present

## 2023-04-15 DIAGNOSIS — E113513 Type 2 diabetes mellitus with proliferative diabetic retinopathy with macular edema, bilateral: Secondary | ICD-10-CM | POA: Diagnosis not present

## 2023-04-15 DIAGNOSIS — I10 Essential (primary) hypertension: Secondary | ICD-10-CM

## 2023-04-15 DIAGNOSIS — H25813 Combined forms of age-related cataract, bilateral: Secondary | ICD-10-CM

## 2023-05-21 IMAGING — CR DG CHEST 2V
2 series · 2 of 2 positions shown · non-contrast
Comparison: none

CLINICAL DATA: Pt is seropositive for RA, starting methotrexate
treatment. Baseline CXR. No symptoms.

EXAM:
CHEST - 2 VIEW

[w chest pa]
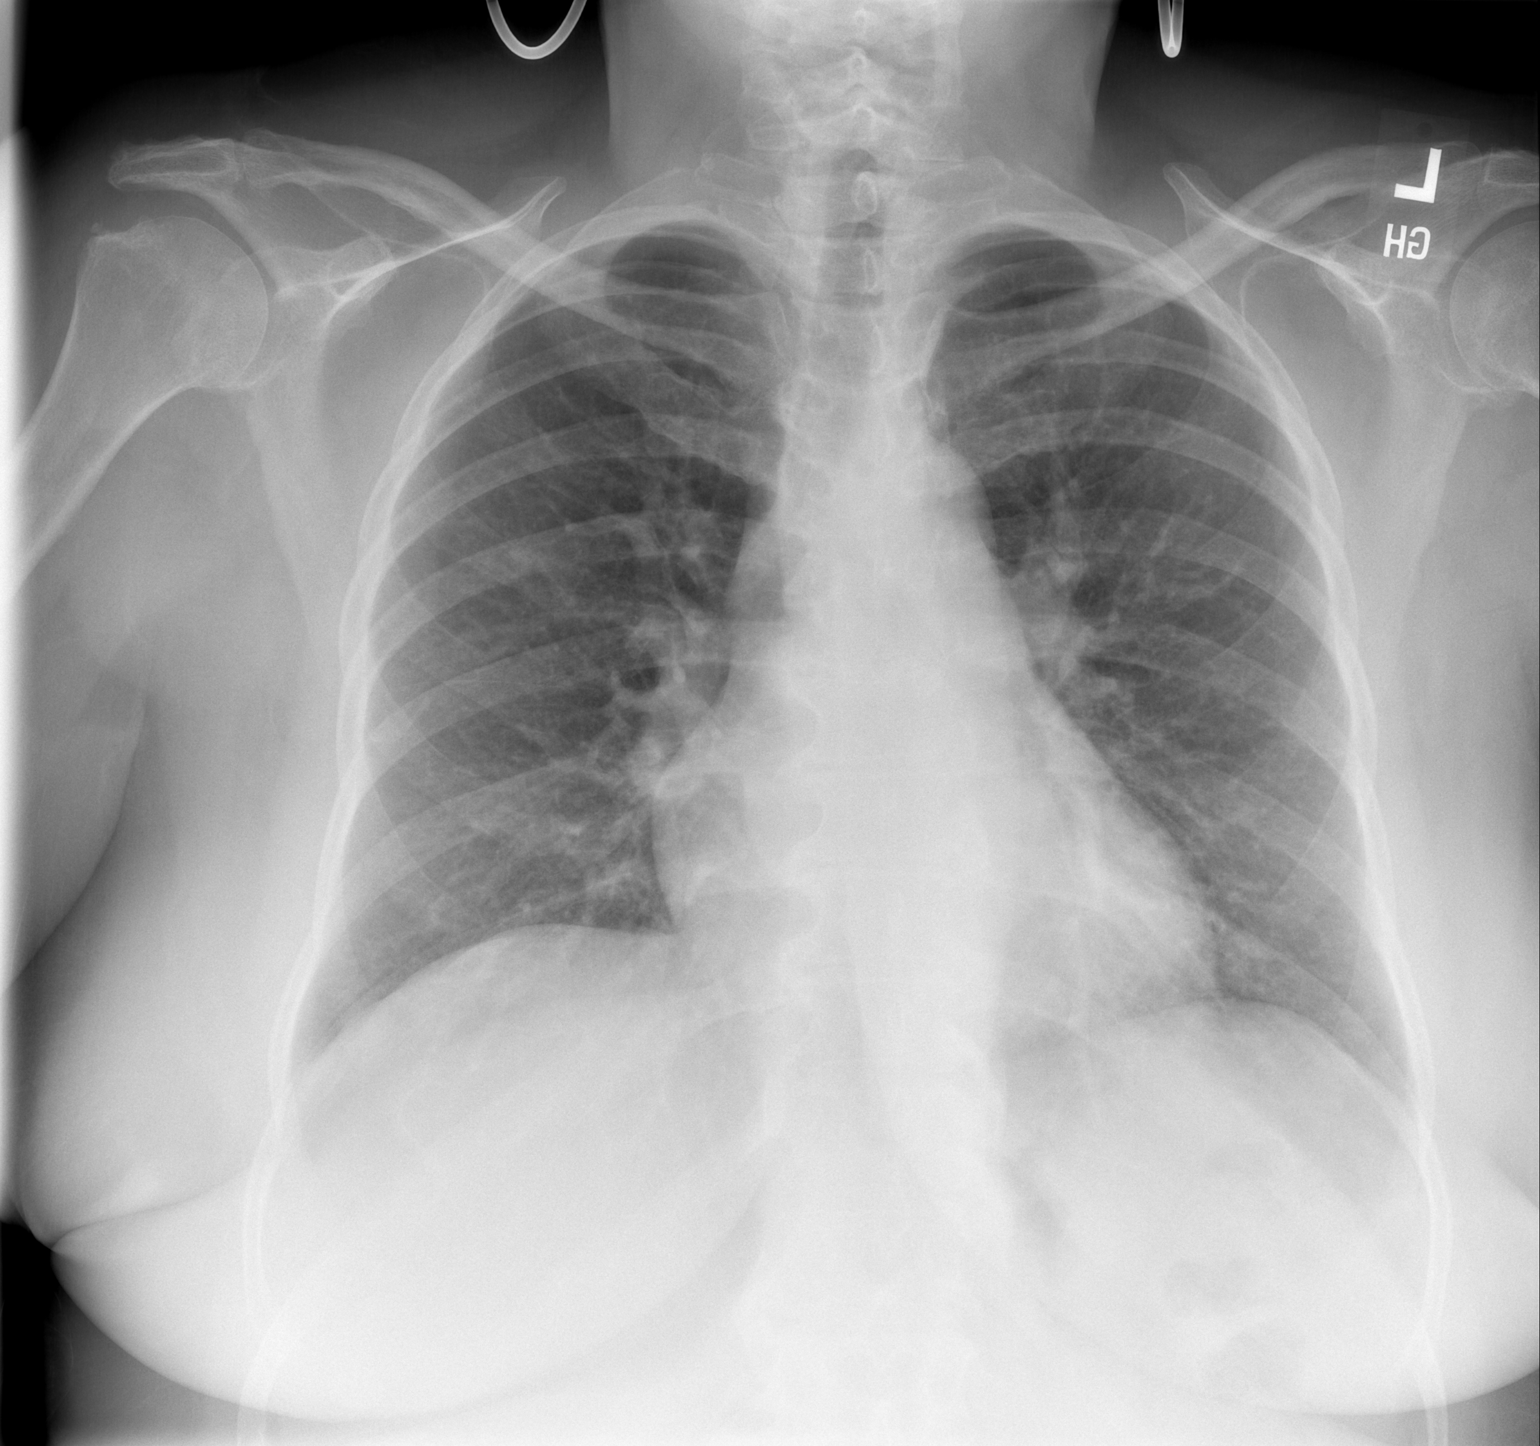

[w chest lat]
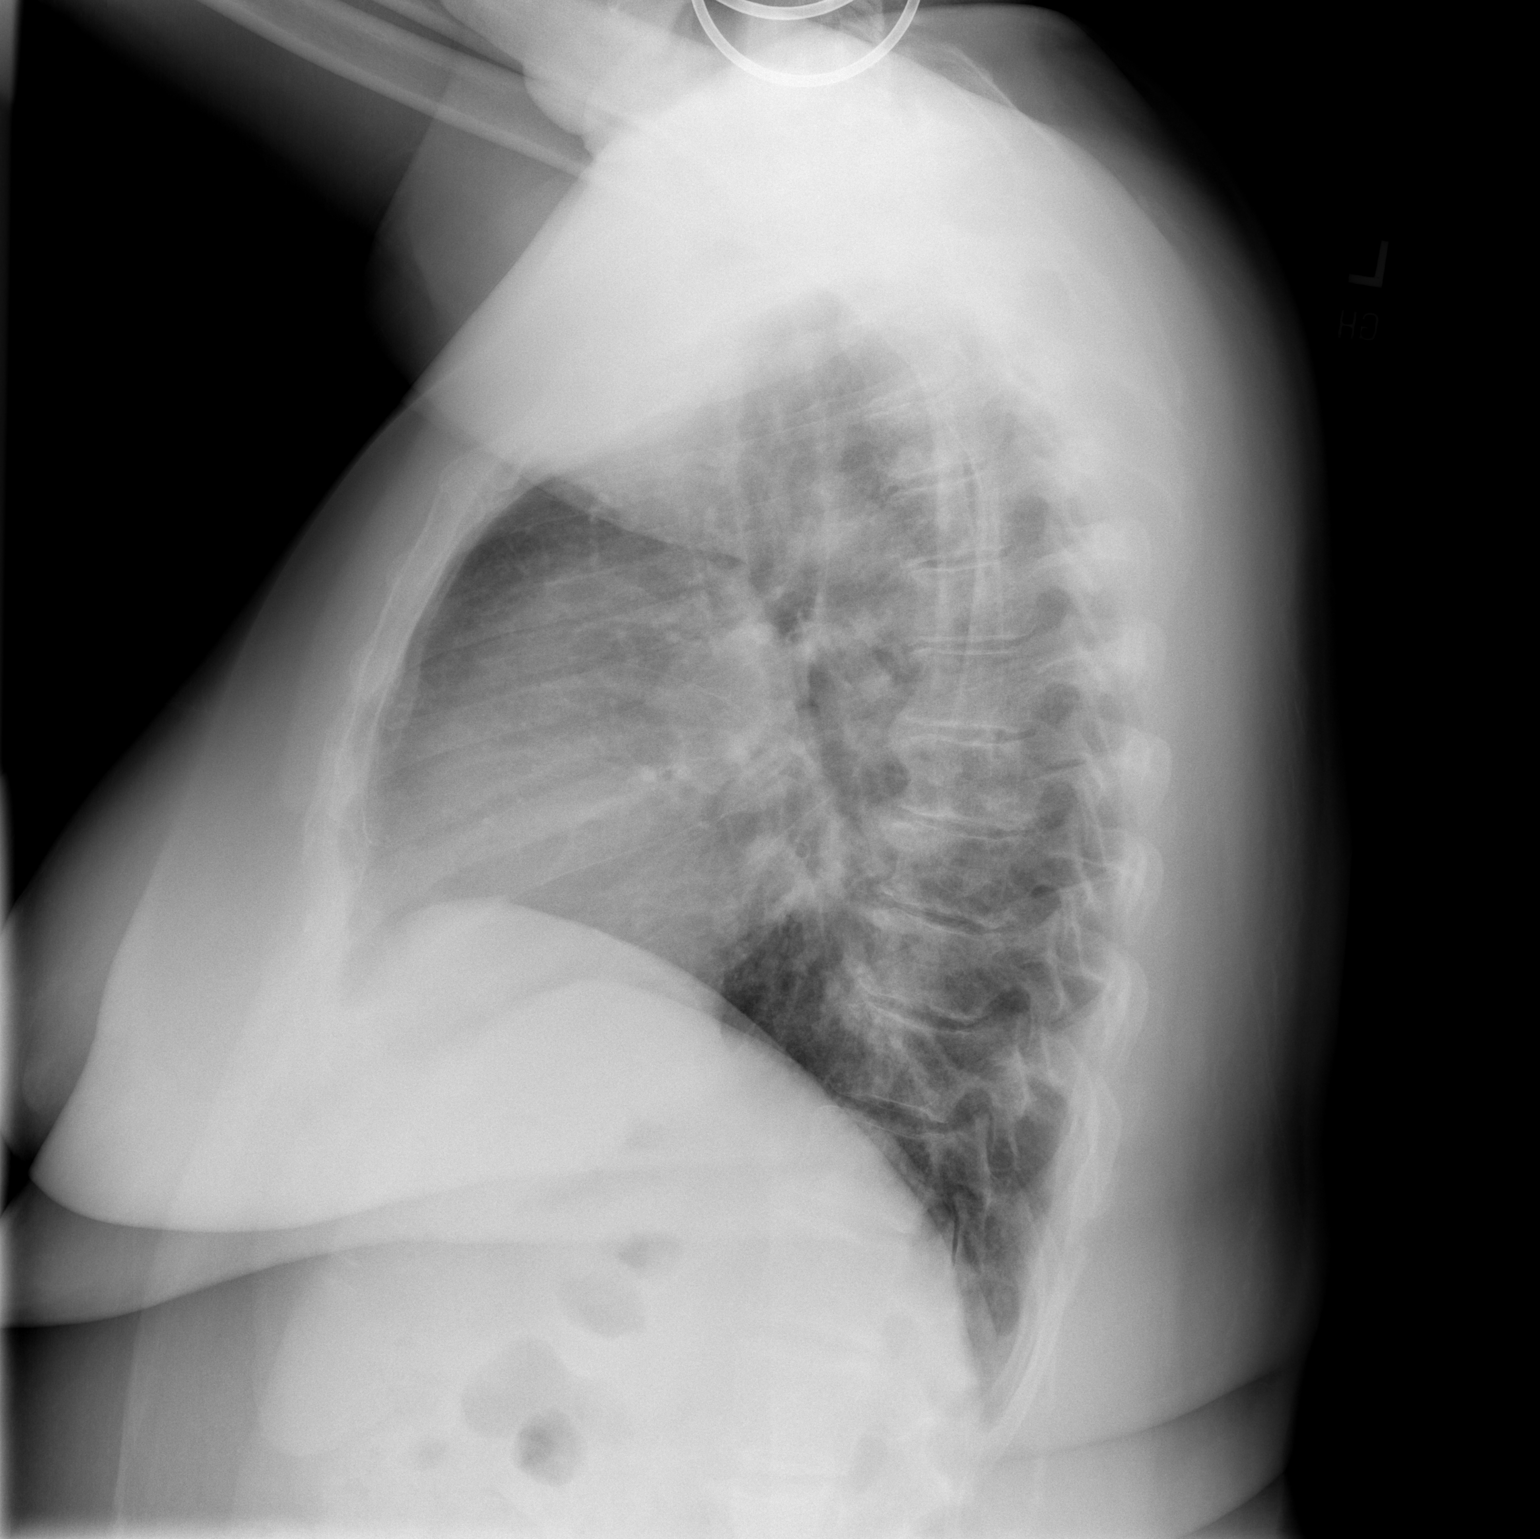

[2 of 2 positions shown; findings below may reference images not displayed]

FINDINGS: Lungs are clear.

Heart size and mediastinal contours are within normal limits.

No effusion.

Visualized bones unremarkable.
IMPRESSION: No acute cardiopulmonary disease.

## 2023-05-27 NOTE — Progress Notes (Signed)
Office Visit Note  Patient: Brooke Sawyer             Date of Birth: Oct 26, 1960           MRN: [id number]             PCP: Toma Deiters, MD Referring: Toma Deiters, MD Visit Date: 06/10/2023   Subjective:  Follow-up   History of Present Illness: Brooke Sawyer is a 62 y.o. female here for follow up for seropositive RA on methotrexate 20 mg p.o. weekly and hydroxychloroquine 200 mg daily folic acid 1 mg daily.  Lab results at previous visit with sedimentation rate 1 point above normal no particular disease activity.  Overall she is doing great with no arthritis flareups and does not have significant daily symptoms.  No prolonged morning stiffness.  She had additional follow-up with Dr. Vanessa Barbara in September due to proliferative diabetic retinopathy.  Previous HPI 03/10/2023 Brooke Sawyer is a 62 y.o. female here for follow up for seropositive RA on methotrexate 20 mg p.o. weekly and hydroxychloroquine 200 mg daily folic acid 1 mg daily.  She is doing well overall no significant disease flare but not noticing any visible joint swelling.  Does not have any prolonged morning stiffness.  She is working on keeping regular physical activity weight appears stable compared to visit earlier this year.  She missed her original appointment about 2 weeks ago so rescheduled to today.  Had ophthalmology exam looked fine for continuing Plaquenil.   Previous HPI 10/20/2022 Brooke Sawyer is a 62 y.o. female here for follow up for seropositive RA on methotrexate 20 mg p.o. weekly and hydroxychloroquine 200 mg daily folic acid 1 mg daily.  Overall arthritis symptoms are doing well since her last visit no new flareups.  Not having prolonged morning stiffness or significant joint pain during winter weather this year.  She has not had any significant infections since her last visit.  Blood work at previous visit did show mild elevation in sedimentation rate but no particular signs of disease activity.    Previous  HPI 06/30/22 Brooke Sawyer is a 62 y.o. female here for follow up for seropositive RA on MTX 20 mg PO weekly and hydroxychloroquine 200 mg daily and folic acid 1 mg daily.  Overall her arthritis symptoms are doing very well since her last visit.  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   Previous HPI 03/30/22 Brooke Sawyer is a 62 y.o. female here for follow up for seropositive RA on MTX 20 mg PO weekly and HCQ 200 mg daily. She feels symptoms are well controlled and no problems with medications. Discussed HCQ screening with her eye doctor at last visit apparently was not specifically addressing this before because it had not been discussed.   Previous HPI 12/16/2021 Brooke Sawyer is a 62 y.o. female here for follow up for seropositive RA on methotrexate 20 mg PO weekly and hydroxychloroquine 200 mg daily. Since our last visit she is doing well no increase in symptoms. No trouble with medications or other health events.   Previous HPI 09/15/2021 Brooke Sawyer is a 62 y.o. female here for follow up for seropositive RA on methotrexate 20 mg PO weekly and hydroxychloroquine 200 mg daily. She feels symptoms are very well controlled. No significant morning stiffness. No interval complications or infections.  Previous HPI 06/17/21 Brooke Sawyer is a 62 y.o. female here for follow up after initial visit for transfer of care for rheumatoid arthritis initially to continue treatment with methotrexate 20 mg PO weekly. Lab tests at initial visit showing ESR of 34 minimal elevation baseline CBC and CMP unremarkable. Since our last visit she feels symptoms are doing pretty well she finished taking the steroids uneventfully. She saw optometrist told her she needs to see the ophthalmologist, says the last visit in 2020 workup was extensive and had a patient cost of $500 so has not been regularly  following up with Dr. Vanessa Barbara recently.   Previous HPI 06/03/21 Brooke Sawyer is a 62 y.o. female here for seropositive, erosive RA previously managed at [facility] rheumatology. She was originally diagnosed in 2019 with symptoms of bilateral finger joints pain and swelling and decreased mobility. Rheumatology workup showing positive RF, CCP, and erosive disease on xray imaging and she started treatment with methotrexate and folic acid with initial 1 week prednisone taper. She has felt overall well controlled disease on this regimen. Some ongoing swelling led to adding hydroxychloroquine to methotrexate but had a hard time getting this due to COVID related acces issues so never stayed on it. She changed insurance due to her husband retiring on medicare benefits and unable to follow up with [facility] on new insurance. She is off methotrexate treatment for several months and has significant finger joint swelling, stiffness, and pain. She gets partial relief with NSAIDs which she currently uses to treated her finger pain and swelling. She broke her 4th and 5th does from a minor trauma with losing her balance. She has never suffered a major bone fracture. She takes daily vitamin D supplementation but reports never having bone density screening for osteoporosis.   Review of Systems  Constitutional:  Negative for fatigue.  HENT:  Negative for mouth sores and mouth dryness.   Eyes:  Negative for dryness.  Respiratory:  Negative for shortness of breath.   Cardiovascular:  Negative for chest pain and palpitations.  Gastrointestinal:  Negative for blood in stool, constipation and diarrhea.  Endocrine: Negative for increased urination.  Genitourinary:  Negative for involuntary urination.  Musculoskeletal:  Negative for joint pain, gait problem, joint pain, joint swelling, myalgias, muscle weakness, morning stiffness, muscle tenderness and myalgias.  Skin:  Negative for color change, rash, hair loss  and sensitivity to sunlight.  Allergic/Immunologic: Negative for susceptible to infections.  Neurological:  Negative for dizziness and headaches.  Hematological:  Negative for swollen glands.  Psychiatric/Behavioral:  Negative for depressed mood and sleep disturbance. The patient is not nervous/anxious.     PMFS History:  Patient Active Problem List   Diagnosis Date Noted   History of colonic polyps 12/01/2022   High risk medication use 06/03/2021   Proliferative diabetic retinopathy of left eye with macular edema associated with type 2 diabetes mellitus (HCC) 05/27/2021   Type 2 diabetes mellitus with diabetic retinopathy (HCC) 05/25/2021   Seropositive rheumatoid arthritis (HCC) 04/14/2018   Atypical squamous cell changes of undetermined significance (ASCUS) on vaginal cytology 08/23/2013   Partial nontraumatic tear of rotator cuff 10/18/2012   Rotator cuff syndrome of right shoulder 10/18/2012    Past Medical History:  Diagnosis Date   Diabetes mellitus without complication (HCC)    Rheumatoid arthritis (HCC)    Rotator cuff tear     Family History  Problem Relation Age of Onset   Diabetes Mother    Heart disease Mother  Diabetes Father    COPD Sister    Hypertension Brother    Luiz Blare' disease Daughter    Colon cancer Neg Hx    Past Surgical History:  Procedure Laterality Date   COLONOSCOPY N/A 09/27/2012   Procedure: COLONOSCOPY;  Surgeon: Malissa Hippo, MD;  Location: [facility];  Service: Endoscopy;  Laterality: N/A;  1030   COLONOSCOPY WITH PROPOFOL N/A 12/01/2022   Procedure: COLONOSCOPY WITH PROPOFOL;  Surgeon: Dolores Frame, MD;  Location: [facility];  Service: Gastroenterology;  Laterality: N/A;  11:45AM;ASA 1-2   Dilation and Currettage     POLYPECTOMY  12/01/2022   Procedure: POLYPECTOMY;  Surgeon: Marguerita Merles, Reuel Boom, MD;  Location: [facility];  Service: Gastroenterology;;   TUBAL LIGATION     Social History   Social History  Narrative   Not on file   Immunization History  Administered Date(s) Administered   Influenza Inj Mdck Quad Pf 05/12/2018, 04/21/2019   Influenza,inj,Quad PF,6+ Mos 05/07/2017   Influenza-Unspecified 05/11/2021   Moderna SARS-COV2 Booster Vaccination 08/11/2021   Moderna Sars-Covid-2 Vaccination 11/08/2019, 12/04/2019     Objective: Vital Signs: BP 110/67 (BP Location: Left Arm, Patient Position: Sitting, Cuff Size: Normal)   Pulse 65   Resp 14   Ht 5\' 4"  (1.626 m)   Wt 212 lb 12.8 oz (96.5 kg)   LMP 05/25/2016   BMI 36.53 kg/m    Physical Exam Constitutional:      Appearance: She is obese.  Eyes:     Conjunctiva/sclera: Conjunctivae normal.  Cardiovascular:     Rate and Rhythm: Normal rate and regular rhythm.  Pulmonary:     Effort: Pulmonary effort is normal.     Breath sounds: Normal breath sounds.  Lymphadenopathy:     Cervical: No cervical adenopathy.  Skin:    General: Skin is warm and dry.     Findings: No rash.  Neurological:     Mental Status: She is alert.  Psychiatric:        Mood and Affect: Mood normal.      Musculoskeletal Exam:  Shoulders full ROM no tenderness or swelling Elbows full ROM no tenderness or swelling Wrists full ROM no tenderness or swelling Fingers full ROM no tenderness or swelling Knees full ROM no tenderness or swelling Ankles full ROM no tenderness or swelling   Investigation: No additional findings.  Imaging: No results found.  Recent Labs: Lab Results  Component Value Date   WBC 5.9 06/10/2023   HGB 11.6 (L) 06/10/2023   PLT 238 06/10/2023   NA 140 03/10/2023   K 4.3 03/10/2023   CL 105 03/10/2023   CO2 25 03/10/2023   GLUCOSE 153 (H) 03/10/2023   BUN 13 03/10/2023   CREATININE 0.75 03/10/2023   BILITOT 0.3 03/10/2023   AST 15 03/10/2023   ALT 17 03/10/2023   PROT 7.1 03/10/2023   CALCIUM 8.8 03/10/2023   GFRAA  05/15/2008    >60        The eGFR has been calculated using the MDRD equation. This  calculation has not been validated in all clinical    Speciality Comments: PLQ Eye Exam: 03/10/2023 WNL @ [facility] Follow up 1 year  Procedures:  No procedures performed Allergies: Patient has no known allergies.   Assessment / Plan:     Visit Diagnoses: Seropositive rheumatoid arthritis (HCC) - Plan: Sedimentation rate  RA appears very well-controlled on her current regimen.  No exacerbations during the interval  despite slightly abnormal sedimentation rate.  Will recheck this today.  Plan to continue on methotrexate 20 mg p.o. weekly and folic acid 1 mg daily and hydroxychloroquine 200 mg daily.  High risk medication use - methotrexate 20 mg p.o. weekly folic acid 1 mg daily and hydroxychloroquine 200 mg daily. PLQ Eye Exam: 03/10/2023 WNL - Plan: CBC with Differential/Platelet, COMPLETE METABOLIC PANEL WITH GFR  Checking CBC and CMP for medication monitoring on continued long-term use of methotrexate and hydroxychloroquine.  Most recent Plaquenil eye exam from August was fine.  No serious interval infections.  Orders: Orders Placed This Encounter  Procedures   Sedimentation rate   CBC with Differential/Platelet   COMPLETE METABOLIC PANEL WITH GFR   No orders of the defined types were placed in this encounter.    Follow-Up Instructions: No follow-ups on file.   Fuller Plan, MD  Note - This record has been created using [employer].  Chart creation errors have been sought, but may not always  have been located. Such creation errors do not reflect on  the standard of medical care.

## 2023-06-10 ENCOUNTER — Encounter: Payer: Self-pay | Admitting: Internal Medicine

## 2023-06-10 ENCOUNTER — Ambulatory Visit: Payer: 59 | Attending: Internal Medicine | Admitting: Internal Medicine

## 2023-06-10 VITALS — BP 110/67 | HR 65 | Resp 14 | Ht 64.0 in | Wt 212.8 lb

## 2023-06-10 DIAGNOSIS — M059 Rheumatoid arthritis with rheumatoid factor, unspecified: Secondary | ICD-10-CM

## 2023-06-10 DIAGNOSIS — Z79899 Other long term (current) drug therapy: Secondary | ICD-10-CM | POA: Diagnosis not present

## 2023-06-10 MED ORDER — METHOTREXATE SODIUM 2.5 MG PO TABS: 20.0000 mg | ORAL_TABLET | ORAL | 0 refills | Status: DC

## 2023-06-11 LAB — CBC WITH DIFFERENTIAL/PLATELET
Absolute Lymphocytes: 1882 {cells}/uL (ref 850–3900)
Absolute Monocytes: 649 {cells}/uL (ref 200–950)
Basophils Absolute: 59 {cells}/uL (ref 0–200)
Basophils Relative: 1 %
Eosinophils Absolute: 342 {cells}/uL (ref 15–500)
Eosinophils Relative: 5.8 %
HCT: 36.4 % (ref 35.0–45.0)
Hemoglobin: 11.6 g/dL — ABNORMAL LOW (ref 11.7–15.5)
MCH: 28 pg (ref 27.0–33.0)
MCHC: 31.9 g/dL — ABNORMAL LOW (ref 32.0–36.0)
MCV: 87.9 fL (ref 80.0–100.0)
MPV: 11.7 fL (ref 7.5–12.5)
Monocytes Relative: 11 %
Neutro Abs: 2968 {cells}/uL (ref 1500–7800)
Neutrophils Relative %: 50.3 %
Platelets: 238 10*3/uL (ref 140–400)
RBC: 4.14 10*6/uL (ref 3.80–5.10)
RDW: 14.3 % (ref 11.0–15.0)
Total Lymphocyte: 31.9 %
WBC: 5.9 10*3/uL (ref 3.8–10.8)

## 2023-06-11 LAB — COMPLETE METABOLIC PANEL WITH GFR
AG Ratio: 1.4 (calc) (ref 1.0–2.5)
ALT: 14 U/L (ref 6–29)
AST: 13 U/L (ref 10–35)
Albumin: 4.3 g/dL (ref 3.6–5.1)
Alkaline phosphatase (APISO): 94 U/L (ref 37–153)
BUN: 13 mg/dL (ref 7–25)
CO2: 26 mmol/L (ref 20–32)
Calcium: 9.5 mg/dL (ref 8.6–10.4)
Chloride: 107 mmol/L (ref 98–110)
Creat: 0.71 mg/dL (ref 0.50–1.05)
Globulin: 3.1 g/dL (ref 1.9–3.7)
Glucose, Bld: 89 mg/dL (ref 65–99)
Potassium: 4.7 mmol/L (ref 3.5–5.3)
Sodium: 141 mmol/L (ref 135–146)
Total Bilirubin: 0.3 mg/dL (ref 0.2–1.2)
Total Protein: 7.4 g/dL (ref 6.1–8.1)
eGFR: 95 mL/min/{1.73_m2} (ref >=60)

## 2023-06-11 LAB — SEDIMENTATION RATE: Sed Rate: 31 mm/h — ABNORMAL HIGH (ref 0–30)

## 2023-06-15 DIAGNOSIS — E7849 Other hyperlipidemia: Secondary | ICD-10-CM | POA: Diagnosis not present

## 2023-06-15 DIAGNOSIS — I10 Essential (primary) hypertension: Secondary | ICD-10-CM | POA: Diagnosis not present

## 2023-06-15 DIAGNOSIS — M05632 Rheumatoid arthritis of left wrist with involvement of other organs and systems: Secondary | ICD-10-CM | POA: Diagnosis not present

## 2023-06-15 DIAGNOSIS — E1122 Type 2 diabetes mellitus with diabetic chronic kidney disease: Secondary | ICD-10-CM | POA: Diagnosis not present

## 2023-06-15 DIAGNOSIS — Z1159 Encounter for screening for other viral diseases: Secondary | ICD-10-CM | POA: Diagnosis not present

## 2023-06-15 DIAGNOSIS — E66813 Obesity, class 3: Secondary | ICD-10-CM | POA: Diagnosis not present

## 2023-06-15 DIAGNOSIS — Z6836 Body mass index (BMI) 36.0-36.9, adult: Secondary | ICD-10-CM | POA: Diagnosis not present

## 2023-06-22 ENCOUNTER — Other Ambulatory Visit (HOSPITAL_COMMUNITY): Payer: Self-pay | Admitting: Internal Medicine

## 2023-06-22 DIAGNOSIS — Z1231 Encounter for screening mammogram for malignant neoplasm of breast: Secondary | ICD-10-CM

## 2023-07-05 NOTE — Progress Notes (Signed)
[facility] - Clinic Note  07/19/2023     CHIEF COMPLAINT Patient presents for Retina Follow Up   HISTORY OF PRESENT ILLNESS: Brooke Sawyer is a 62 y.o. female who presents to the clinic today for:   HPI     Retina Follow Up   Patient presents with  Diabetic Retinopathy.  In both eyes.  This started 3 months ago.  Duration of 3 months.  Since onset it is stable.  I, the attending physician,  performed the HPI with the patient and updated documentation appropriately.        Comments   Patient states the vision is the same. She is not using eye drops. Her blood sugar was 130.      Last edited by Rennis Chris, MD on 07/21/2023  4:02 PM.    Patient feels the vision is doing well.  Referring physician: Daisy Lazar, DO [address] [place],  [place] [address]  HISTORICAL INFORMATION:   Selected notes from the MEDICAL RECORD NUMBER    CURRENT MEDICATIONS: No current outpatient medications on file. (Ophthalmic Drugs)   No current facility-administered medications for this visit. (Ophthalmic Drugs)   Current Outpatient Medications (Other)  Medication Sig   atorvastatin (LIPITOR) 20 MG tablet TAKE 1 TABLET BY MOUTH EVERYDAY AT BEDTIME   Cholecalciferol (VITAMIN D) 50 MCG (2000 UT) tablet Take 2,000 Units by mouth daily.   Cyanocobalamin (B-12 PO) Take 1,000 mcg by mouth daily.   folic acid (FOLVITE) 1 MG tablet TAKE 1 TABLET BY MOUTH EVERY DAY   glipiZIDE (GLUCOTROL) 10 MG tablet Take 10 mg by mouth 2 (two) times daily.   hydroxychloroquine (PLAQUENIL) 200 MG tablet TAKE 1 TABLET BY MOUTH EVERY DAY (MAX OF 30 DAYS ON INSURANCE)   JARDIANCE 25 MG TABS tablet Take 25 mg by mouth every morning.   levocetirizine (XYZAL) 5 MG tablet Take 5 mg by mouth daily as needed for allergies.   losartan (COZAAR) 25 MG tablet Take 25 mg by mouth daily.   metFORMIN (GLUCOPHAGE) 500 MG tablet TAKE 2 TABLETS BY MOUTH TWICE A DAY WITH MORNING AND EVENING MEALS (Patient  taking differently: Take 500-1,000 mg by mouth See admin instructions. Take 1000 mg in the morning and 500 mg in the evening)   methotrexate (RHEUMATREX) 2.5 MG tablet Take 8 tablets (20 mg total) by mouth once a week. Caution:Chemotherapy. Protect from light.   Omega-3 Fatty Acids (FISH OIL OMEGA-3) 1000 MG CAPS Take 1,000 mg by mouth daily.   No current facility-administered medications for this visit. (Other)   REVIEW OF SYSTEMS: ROS   Positive for: Endocrine, Eyes Negative for: Constitutional, Gastrointestinal, Neurological, Skin, Genitourinary, Musculoskeletal, HENT, Cardiovascular, Respiratory, Psychiatric, Allergic/Imm, Heme/Lymph Last edited by Charlette Caffey, COT on 07/19/2023  9:30 AM.     ALLERGIES No Known Allergies  PAST MEDICAL HISTORY Past Medical History:  Diagnosis Date   Diabetes mellitus without complication (HCC)    Rheumatoid arthritis (HCC)    Rotator cuff tear    Past Surgical History:  Procedure Laterality Date   COLONOSCOPY N/A 09/27/2012   Procedure: COLONOSCOPY;  Surgeon: Malissa Hippo, MD;  [facility]: [facility];  Service: Endoscopy;  Laterality: N/A;  1030   COLONOSCOPY WITH PROPOFOL N/A 12/01/2022   Procedure: COLONOSCOPY WITH PROPOFOL;  Surgeon: Dolores Frame, MD;  Location: [facility];  Service: Gastroenterology;  Laterality: N/A;  11:45AM;ASA 1-2   Dilation and [place]     POLYPECTOMY  12/01/2022  Procedure: POLYPECTOMY;  Surgeon: Marguerita Merles, Reuel Boom, MD;  Location: [facility];  Service: Gastroenterology;;   TUBAL LIGATION     FAMILY HISTORY Family History  Problem Relation Age of Onset   Diabetes Mother    Heart disease Mother    Diabetes Father    COPD Sister    Hypertension Brother    Graves' disease Daughter    Colon cancer Neg Hx    SOCIAL HISTORY Social History   Tobacco Use   Smoking status: Former    Current packs/day: 0.00    Average packs/day: 0.5 packs/day for 6.0 years (3.0 ttl pk-yrs)     Types: Cigarettes    Start date: 61    Quit date: 47    Years since quitting: 32.9    Passive exposure: Never   Smokeless tobacco: Never  Vaping Use   Vaping status: Never Used  Substance Use Topics   Alcohol use: No   Drug use: No       OPHTHALMIC EXAM:   Base Eye Exam     Visual Acuity (Snellen - Linear)       Right Left   Dist [place] 20/20 +1 20/25 +1   Dist ph [place]  20/20         Tonometry (Tonopen, 9:34 AM)       Right Left   Pressure 14 16         Pupils       Dark Light Shape React APD   Right 3 2 Round Brisk None   Left 3 2 Round Brisk None         Visual Fields       Left Right    Full Full         Extraocular Movement       Right Left    Full, Ortho Full, Ortho         Neuro/Psych     Oriented x3: Yes   Mood/Affect: Normal         Dilation     Both eyes: 1.0% Mydriacyl, 2.5% Phenylephrine @ 9:31 AM           Slit Lamp and Fundus Exam     Slit Lamp Exam       Right Left   Lids/Lashes Normal Normal   Conjunctiva/Sclera mild melanosis Trace Melanosis   Cornea trace Arcus, trace Punctate epithelial erosions trace Arcus, trace Punctate epithelial erosions   Anterior Chamber Deep and quiet Deep and quiet   Iris Round and dilated, No NVI Round and dilated, No NVI   Lens 2+ Nuclear sclerosis, 2+ Cortical cataract, early brunescense 2+ Nuclear sclerosis, 2+ Cortical cataract mild brunescense   Anterior Vitreous trace Vitreous syneresis trace Vitreous syneresis         Fundus Exam       Right Left   Disc Pink and Sharp, no NVD Pink and Sharp, +fine NVD and early fibrosis nasal disc -- regressed   C/D Ratio 0.3 0.4   Macula Flat, Good foveal reflex, scattered MA, no edema Flat, Good foveal reflex, scattered Microaneurysms/DBH   Vessels attenuated, Tortuous, copper wiring, early NV attenuated, Tortuous, copper wiring, +NV -- regressing   Periphery Attached, rare MA, early focal NV -- scattered, good inferior and nasal  hemisphere PRP laser changes Attached, scattered MA/DBH, focal patches of fibrosis and NV, good 360 PRP and room for fill in            IMAGING AND PROCEDURES  Imaging and Procedures for @TODAY @  OCT, Retina - OU - Both Eyes       Right Eye Quality was good. Central Foveal Thickness: 303. Progression has improved. Findings include normal foveal contour, no SRF, intraretinal fluid, vitreomacular adhesion (Mild focal IRF/cystic changes temporal macula -- slightly improved).   Left Eye Quality was good. Central Foveal Thickness: 301. Progression has been stable. Findings include normal foveal contour, no SRF, intraretinal hyper-reflective material, intraretinal fluid, vitreomacular adhesion (Trace cystic changes temporal macula; partial PVD).   Notes *Images captured and stored on drive  Diagnosis / Impression:  NFP, no SRF, +IRF OU OD: Mild focal IRF/cystic changes temporal macula -- slightly improved OS: Trace cystic changes temporal macula, partial PVD  Clinical management:  See below  Abbreviations: NFP - Normal foveal profile. CME - cystoid macular edema. PED - pigment epithelial detachment. IRF - intraretinal fluid. SRF - subretinal fluid. EZ - ellipsoid zone. ERM - epiretinal membrane. ORA - outer retinal atrophy. ORT - outer retinal tubulation. SRHM - subretinal hyper-reflective material      Fluorescein Angiography Optos (Transit OS)       Right Eye Progression has improved. Early phase findings include microaneurysm, retinal neovascularization, vascular perfusion defect. Mid/Late phase findings include leakage, microaneurysm, retinal neovascularization, vascular perfusion defect (Mild NV greatest IT macula and inferior mid zone-- interval improvement in leakage).   Left Eye Progression has improved. Early phase findings include microaneurysm, retinal neovascularization, vascular perfusion defect. Mid/Late phase findings include leakage, microaneurysm, retinal  neovascularization, vascular perfusion defect (Scattered NV with leakage 360 mid zone slightly improved from prior, new leakage from disc (mild NVD)- improved).   Notes **Images stored on drive**  Impression: PDR OU OD: Mild NV greatest IT macula and inferior mid zone-- interval improvement in leakage OS: Scattered NV with leakage 360 mid zone slightly improved from prior, new leakage from disc (mild NVD)- improved Vascular perfusion defect OU (OS>OD)             ASSESSMENT/PLAN:    ICD-10-CM   1. Proliferative diabetic retinopathy of both eyes with macular edema associated with type 2 diabetes mellitus (HCC)  E11.3513 OCT, Retina - OU - Both Eyes    Fluorescein Angiography Optos (Transit OS)    2. Long term (current) use of oral hypoglycemic drugs  Z79.84     3. Essential hypertension  I10     4. Hypertensive retinopathy of both eyes  H35.033     5. Combined forms of age-related cataract of both eyes  H25.813      1,2. Proliferative diabetic retinopathy w/ DME OU  - Pt lost to f/u after initial visit in 2020  - last A1c 7.9 on 01.25.23 - FA in 2020 showed NVE OS -- pt was scheduled for PRP OS but never made it back - repeat FA (08.07.24) shows OD: Mild NV greatest IT macula and inferior midzone; OS: Scattered NV 360 midzone, new leakage from disc (mild NVD) -- pt would benefit from PRP OU - exam shows 360 MA OU and now NV OU - s/p PRP OS (08.13.24) - s/p PRP OD (09.13.24) - repeat FA (12.17.24) shows OD: Mild NV greatest IT macula and inferior mid zone-- interval improvement in leakage, OS: Scattered NV with leakage 360 mid zone slightly improved from prior, new leakage from disc (mild NVD) - improved - recommend additional PRP OU (OD 1st) in January - RBA of procedure discussed, questions answered - informed consent obtained and signed - see procedure note - f/u  01.15.24 @ 9:45a, PRP fill in OD  3,4. Hypertensive retinopathy OU - discussed importance of tight BP  control - monitor  5. Mixed form age related cataracts OU  - The symptoms of cataract, surgical options, and treatments and risks were discussed with patient. - discussed diagnosis and progression - monitor   Ophthalmic Meds Ordered this visit:  No orders of the defined types were placed in this encounter.    Return in about 29 days (around 08/17/2023) for f/u PDR OU , DFE, OCT, Possible, PRP, OD.  There are no Patient Instructions on file for this visit.  This document serves as a record of services personally performed by Karie Chimera, MD, PhD. It was created on their behalf by Charlette Caffey, COT an ophthalmic technician. The creation of this record is the provider's dictation and/or activities during the visit.    Electronically signed by:  Charlette Caffey, COT  07/21/23 4:08 PM  Karie Chimera, M.D., Ph.D. Diseases & Surgery of the Retina and Vitreous [facility]  I have reviewed the above documentation for accuracy and completeness, and I agree with the above. Karie Chimera, M.D., Ph.D. 07/21/23 4:08 PM  Abbreviations: M myopia (nearsighted); A astigmatism; H hyperopia (farsighted); P presbyopia; Mrx spectacle prescription;  CTL contact lenses; OD right eye; OS left eye; OU both eyes  XT exotropia; ET esotropia; PEK punctate epithelial keratitis; PEE punctate epithelial erosions; DES dry eye syndrome; MGD meibomian gland dysfunction; ATs artificial tears; PFAT's preservative free artificial tears; NSC nuclear sclerotic cataract; PSC posterior subcapsular cataract; ERM epi-retinal membrane; PVD posterior vitreous detachment; RD retinal detachment; DM diabetes mellitus; DR diabetic retinopathy; NPDR non-proliferative diabetic retinopathy; PDR proliferative diabetic retinopathy; CSME clinically significant macular edema; DME diabetic macular edema; dbh dot blot hemorrhages; CWS cotton wool spot; POAG primary open angle glaucoma; C/D cup-to-disc  ratio; HVF humphrey visual field; GVF goldmann visual field; OCT optical coherence tomography; IOP intraocular pressure; BRVO Branch retinal vein occlusion; CRVO central retinal vein occlusion; CRAO central retinal artery occlusion; BRAO branch retinal artery occlusion; RT retinal tear; SB scleral buckle; PPV pars plana vitrectomy; VH Vitreous hemorrhage; PRP panretinal laser photocoagulation; IVK intravitreal kenalog; VMT vitreomacular traction; MH Macular hole;  NVD neovascularization of the disc; NVE neovascularization elsewhere; AREDS age related eye disease study; ARMD age related macular degeneration; POAG primary open angle glaucoma; EBMD epithelial/anterior basement membrane dystrophy; ACIOL anterior chamber intraocular lens; IOL intraocular lens; PCIOL posterior chamber intraocular lens; Phaco/IOL phacoemulsification with intraocular lens placement; PRK photorefractive keratectomy; LASIK laser assisted in situ keratomileusis; HTN hypertension; DM diabetes mellitus; COPD chronic obstructive pulmonary disease

## 2023-07-12 ENCOUNTER — Other Ambulatory Visit: Payer: Self-pay | Admitting: Internal Medicine

## 2023-07-12 DIAGNOSIS — M059 Rheumatoid arthritis with rheumatoid factor, unspecified: Secondary | ICD-10-CM

## 2023-07-19 ENCOUNTER — Encounter (INDEPENDENT_AMBULATORY_CARE_PROVIDER_SITE_OTHER): Payer: Self-pay | Admitting: Ophthalmology

## 2023-07-19 ENCOUNTER — Ambulatory Visit (INDEPENDENT_AMBULATORY_CARE_PROVIDER_SITE_OTHER): Payer: 59 | Admitting: Ophthalmology

## 2023-07-19 VITALS — BP 127/77 | HR 71

## 2023-07-19 DIAGNOSIS — Z7984 Long term (current) use of oral hypoglycemic drugs: Secondary | ICD-10-CM | POA: Diagnosis not present

## 2023-07-19 DIAGNOSIS — E113513 Type 2 diabetes mellitus with proliferative diabetic retinopathy with macular edema, bilateral: Secondary | ICD-10-CM | POA: Diagnosis not present

## 2023-07-19 DIAGNOSIS — H25813 Combined forms of age-related cataract, bilateral: Secondary | ICD-10-CM

## 2023-07-19 DIAGNOSIS — H35033 Hypertensive retinopathy, bilateral: Secondary | ICD-10-CM | POA: Diagnosis not present

## 2023-07-19 DIAGNOSIS — I10 Essential (primary) hypertension: Secondary | ICD-10-CM

## 2023-07-19 DIAGNOSIS — M059 Rheumatoid arthritis with rheumatoid factor, unspecified: Secondary | ICD-10-CM

## 2023-07-19 DIAGNOSIS — Z79899 Other long term (current) drug therapy: Secondary | ICD-10-CM

## 2023-07-21 ENCOUNTER — Encounter (INDEPENDENT_AMBULATORY_CARE_PROVIDER_SITE_OTHER): Payer: Self-pay | Admitting: Ophthalmology

## 2023-07-22 ENCOUNTER — Ambulatory Visit (HOSPITAL_COMMUNITY)
Admission: RE | Admit: 2023-07-22 | Discharge: 2023-07-22 | Disposition: A | Discharge status: Home / Self Care | Payer: 59 | Source: Ambulatory Visit | Attending: Internal Medicine | Admitting: Internal Medicine

## 2023-07-22 ENCOUNTER — Encounter (HOSPITAL_COMMUNITY): Payer: Self-pay

## 2023-07-22 DIAGNOSIS — Z1231 Encounter for screening mammogram for malignant neoplasm of breast: Secondary | ICD-10-CM | POA: Insufficient documentation

## 2023-08-17 NOTE — Progress Notes (Signed)
[facility] - Clinic Note  08/24/2023     CHIEF COMPLAINT Patient presents for Retina Follow Up   HISTORY OF PRESENT ILLNESS: Brooke Sawyer is a 63 y.o. female who presents to the clinic today for:   HPI     Retina Follow Up   Patient presents with  Diabetic Retinopathy.  In both eyes.  This started 1 month ago.  Duration of 1 month.  Since onset it is stable.  I, the attending physician,  performed the HPI with the patient and updated documentation appropriately.        Comments   1 month retina follow up PDR ou and possible PRP OD pt is reporting no vision changes noticed she denies any flashes or floaters last reading 125 today       Last edited by Rennis Chris, MD on 08/24/2023 11:01 AM.     Referring physician: Daisy Lazar, DO [address] [place],  [place] [address]  HISTORICAL INFORMATION:   Selected notes from the MEDICAL RECORD NUMBER    CURRENT MEDICATIONS: Current Outpatient Medications (Ophthalmic Drugs)  Medication Sig   prednisoLONE acetate (PRED FORTE) 1 % ophthalmic suspension Place 1 drop into the right eye 4 (four) times daily for 7 days.   No current facility-administered medications for this visit. (Ophthalmic Drugs)   Current Outpatient Medications (Other)  Medication Sig   atorvastatin (LIPITOR) 20 MG tablet TAKE 1 TABLET BY MOUTH EVERYDAY AT BEDTIME   Cholecalciferol (VITAMIN D) 50 MCG (2000 UT) tablet Take 2,000 Units by mouth daily.   Cyanocobalamin (B-12 PO) Take 1,000 mcg by mouth daily.   folic acid (FOLVITE) 1 MG tablet TAKE 1 TABLET BY MOUTH EVERY DAY   glipiZIDE (GLUCOTROL) 10 MG tablet Take 10 mg by mouth 2 (two) times daily.   hydroxychloroquine (PLAQUENIL) 200 MG tablet TAKE 1 TABLET BY MOUTH EVERY DAY (MAX OF 30 DAYS ON INSURANCE)   JARDIANCE 25 MG TABS tablet Take 25 mg by mouth every morning.   levocetirizine (XYZAL) 5 MG tablet Take 5 mg by mouth daily as needed for allergies.   losartan (COZAAR) 25 MG  tablet Take 25 mg by mouth daily.   metFORMIN (GLUCOPHAGE) 500 MG tablet TAKE 2 TABLETS BY MOUTH TWICE A DAY WITH MORNING AND EVENING MEALS (Patient taking differently: Take 500-1,000 mg by mouth See admin instructions. Take 1000 mg in the morning and 500 mg in the evening)   methotrexate (RHEUMATREX) 2.5 MG tablet Take 8 tablets (20 mg total) by mouth once a week. Caution:Chemotherapy. Protect from light.   Omega-3 Fatty Acids (FISH OIL OMEGA-3) 1000 MG CAPS Take 1,000 mg by mouth daily.   No current facility-administered medications for this visit. (Other)   REVIEW OF SYSTEMS: ROS   Positive for: Endocrine, Eyes Negative for: Constitutional, Gastrointestinal, Neurological, Skin, Genitourinary, Musculoskeletal, HENT, Cardiovascular, Respiratory, Psychiatric, Allergic/Imm, Heme/Lymph Last edited by Etheleen Mayhew, COT on 08/24/2023  9:09 AM.      ALLERGIES No Known Allergies  PAST MEDICAL HISTORY Past Medical History:  Diagnosis Date   Diabetes mellitus without complication (HCC)    Rheumatoid arthritis (HCC)    Rotator cuff tear    Past Surgical History:  Procedure Laterality Date   COLONOSCOPY N/A 09/27/2012   Procedure: COLONOSCOPY;  Surgeon: Malissa Hippo, MD;  Location: [facility];  Service: Endoscopy;  Laterality: N/A;  1030   COLONOSCOPY WITH PROPOFOL N/A 12/01/2022   Procedure: COLONOSCOPY WITH PROPOFOL;  Surgeon: Dolores Frame,  MD;  Location: [facility];  Service: Gastroenterology;  Laterality: N/A;  11:45AM;ASA 1-2   Dilation and [place]     POLYPECTOMY  12/01/2022   Procedure: POLYPECTOMY;  Surgeon: Marguerita Merles, Reuel Boom, MD;  Location: [facility];  Service: Gastroenterology;;   TUBAL LIGATION     FAMILY HISTORY Family History  Problem Relation Age of Onset   Diabetes Mother    Heart disease Mother    Diabetes Father    COPD Sister    Hypertension Brother    Graves' disease Daughter    Colon cancer Neg Hx    SOCIAL  HISTORY Social History   Tobacco Use   Smoking status: Former    Current packs/day: 0.00    Average packs/day: 0.5 packs/day for 6.0 years (3.0 ttl pk-yrs)    Types: Cigarettes    Start date: 33    Quit date: 59    Years since quitting: 33.0    Passive exposure: Never   Smokeless tobacco: Never  Vaping Use   Vaping status: Never Used  Substance Use Topics   Alcohol use: No   Drug use: No       OPHTHALMIC EXAM:   Base Eye Exam     Visual Acuity (Snellen - Linear)       Right Left   Dist [place] 20/25 -1 20/25 -2   Dist ph [place] NI NI         Tonometry (Tonopen, 9:13 AM)       Right Left   Pressure 14 16         Pupils       Pupils Dark Light Shape React APD   Right PERRL 3 2 Round Brisk None   Left PERRL 3 2 Round Brisk None         Visual Fields       Left Right    Full Full         Extraocular Movement       Right Left    Full, Ortho Full, Ortho         Neuro/Psych     Oriented x3: Yes   Mood/Affect: Normal         Dilation     Both eyes: 2.5% Phenylephrine @ 9:13 AM           Slit Lamp and Fundus Exam     Slit Lamp Exam       Right Left   Lids/Lashes Normal Normal   Conjunctiva/Sclera mild melanosis Trace Melanosis   Cornea trace Arcus, trace Punctate epithelial erosions trace Arcus, trace Punctate epithelial erosions   Anterior Chamber Deep and quiet Deep and quiet   Iris Round and dilated, No NVI Round and dilated, No NVI   Lens 2+ Nuclear sclerosis, 2+ Cortical cataract, early brunescense 2+ Nuclear sclerosis, 2+ Cortical cataract mild brunescense   Anterior Vitreous trace Vitreous syneresis trace Vitreous syneresis         Fundus Exam       Right Left   Disc Pink and Sharp, no NVD Pink and Sharp, +fine NVD and early fibrosis nasal disc -- regressed   C/D Ratio 0.3 0.4   Macula Flat, Good foveal reflex, scattered MA, no edema Flat, Good foveal reflex, scattered Microaneurysms/DBH   Vessels attenuated,  Tortuous, copper wiring, early NV attenuated, Tortuous, copper wiring, +NV -- regressing   Periphery Attached, rare MA, early focal NV -- scattered, good inferior and nasal hemisphere PRP laser changes Attached, scattered  MA/DBH, focal patches of fibrosis and NV, good 360 PRP and room for fill in            IMAGING AND PROCEDURES  Imaging and Procedures for @TODAY @  OCT, Retina - OU - Both Eyes       Right Eye Quality was good. Central Foveal Thickness: 298. Progression has improved. Findings include normal foveal contour, no SRF, intraretinal fluid, vitreomacular adhesion (Mild focal IRF/cystic changes temporal macula -- slightly improved).   Left Eye Quality was good. Central Foveal Thickness: 300. Progression has been stable. Findings include normal foveal contour, no SRF, intraretinal hyper-reflective material, intraretinal fluid, vitreomacular adhesion (Trace cystic changes temporal macula; partial PVD).   Notes *Images captured and stored on drive  Diagnosis / Impression:  NFP, no SRF, +IRF OU OD: Mild focal IRF/cystic changes temporal macula -- slightly improved OS: Trace cystic changes temporal macula, partial PVD  Clinical management:  See below  Abbreviations: NFP - Normal foveal profile. CME - cystoid macular edema. PED - pigment epithelial detachment. IRF - intraretinal fluid. SRF - subretinal fluid. EZ - ellipsoid zone. ERM - epiretinal membrane. ORA - outer retinal atrophy. ORT - outer retinal tubulation. SRHM - subretinal hyper-reflective material      Panretinal Photocoagulation - OD - Right Eye       LASER PROCEDURE NOTE  Diagnosis:   Proliferative Diabetic Retinopathy, RIGHT EYE  Procedure:  Pan-retinal photocoagulation using slit lamp laser, fill-in, RIGHT EYE  Anesthesia:  Topical  Surgeon: Rennis Chris, MD, PhD  Informed consent obtained, operative eye marked, and time out performed prior to initiation of laser.   Lumenis [id number] slit lamp  laser Pattern: 3x3 square Power: 280 mW Duration: 30 msec  Spot size: 200 microns  # spots: 713 spots -- fill in -- mostly around distal IT arcades and nasally  Complications: None.  RTC: 3 wk - OCT, possible PRP fill in OS  Patient tolerated the procedure well and received written and verbal post-procedure care information/education.            ASSESSMENT/PLAN:    ICD-10-CM   1. Proliferative diabetic retinopathy of both eyes with macular edema associated with type 2 diabetes mellitus (HCC)  E11.3513 OCT, Retina - OU - Both Eyes    Panretinal Photocoagulation - OD - Right Eye    2. Long term (current) use of oral hypoglycemic drugs  Z79.84     3. Essential hypertension  I10     4. Hypertensive retinopathy of both eyes  H35.033     5. Combined forms of age-related cataract of both eyes  H25.813      1,2. Proliferative diabetic retinopathy w/ DME OU  - Pt lost to f/u after initial visit in 2020  - last A1c 7.9 (01.25.23) - FA in 2020 showed NVE OS -- pt was scheduled for PRP OS but never made it back - repeat FA (08.07.24) shows OD: Mild NV greatest IT macula and inferior midzone; OS: Scattered NV 360 midzone, new leakage from disc (mild NVD) -- pt would benefit from PRP OU - exam shows 360 MA OU and now NV OU - s/p PRP OS (08.13.24) - s/p PRP OD (09.13.24) - repeat FA (12.17.24) shows OD: Mild NV greatest IT macula and inferior mid zone-- interval improvement in leakage, OS: Scattered NV with leakage 360 mid zone slightly improved from prior, new leakage from disc (mild NVD) - improved -- discussed benefit of PRP fill in OU - recommend PRP OD today, 01.22.24 -  pt wishes to proceed with laser OD - RBA of procedure discussed, questions answered - informed consent obtained and signed - see procedure note - start PF QID OD x7 days - f/u Feb 12th possible fill in PRP OS  3,4. Hypertensive retinopathy OU - discussed importance of tight BP control - monitor  5. Mixed  form age related cataracts OU  - The symptoms of cataract, surgical options, and treatments and risks were discussed with patient. - discussed diagnosis and progression - monitor  Ophthalmic Meds Ordered this visit:  Meds ordered this encounter  Medications   prednisoLONE acetate (PRED FORTE) 1 % ophthalmic suspension    Sig: Place 1 drop into the right eye 4 (four) times daily for 7 days.    Dispense:  1.4 mL    Refill:  0     Return in about 3 weeks (around 09/14/2023) for f/u possible PRP OS.  There are no Patient Instructions on file for this visit.  This document serves as a record of services personally performed by Karie Chimera, MD, PhD. It was created on their behalf by Glee Arvin. Manson Passey, OA an ophthalmic technician. The creation of this record is the provider's dictation and/or activities during the visit.    Electronically signed by: Glee Arvin. Manson Passey, OA 08/24/23 11:10 AM  This document serves as a record of services personally performed by Karie Chimera, MD, PhD. It was created on their behalf by Charlette Caffey, COT an ophthalmic technician. The creation of this record is the provider's dictation and/or activities during the visit.    Electronically signed by:  Charlette Caffey, COT  08/24/23 11:10 AM  Karie Chimera, M.D., Ph.D. Diseases & Surgery of the Retina and Vitreous [facility]  I have reviewed the above documentation for accuracy and completeness, and I agree with the above. Karie Chimera, M.D., Ph.D. 08/24/23 11:15 AM   Abbreviations: M myopia (nearsighted); A astigmatism; H hyperopia (farsighted); P presbyopia; Mrx spectacle prescription;  CTL contact lenses; OD right eye; OS left eye; OU both eyes  XT exotropia; ET esotropia; PEK punctate epithelial keratitis; PEE punctate epithelial erosions; DES dry eye syndrome; MGD meibomian gland dysfunction; ATs artificial tears; PFAT's preservative free artificial tears; NSC nuclear  sclerotic cataract; PSC posterior subcapsular cataract; ERM epi-retinal membrane; PVD posterior vitreous detachment; RD retinal detachment; DM diabetes mellitus; DR diabetic retinopathy; NPDR non-proliferative diabetic retinopathy; PDR proliferative diabetic retinopathy; CSME clinically significant macular edema; DME diabetic macular edema; dbh dot blot hemorrhages; CWS cotton wool spot; POAG primary open angle glaucoma; C/D cup-to-disc ratio; HVF humphrey visual field; GVF goldmann visual field; OCT optical coherence tomography; IOP intraocular pressure; BRVO Branch retinal vein occlusion; CRVO central retinal vein occlusion; CRAO central retinal artery occlusion; BRAO branch retinal artery occlusion; RT retinal tear; SB scleral buckle; PPV pars plana vitrectomy; VH Vitreous hemorrhage; PRP panretinal laser photocoagulation; IVK intravitreal kenalog; VMT vitreomacular traction; MH Macular hole;  NVD neovascularization of the disc; NVE neovascularization elsewhere; AREDS age related eye disease study; ARMD age related macular degeneration; POAG primary open angle glaucoma; EBMD epithelial/anterior basement membrane dystrophy; ACIOL anterior chamber intraocular lens; IOL intraocular lens; PCIOL posterior chamber intraocular lens; Phaco/IOL phacoemulsification with intraocular lens placement; PRK photorefractive keratectomy; LASIK laser assisted in situ keratomileusis; HTN hypertension; DM diabetes mellitus; COPD chronic obstructive pulmonary disease

## 2023-08-24 ENCOUNTER — Encounter (INDEPENDENT_AMBULATORY_CARE_PROVIDER_SITE_OTHER): Payer: Self-pay | Admitting: Ophthalmology

## 2023-08-24 ENCOUNTER — Ambulatory Visit (INDEPENDENT_AMBULATORY_CARE_PROVIDER_SITE_OTHER): Payer: 59 | Admitting: Ophthalmology

## 2023-08-24 DIAGNOSIS — H35033 Hypertensive retinopathy, bilateral: Secondary | ICD-10-CM | POA: Diagnosis not present

## 2023-08-24 DIAGNOSIS — I10 Essential (primary) hypertension: Secondary | ICD-10-CM | POA: Diagnosis not present

## 2023-08-24 DIAGNOSIS — E113513 Type 2 diabetes mellitus with proliferative diabetic retinopathy with macular edema, bilateral: Secondary | ICD-10-CM | POA: Diagnosis not present

## 2023-08-24 DIAGNOSIS — H25813 Combined forms of age-related cataract, bilateral: Secondary | ICD-10-CM

## 2023-08-24 DIAGNOSIS — Z79899 Other long term (current) drug therapy: Secondary | ICD-10-CM

## 2023-08-24 DIAGNOSIS — Z7984 Long term (current) use of oral hypoglycemic drugs: Secondary | ICD-10-CM | POA: Diagnosis not present

## 2023-08-24 DIAGNOSIS — M059 Rheumatoid arthritis with rheumatoid factor, unspecified: Secondary | ICD-10-CM

## 2023-08-24 MED ORDER — PREDNISOLONE ACETATE 1 % OP SUSP: 1.0000 [drp] | Freq: Four times a day (QID) | OPHTHALMIC | 0 refills | Status: AC

## 2023-08-29 NOTE — Progress Notes (Signed)
 Office Visit Note  Patient: Brooke Sawyer             Date of Birth: 13-Mar-1961           MRN: [id number]             PCP: Veda Gerald, MD Referring: Veda Gerald, MD Visit Date: 09/12/2023   Subjective:  Follow-up   Discussed the use of AI scribe software for clinical note transcription with the patient, who gave verbal consent to proceed.  History of Present Illness   Brooke Sawyer is a 63 y.o. female here for follow up for seropositive RA on methotrexate  20 mg p.o. weekly and hydroxychloroquine  200 mg daily folic acid  1 mg daily.    She has rheumatoid arthritis with no recent flare-ups and no issues with her current medications. Despite a slightly elevated sedimentation rate, she has no new or worsening symptoms, including swelling. She notes that her fingers are naturally 'fat.' There are no significant changes in her condition.  She is undergoing laser treatments for retinal issues related to diabetes, managed by Dr. Karyl Paget. These treatments are proceeding without complications.    Previous HPI 06/10/2023 Brooke Sawyer is a 63 y.o. female here for follow up for seropositive RA on methotrexate  20 mg p.o. weekly and hydroxychloroquine  200 mg daily folic acid  1 mg daily.  Lab results at previous visit with sedimentation rate 1 point above normal no particular disease activity.  Overall she is doing great with no arthritis flareups and does not have significant daily symptoms.  No prolonged morning stiffness.  She had additional follow-up with Dr. Karyl Paget in September due to proliferative diabetic retinopathy.   Previous HPI 03/10/2023 Brooke Sawyer is a 63 y.o. female here for follow up for seropositive RA on methotrexate  20 mg p.o. weekly and hydroxychloroquine  200 mg daily folic acid  1 mg daily.  She is doing well overall no significant disease flare but not noticing any visible joint swelling.  Does not have any prolonged morning stiffness.  She is working on keeping regular  physical activity weight appears stable compared to visit earlier this year.  She missed her original appointment about 2 weeks ago so rescheduled to today.  Had ophthalmology exam looked fine for continuing Plaquenil .   Previous HPI 10/20/2022 Brooke Sawyer is a 63 y.o. female here for follow up for seropositive RA on methotrexate  20 mg p.o. weekly and hydroxychloroquine  200 mg daily folic acid  1 mg daily.  Overall arthritis symptoms are doing well since her last visit no new flareups.  Not having prolonged morning stiffness or significant joint pain during winter weather this year.  She has not had any significant infections since her last visit.  Blood work at previous visit did show mild elevation in sedimentation rate but no particular signs of disease activity.    Previous HPI 06/30/22 Brooke Sawyer is a 63 y.o. female here for follow up for seropositive RA on MTX 20 mg PO weekly and hydroxychloroquine  200 mg daily and folic acid  1 mg daily.  Overall her arthritis symptoms are doing very well since her last visit.  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   Previous HPI 03/30/22 Brooke Sawyer is a 63 y.o. female here for follow up for seropositive RA on MTX 20 mg PO weekly and HCQ 200 mg daily. She feels symptoms  are well controlled and no problems with medications. Discussed HCQ screening with her eye doctor at last visit apparently was not specifically addressing this before because it had not been discussed.   Previous HPI 12/16/2021 Brooke Sawyer is a 63 y.o. female here for follow up for seropositive RA on methotrexate  20 mg PO weekly and hydroxychloroquine  200 mg daily. Since our last visit she is doing well no increase in symptoms. No trouble with medications or other health events.   Previous HPI 09/15/2021 Brooke Sawyer is a 63 y.o. female here for follow up for  seropositive RA on methotrexate  20 mg PO weekly and hydroxychloroquine  200 mg daily. She feels symptoms are very well controlled. No significant morning stiffness. No interval complications or infections.    Previous HPI 06/17/21 Brooke Sawyer is a 63 y.o. female here for follow up after initial visit for transfer of care for rheumatoid arthritis initially to continue treatment with methotrexate  20 mg PO weekly. Lab tests at initial visit showing ESR of 34 minimal elevation baseline CBC and CMP unremarkable. Since our last visit she feels symptoms are doing pretty well she finished taking the steroids uneventfully. She saw optometrist told her she needs to see the ophthalmologist, says the last visit in 2020 workup was extensive and had a patient cost of $500 so has not been regularly following up with Dr. Karyl Paget recently.   Previous HPI 06/03/21 Brooke Sawyer is a 63 y.o. female here for seropositive, erosive RA previously managed at [facility] rheumatology. She was originally diagnosed in 2019 with symptoms of bilateral finger joints pain and swelling and decreased mobility. Rheumatology workup showing positive RF, CCP, and erosive disease on xray imaging and she started treatment with methotrexate  and folic acid  with initial 1 week prednisone taper. She has felt overall well controlled disease on this regimen. Some ongoing swelling led to adding hydroxychloroquine  to methotrexate  but had a hard time getting this due to COVID related acces issues so never stayed on it. She changed insurance due to her husband retiring on medicare benefits and unable to follow up with [facility] on new insurance. She is off methotrexate  treatment for several months and has significant finger joint swelling, stiffness, and pain. She gets partial relief with NSAIDs which she currently uses to treated her finger pain and swelling. She broke her 4th and 5th does from a minor trauma with losing her balance. She has  never suffered a major bone fracture. She takes daily vitamin D supplementation but reports never having bone density screening for osteoporosis.   Review of Systems  Constitutional:  Negative for fatigue.  HENT:  Negative for mouth sores and mouth dryness.   Eyes:  Negative for dryness.  Respiratory:  Negative for shortness of breath.   Cardiovascular:  Negative for chest pain and palpitations.  Gastrointestinal:  Negative for blood in stool, constipation and diarrhea.  Endocrine: Negative for increased urination.  Genitourinary:  Negative for involuntary urination.  Musculoskeletal:  Negative for joint pain, gait problem, joint pain, joint swelling, myalgias, muscle weakness, morning stiffness, muscle tenderness and myalgias.  Skin:  Negative for color change, rash, hair loss and sensitivity to sunlight.  Allergic/Immunologic: Negative for susceptible to infections.  Neurological:  Negative for dizziness and headaches.  Hematological:  Negative for swollen glands.  Psychiatric/Behavioral:  Negative for depressed mood and sleep disturbance. The patient is not nervous/anxious.     PMFS History:  Patient Active Problem List   Diagnosis Date Noted  History of colonic polyps 12/01/2022   High risk medication use 06/03/2021   Proliferative diabetic retinopathy of left eye with macular edema associated with type 2 diabetes mellitus (HCC) 05/27/2021   Type 2 diabetes mellitus with diabetic retinopathy (HCC) 05/25/2021   Seropositive rheumatoid arthritis (HCC) 04/14/2018   Atypical squamous cell changes of undetermined significance (ASCUS) on vaginal cytology 08/23/2013   Partial nontraumatic tear of rotator cuff 10/18/2012   Rotator cuff syndrome of right shoulder 10/18/2012    Past Medical History:  Diagnosis Date   Diabetes mellitus without complication (HCC)    Rheumatoid arthritis (HCC)    Rotator cuff tear     Family History  Problem Relation Age of Onset   Diabetes Mother     Heart disease Mother    Diabetes Father    COPD Sister    Hypertension Brother    Murrell Arrant' disease Daughter    Colon cancer Neg Hx    Past Surgical History:  Procedure Laterality Date   COLONOSCOPY N/A 09/27/2012   Procedure: COLONOSCOPY;  Surgeon: Ruby Corporal, MD;  Location: [facility];  Service: Endoscopy;  Laterality: N/A;  1030   COLONOSCOPY WITH PROPOFOL  N/A 12/01/2022   Procedure: COLONOSCOPY WITH PROPOFOL ;  Surgeon: Urban Garden, MD;  Location: [facility];  Service: Gastroenterology;  Laterality: N/A;  11:45AM;ASA 1-2   Dilation and Currettage     POLYPECTOMY  12/01/2022   Procedure: POLYPECTOMY;  Surgeon: Urban Garden, MD;  Location: [facility];  Service: Gastroenterology;;   TUBAL LIGATION     Social History   Social History Narrative   Not on file   Immunization History  Administered Date(s) Administered   Influenza Inj Mdck Quad Pf 05/12/2018, 04/21/2019   Influenza,inj,Quad PF,6+ Mos 05/07/2017   Influenza-Unspecified 05/11/2021   Moderna SARS-COV2 Booster Vaccination 08/11/2021   Moderna Sars-Covid-2 Vaccination 11/08/2019, 12/04/2019     Objective: Vital Signs: BP 101/64 (BP Location: Left Arm, Patient Position: Sitting, Cuff Size: Large)   Pulse 75   Resp 14   Ht 5\' 4"  (1.626 m)   Wt 211 lb (95.7 kg)   LMP 05/25/2016   BMI 36.22 kg/m    Physical Exam Constitutional:      Appearance: She is obese.  Eyes:     Conjunctiva/sclera: Conjunctivae normal.  Cardiovascular:     Rate and Rhythm: Normal rate and regular rhythm.  Pulmonary:     Effort: Pulmonary effort is normal.     Breath sounds: Normal breath sounds.  Musculoskeletal:     Right lower leg: No edema.     Left lower leg: No edema.  Lymphadenopathy:     Cervical: No cervical adenopathy.  Skin:    General: Skin is warm and dry.     Findings: No rash.  Neurological:     Mental Status: She is alert.  Psychiatric:        Mood and Affect: Mood normal.       Musculoskeletal Exam:  Shoulders full ROM no tenderness or swelling Elbows full ROM no tenderness or swelling Wrists full ROM no tenderness or swelling Fingers chronic joint widening at right 1st-3rd MCPs, mild overlying swelling first chronic soft tissue thickening no tenderness to pressure No paraspinal tenderness to palpation over upper and lower back Hip normal internal and external rotation without pain, no tenderness to lateral hip palpation Knees full ROM no tenderness or swelling, patellofemoral crepitus present   Investigation: No additional findings.  Imaging: Panretinal Photocoagulation - OD -  Right Eye Result Date: 08/24/2023 LASER PROCEDURE NOTE Diagnosis:   Proliferative Diabetic Retinopathy, RIGHT EYE Procedure:  Pan-retinal photocoagulation using slit lamp laser, fill-in, RIGHT EYE Anesthesia:  Topical Surgeon: Ronelle Coffee, MD, PhD Informed consent obtained, operative eye marked, and time out performed prior to initiation of laser. Lumenis [id number] slit lamp laser Pattern: 3x3 square Power: 280 mW Duration: 30 msec Spot size: 200 microns # spots: 713 spots -- fill in -- mostly around distal IT arcades and nasally Complications: None. RTC: 3 wk - OCT, possible PRP fill in OS Patient tolerated the procedure well and received written and verbal post-procedure care information/education.   OCT, Retina - OU - Both Eyes Result Date: 08/24/2023 Right Eye Quality was good. Central Foveal Thickness: 298. Progression has improved. Findings include normal foveal contour, no SRF, intraretinal fluid, vitreomacular adhesion (Mild focal IRF/cystic changes temporal macula -- slightly improved). Left Eye Quality was good. Central Foveal Thickness: 300. Progression has been stable. Findings include normal foveal contour, no SRF, intraretinal hyper-reflective material, intraretinal fluid, vitreomacular adhesion (Trace cystic changes temporal macula; partial PVD). Notes *Images captured and stored  on drive Diagnosis / Impression: NFP, no SRF, +IRF OU OD: Mild focal IRF/cystic changes temporal macula -- slightly improved OS: Trace cystic changes temporal macula, partial PVD Clinical management: See below Abbreviations: NFP - Normal foveal profile. CME - cystoid macular edema. PED - pigment epithelial detachment. IRF - intraretinal fluid. SRF - subretinal fluid. EZ - ellipsoid zone. ERM - epiretinal membrane. ORA - outer retinal atrophy. ORT - outer retinal tubulation. SRHM - subretinal hyper-reflective material    Recent Labs: Lab Results  Component Value Date   WBC 5.9 06/10/2023   HGB 11.6 (L) 06/10/2023   PLT 238 06/10/2023   NA 141 06/10/2023   K 4.7 06/10/2023   CL 107 06/10/2023   CO2 26 06/10/2023   GLUCOSE 89 06/10/2023   BUN 13 06/10/2023   CREATININE 0.71 06/10/2023   BILITOT 0.3 06/10/2023   AST 13 06/10/2023   ALT 14 06/10/2023   PROT 7.4 06/10/2023   CALCIUM  9.5 06/10/2023   GFRAA  05/15/2008    >60        The eGFR has been calculated using the MDRD equation. This calculation has not been validated in all clinical    Speciality Comments: PLQ Eye Exam: 03/10/2023 WNL @ [facility] Follow up 1 year  Procedures:  No procedures performed Allergies: Patient has no known allergies.   Assessment / Plan:     Visit Diagnoses: Seropositive rheumatoid arthritis (HCC) - Plan: Sedimentation rate, methotrexate  (RHEUMATREX) 2.5 MG tablet, hydroxychloroquine  (PLAQUENIL ) 200 MG tablet, folic acid  (FOLVITE ) 1 MG tablet Stable without flare-ups. Mildly elevated sedimentation rate but no clinical signs of active disease. -Checking sed rate for disease activity monitoring -Continue methotrexate  20 mg p.o. weekly hydroxychloroquine  200 mg p.o. daily and folic acid  1 mg p.o. daily -Plan to recheck hand x-rays later in the year to assess for any joint damage with previous mild chronic joint deformity with third digit lateral deviation.  High risk medication  use - methotrexate  20 mg p.o. weekly and folic acid  1 mg daily and hydroxychloroquine  200 mg daily. PLQ Eye Exam: 03/10/2023 WNL - Plan: CBC with Differential/Platelet, COMPLETE METABOLIC PANEL WITH GFR Tolerating medicines and no serious interval infection.  Most recent eye exam no concern for hydroxychloroquine  retinal toxicity. -Checking CBC and CMP for medication monitoring on continued long-term use of methotrexate  hydroxychloroquine    Orders: Orders Placed  This Encounter  Procedures   Sedimentation rate   CBC with Differential/Platelet   COMPLETE METABOLIC PANEL WITH GFR   Meds ordered this encounter  Medications   methotrexate  (RHEUMATREX) 2.5 MG tablet    Sig: Take 8 tablets (20 mg total) by mouth once a week. Caution:Chemotherapy. Protect from light.    Dispense:  104 tablet    Refill:  0   hydroxychloroquine  (PLAQUENIL ) 200 MG tablet    Sig: Take 1 tablet (200 mg total) by mouth daily.    Dispense:  90 tablet    Refill:  1   folic acid  (FOLVITE ) 1 MG tablet    Sig: Take 1 tablet (1 mg total) by mouth daily.    Dispense:  90 tablet    Refill:  3    Dx Code: M05.9     Follow-Up Instructions: Return in about 3 months (around 12/10/2023) for RA on MTX/HCQ f/u 3mos.   Matt Song, MD  Note - This record has been created using [employer].  Chart creation errors have been sought, but may not always  have been located. Such creation errors do not reflect on  the standard of medical care.

## 2023-09-01 NOTE — Progress Notes (Signed)
[facility] - Clinic Note  09/13/2023     CHIEF COMPLAINT Patient presents for Retina Follow Up   HISTORY OF PRESENT ILLNESS: Brooke Sawyer is a 63 y.o. female who presents to the clinic today for:   HPI     Retina Follow Up   Patient presents with  Diabetic Retinopathy.  In both eyes.  This started 3 weeks ago.  Duration of 3 weeks.  Since onset it is stable.  I, the attending physician,  performed the HPI with the patient and updated documentation appropriately.        Comments   3 week retina follow up PDR OS and PRP OS pt is reporting no vision changes noticed she denies any flashes or floaters her last reading was 120       Last edited by Rennis Chris, MD on 09/13/2023 10:58 AM.      Referring physician: Daisy Lazar, DO [address] [place],  [place] [address]  HISTORICAL INFORMATION:   Selected notes from the MEDICAL RECORD NUMBER    CURRENT MEDICATIONS: No current outpatient medications on file. (Ophthalmic Drugs)   No current facility-administered medications for this visit. (Ophthalmic Drugs)   Current Outpatient Medications (Other)  Medication Sig   atorvastatin (LIPITOR) 20 MG tablet TAKE 1 TABLET BY MOUTH EVERYDAY AT BEDTIME   Cholecalciferol (VITAMIN D) 50 MCG (2000 UT) tablet Take 2,000 Units by mouth daily.   Cyanocobalamin (B-12 PO) Take 1,000 mcg by mouth daily.   folic acid (FOLVITE) 1 MG tablet Take 1 tablet (1 mg total) by mouth daily.   glipiZIDE (GLUCOTROL) 10 MG tablet Take 10 mg by mouth 2 (two) times daily.   hydroxychloroquine (PLAQUENIL) 200 MG tablet Take 1 tablet (200 mg total) by mouth daily.   JARDIANCE 25 MG TABS tablet Take 25 mg by mouth every morning.   levocetirizine (XYZAL) 5 MG tablet Take 5 mg by mouth daily as needed for allergies.   losartan (COZAAR) 25 MG tablet Take 25 mg by mouth daily.   metFORMIN (GLUCOPHAGE) 500 MG tablet TAKE 2 TABLETS BY MOUTH TWICE A DAY WITH MORNING AND EVENING MEALS  (Patient taking differently: Take 500-1,000 mg by mouth See admin instructions. Take 1000 mg in the morning and 500 mg in the evening)   methotrexate (RHEUMATREX) 2.5 MG tablet Take 8 tablets (20 mg total) by mouth once a week. Caution:Chemotherapy. Protect from light.   Omega-3 Fatty Acids (FISH OIL OMEGA-3) 1000 MG CAPS Take 1,000 mg by mouth daily.   No current facility-administered medications for this visit. (Other)   REVIEW OF SYSTEMS: ROS   Positive for: Endocrine, Eyes Negative for: Constitutional, Gastrointestinal, Neurological, Skin, Genitourinary, Musculoskeletal, HENT, Cardiovascular, Respiratory, Psychiatric, Allergic/Imm, Heme/Lymph Last edited by Etheleen Mayhew, COT on 09/13/2023  9:00 AM.     ALLERGIES No Known Allergies  PAST MEDICAL HISTORY Past Medical History:  Diagnosis Date   Diabetes mellitus without complication (HCC)    Rheumatoid arthritis (HCC)    Rotator cuff tear    Past Surgical History:  Procedure Laterality Date   COLONOSCOPY N/A 09/27/2012   Procedure: COLONOSCOPY;  Surgeon: Malissa Hippo, MD;  [facility]: [facility];  Service: Endoscopy;  Laterality: N/A;  1030   COLONOSCOPY WITH PROPOFOL N/A 12/01/2022   Procedure: COLONOSCOPY WITH PROPOFOL;  Surgeon: Dolores Frame, MD;  Location: [facility];  Service: Gastroenterology;  Laterality: N/A;  11:45AM;ASA 1-2   Dilation and [place]     POLYPECTOMY  12/01/2022   Procedure: POLYPECTOMY;  Surgeon: Marguerita Merles, Reuel Boom, MD;  Location: [facility];  Service: Gastroenterology;;   TUBAL LIGATION     FAMILY HISTORY Family History  Problem Relation Age of Onset   Diabetes Mother    Heart disease Mother    Diabetes Father    COPD Sister    Hypertension Brother    Graves' disease Daughter    Colon cancer Neg Hx    SOCIAL HISTORY Social History   Tobacco Use   Smoking status: Former    Current packs/day: 0.00    Average packs/day: 0.5 packs/day for 6.0 years (3.0 ttl  pk-yrs)    Types: Cigarettes    Start date: 74    Quit date: 26    Years since quitting: 33.1    Passive exposure: Never   Smokeless tobacco: Never  Vaping Use   Vaping status: Never Used  Substance Use Topics   Alcohol use: No   Drug use: No       OPHTHALMIC EXAM:   Base Eye Exam     Visual Acuity (Snellen - Linear)       Right Left   Dist [place] 20/30 20/25 -2   Dist ph [place] 20/25 -2 NI         Tonometry (Tonopen, 9:06 AM)       Right Left   Pressure 12 18         Pupils       Pupils Dark Light Shape React APD   Right PERRL 3 2 Round Brisk None   Left PERRL 3 2 Round Brisk None         Visual Fields       Left Right    Full Full         Extraocular Movement       Right Left    Full, Ortho Full, Ortho         Neuro/Psych     Oriented x3: Yes   Mood/Affect: Normal         Dilation     Left eye: 2.5% Phenylephrine @ 9:07 AM           Slit Lamp and Fundus Exam     Slit Lamp Exam       Right Left   Lids/Lashes Normal Normal   Conjunctiva/Sclera mild melanosis Trace Melanosis   Cornea trace Arcus, trace Punctate epithelial erosions trace Arcus, trace Punctate epithelial erosions   Anterior Chamber Deep and quiet Deep and quiet   Iris Round and dilated, No NVI Round and dilated, No NVI   Lens 2+ Nuclear sclerosis, 2+ Cortical cataract, early brunescense 2+ Nuclear sclerosis, 2+ Cortical cataract mild brunescense   Anterior Vitreous trace Vitreous syneresis trace Vitreous syneresis         Fundus Exam       Right Left   Disc Pink and Sharp, no NVD Pink and Sharp, +fine NVD and early fibrosis nasal disc -- regressed   C/D Ratio 0.3 0.4   Macula Flat, Good foveal reflex, scattered MA, no edema Flat, Good foveal reflex, scattered Microaneurysms/DBH   Vessels attenuated, Tortuous, copper wiring, early NV attenuated, Tortuous, copper wiring, +NV -- regressing   Periphery Attached, rare MA, early focal NV -- scattered, good  inferior and nasal hemisphere PRP laser changes Attached, scattered MA/DBH, focal patches of fibrosis and NV, good 360 PRP and room for fill in  IMAGING AND PROCEDURES  Imaging and Procedures for @TODAY @  OCT, Retina - OU - Both Eyes       Right Eye Quality was good. Central Foveal Thickness: 304. Progression has improved. Findings include normal foveal contour, no SRF, intraretinal fluid, vitreomacular adhesion (Mild focal IRF/cystic changes temporal macula).   Left Eye Quality was good. Central Foveal Thickness: 303. Progression has been stable. Findings include normal foveal contour, no SRF, intraretinal hyper-reflective material, intraretinal fluid, vitreomacular adhesion (Trace cystic changes superior to fovea and temporal macula; partial PVD and fibrosis over disc).   Notes *Images captured and stored on drive  Diagnosis / Impression:  NFP, no SRF, +IRF OU OD: Mild focal IRF/cystic changes temporal macula -- slightly improved OS: Trace cystic changes superior to fovea and temporal macula; partial PVD and fibrosis over disc  Clinical management:  See below  Abbreviations: NFP - Normal foveal profile. CME - cystoid macular edema. PED - pigment epithelial detachment. IRF - intraretinal fluid. SRF - subretinal fluid. EZ - ellipsoid zone. ERM - epiretinal membrane. ORA - outer retinal atrophy. ORT - outer retinal tubulation. SRHM - subretinal hyper-reflective material      Panretinal Photocoagulation - OS - Left Eye       Anesthesia Anesthetic medications included Proparacaine 0.5%.   Notes LASER PROCEDURE NOTE  Diagnosis:   Proliferative Diabetic Retinopathy, LEFT EYE  Procedure:  Pan-retinal photocoagulation using slit lamp laser, fill-in, LEFT EYE  Anesthesia:  Topical  Surgeon: Rennis Chris, MD, PhD   Informed consent obtained, operative eye marked, and time out performed prior to initiation of laser.   Lumenis [id number] slit lamp laser Pattern:  3x3 square Power: 300 mW Duration: 30 msec  Spot size: 200 microns  # spots: 1506 spots  Complications: None.  RTC: 3 mos - DFE/OCT  Patient tolerated the procedure well and received written and verbal post-procedure care information/education.           ASSESSMENT/PLAN:    ICD-10-CM   1. Proliferative diabetic retinopathy of both eyes with macular edema associated with type 2 diabetes mellitus (HCC)  E11.3513 OCT, Retina - OU - Both Eyes    Panretinal Photocoagulation - OS - Left Eye    2. Long term (current) use of oral hypoglycemic drugs  Z79.84     3. Essential hypertension  I10     4. Hypertensive retinopathy of both eyes  H35.033     5. Combined forms of age-related cataract of both eyes  H25.813     6. Long-term use of Plaquenil  Z79.899     7. Seropositive rheumatoid arthritis (HCC)  M05.9      1,2. Proliferative diabetic retinopathy w/ DME OU  - Pt lost to f/u after initial visit in 2020  - last A1c 7.9 (01.25.23) - FA in 2020 showed NVE OS -- pt was scheduled for PRP OS but never made it back - repeat FA (08.07.24) shows OD: Mild NV greatest IT macula and inferior midzone; OS: Scattered NV 360 midzone, new leakage from disc (mild NVD) -- pt would benefit from PRP OU - exam shows 360 MA OU and now NV OU - s/p PRP OS (08.13.24) - s/p PRP OD (09.13.24), (01.22.25) - repeat FA (12.17.24) shows OD: Mild NV greatest IT macula and inferior mid zone-- interval improvement in leakage, OS: Scattered NV with leakage 360 mid zone slightly improved from prior, new leakage from disc (mild NVD) - improved -- discussed benefit of PRP fill in OU - recommend fill in  PRP OS today, 02.11.25 - pt wishes to proceed with laser OS - RBA of procedure discussed, questions answered - informed consent obtained and signed - see procedure note - start PF QID OS x7 days - f/u 3 months, DFE, OCT  3,4. Hypertensive retinopathy OU - discussed importance of tight BP control -  monitor  5. Mixed form age related cataracts OU  - The symptoms of cataract, surgical options, and treatments and risks were discussed with patient. - discussed diagnosis and progression - monitor  Ophthalmic Meds Ordered this visit:  No orders of the defined types were placed in this encounter.    Return in 3 months (on 12/11/2023) for f/u PDR OU, DFE, OCT.  There are no Patient Instructions on file for this visit.  This document serves as a record of services personally performed by Karie Chimera, MD, PhD. It was created on their behalf by Charlette Caffey, COT an ophthalmic technician. The creation of this record is the provider's dictation and/or activities during the visit.    Electronically signed by:  Charlette Caffey, COT  09/13/23 11:37 AM  This document serves as a record of services personally performed by Karie Chimera, MD, PhD. It was created on their behalf by Glee Arvin. Manson Passey, OA an ophthalmic technician. The creation of this record is the provider's dictation and/or activities during the visit.    Electronically signed by: Glee Arvin. Manson Passey, OA 09/13/23 11:37 AM  Karie Chimera, M.D., Ph.D. Diseases & Surgery of the Retina and Vitreous [facility]  I have reviewed the above documentation for accuracy and completeness, and I agree with the above. Karie Chimera, M.D., Ph.D. 09/13/23 11:37 AM   Abbreviations: M myopia (nearsighted); A astigmatism; H hyperopia (farsighted); P presbyopia; Mrx spectacle prescription;  CTL contact lenses; OD right eye; OS left eye; OU both eyes  XT exotropia; ET esotropia; PEK punctate epithelial keratitis; PEE punctate epithelial erosions; DES dry eye syndrome; MGD meibomian gland dysfunction; ATs artificial tears; PFAT's preservative free artificial tears; NSC nuclear sclerotic cataract; PSC posterior subcapsular cataract; ERM epi-retinal membrane; PVD posterior vitreous detachment; RD retinal detachment; DM  diabetes mellitus; DR diabetic retinopathy; NPDR non-proliferative diabetic retinopathy; PDR proliferative diabetic retinopathy; CSME clinically significant macular edema; DME diabetic macular edema; dbh dot blot hemorrhages; CWS cotton wool spot; POAG primary open angle glaucoma; C/D cup-to-disc ratio; HVF humphrey visual field; GVF goldmann visual field; OCT optical coherence tomography; IOP intraocular pressure; BRVO Branch retinal vein occlusion; CRVO central retinal vein occlusion; CRAO central retinal artery occlusion; BRAO branch retinal artery occlusion; RT retinal tear; SB scleral buckle; PPV pars plana vitrectomy; VH Vitreous hemorrhage; PRP panretinal laser photocoagulation; IVK intravitreal kenalog; VMT vitreomacular traction; MH Macular hole;  NVD neovascularization of the disc; NVE neovascularization elsewhere; AREDS age related eye disease study; ARMD age related macular degeneration; POAG primary open angle glaucoma; EBMD epithelial/anterior basement membrane dystrophy; ACIOL anterior chamber intraocular lens; IOL intraocular lens; PCIOL posterior chamber intraocular lens; Phaco/IOL phacoemulsification with intraocular lens placement; PRK photorefractive keratectomy; LASIK laser assisted in situ keratomileusis; HTN hypertension; DM diabetes mellitus; COPD chronic obstructive pulmonary disease

## 2023-09-12 ENCOUNTER — Encounter: Payer: Self-pay | Admitting: Internal Medicine

## 2023-09-12 ENCOUNTER — Ambulatory Visit: Payer: 59 | Attending: Internal Medicine | Admitting: Internal Medicine

## 2023-09-12 VITALS — BP 101/64 | HR 75 | Resp 14 | Ht 64.0 in | Wt 211.0 lb

## 2023-09-12 DIAGNOSIS — Z79899 Other long term (current) drug therapy: Secondary | ICD-10-CM

## 2023-09-12 DIAGNOSIS — M059 Rheumatoid arthritis with rheumatoid factor, unspecified: Secondary | ICD-10-CM | POA: Diagnosis not present

## 2023-09-12 MED ORDER — FOLIC ACID 1 MG PO TABS: 1.0000 mg | ORAL_TABLET | Freq: Every day | ORAL | 3 refills | Status: DC

## 2023-09-12 MED ORDER — METHOTREXATE SODIUM 2.5 MG PO TABS: 20.0000 mg | ORAL_TABLET | ORAL | 0 refills | Status: DC

## 2023-09-12 MED ORDER — HYDROXYCHLOROQUINE SULFATE 200 MG PO TABS: 200.0000 mg | ORAL_TABLET | Freq: Every day | ORAL | 1 refills | Status: DC

## 2023-09-13 ENCOUNTER — Ambulatory Visit (INDEPENDENT_AMBULATORY_CARE_PROVIDER_SITE_OTHER): Payer: 59 | Admitting: Ophthalmology

## 2023-09-13 ENCOUNTER — Encounter (INDEPENDENT_AMBULATORY_CARE_PROVIDER_SITE_OTHER): Payer: Self-pay | Admitting: Ophthalmology

## 2023-09-13 DIAGNOSIS — H35033 Hypertensive retinopathy, bilateral: Secondary | ICD-10-CM | POA: Diagnosis not present

## 2023-09-13 DIAGNOSIS — Z7984 Long term (current) use of oral hypoglycemic drugs: Secondary | ICD-10-CM | POA: Diagnosis not present

## 2023-09-13 DIAGNOSIS — E113513 Type 2 diabetes mellitus with proliferative diabetic retinopathy with macular edema, bilateral: Secondary | ICD-10-CM

## 2023-09-13 DIAGNOSIS — M059 Rheumatoid arthritis with rheumatoid factor, unspecified: Secondary | ICD-10-CM

## 2023-09-13 DIAGNOSIS — I10 Essential (primary) hypertension: Secondary | ICD-10-CM | POA: Diagnosis not present

## 2023-09-13 DIAGNOSIS — H25813 Combined forms of age-related cataract, bilateral: Secondary | ICD-10-CM

## 2023-09-13 DIAGNOSIS — Z79899 Other long term (current) drug therapy: Secondary | ICD-10-CM

## 2023-09-13 LAB — COMPLETE METABOLIC PANEL WITHOUT GFR
AG Ratio: 1.4 (calc) (ref 1.0–2.5)
ALT: 16 U/L (ref 6–29)
AST: 13 U/L (ref 10–35)
Albumin: 4.2 g/dL (ref 3.6–5.1)
Alkaline phosphatase (APISO): 100 U/L (ref 37–153)
BUN: 14 mg/dL (ref 7–25)
CO2: 27 mmol/L (ref 20–32)
Calcium: 9.4 mg/dL (ref 8.6–10.4)
Chloride: 103 mmol/L (ref 98–110)
Creat: 0.76 mg/dL (ref 0.50–1.05)
Globulin: 3.1 g/dL (ref 1.9–3.7)
Glucose, Bld: 102 mg/dL — ABNORMAL HIGH (ref 65–99)
Potassium: 4.2 mmol/L (ref 3.5–5.3)
Sodium: 138 mmol/L (ref 135–146)
Total Bilirubin: 0.2 mg/dL (ref 0.2–1.2)
Total Protein: 7.3 g/dL (ref 6.1–8.1)
eGFR: 88 mL/min/1.73m2

## 2023-09-13 LAB — CBC WITH DIFFERENTIAL/PLATELET
Absolute Lymphocytes: 1733 {cells}/uL (ref 850–3900)
Absolute Monocytes: 510 {cells}/uL (ref 200–950)
Basophils Absolute: 30 {cells}/uL (ref 0–200)
Basophils Relative: 0.4 %
Eosinophils Absolute: 608 {cells}/uL — ABNORMAL HIGH (ref 15–500)
Eosinophils Relative: 8.1 %
HCT: 35.9 % (ref 35.0–45.0)
Hemoglobin: 11.7 g/dL (ref 11.7–15.5)
MCH: 27.8 pg (ref 27.0–33.0)
MCHC: 32.6 g/dL (ref 32.0–36.0)
MCV: 85.3 fL (ref 80.0–100.0)
MPV: 11 fL (ref 7.5–12.5)
Monocytes Relative: 6.8 %
Neutro Abs: 4620 {cells}/uL (ref 1500–7800)
Neutrophils Relative %: 61.6 %
Platelets: 252 Thousand/uL (ref 140–400)
RBC: 4.21 Million/uL (ref 3.80–5.10)
RDW: 14.8 % (ref 11.0–15.0)
Total Lymphocyte: 23.1 %
WBC: 7.5 Thousand/uL (ref 3.8–10.8)

## 2023-09-13 LAB — SEDIMENTATION RATE: Sed Rate: 33 mm/h — ABNORMAL HIGH (ref 0–30)

## 2023-09-21 DIAGNOSIS — E1122 Type 2 diabetes mellitus with diabetic chronic kidney disease: Secondary | ICD-10-CM | POA: Diagnosis not present

## 2023-09-21 DIAGNOSIS — M05632 Rheumatoid arthritis of left wrist with involvement of other organs and systems: Secondary | ICD-10-CM | POA: Diagnosis not present

## 2023-09-21 DIAGNOSIS — E66813 Obesity, class 3: Secondary | ICD-10-CM | POA: Diagnosis not present

## 2023-09-21 DIAGNOSIS — Z6836 Body mass index (BMI) 36.0-36.9, adult: Secondary | ICD-10-CM | POA: Diagnosis not present

## 2023-09-21 DIAGNOSIS — E7849 Other hyperlipidemia: Secondary | ICD-10-CM | POA: Diagnosis not present

## 2023-09-21 DIAGNOSIS — N181 Chronic kidney disease, stage 1: Secondary | ICD-10-CM | POA: Diagnosis not present

## 2023-09-21 DIAGNOSIS — I10 Essential (primary) hypertension: Secondary | ICD-10-CM | POA: Diagnosis not present

## 2023-09-27 ENCOUNTER — Other Ambulatory Visit: Payer: Self-pay | Admitting: Internal Medicine

## 2023-09-27 DIAGNOSIS — M059 Rheumatoid arthritis with rheumatoid factor, unspecified: Secondary | ICD-10-CM

## 2023-10-13 ENCOUNTER — Other Ambulatory Visit: Payer: Self-pay | Admitting: Internal Medicine

## 2023-10-13 DIAGNOSIS — M059 Rheumatoid arthritis with rheumatoid factor, unspecified: Secondary | ICD-10-CM

## 2023-11-13 ENCOUNTER — Other Ambulatory Visit: Payer: Self-pay | Admitting: Internal Medicine

## 2023-11-13 DIAGNOSIS — M059 Rheumatoid arthritis with rheumatoid factor, unspecified: Secondary | ICD-10-CM

## 2023-11-30 ENCOUNTER — Other Ambulatory Visit: Payer: Self-pay | Admitting: Internal Medicine

## 2023-11-30 DIAGNOSIS — M059 Rheumatoid arthritis with rheumatoid factor, unspecified: Secondary | ICD-10-CM

## 2023-12-01 NOTE — Telephone Encounter (Signed)
 Last Fill: 09/12/2023  Eye exam: 03/10/2023 WNL   Labs: 09/12/2023 Eosinophils 608 Glucose 102  Next Visit: 12/28/2023  Last Visit: 09/12/2023  [id number] rheumatoid arthritis (HCC)   Current Dose per office note 09/12/2023: hydroxychloroquine  200 mg p.o. daily   Okay to refill Plaquenil ?

## 2023-12-14 NOTE — Progress Notes (Signed)
 Office Visit Note  Patient: Brooke Sawyer             Date of Birth: 1960/12/12           MRN: [id number]             PCP: Veda Gerald, MD Referring: Veda Gerald, MD Visit Date: 12/28/2023   Subjective:  Follow-up   Discussed the use of AI scribe software for clinical note transcription with the patient, who gave verbal consent to proceed.  History of Present Illness   Brooke MCKAMEY is a 63 y.o. female here for follow up for seropositive RA on methotrexate  20 mg p.o. weekly and hydroxychloroquine  200 mg daily folic acid  1 mg daily.    There have been no significant changes in her condition since the last visit. She continues to take methotrexate  and hydroxychloroquine  for rheumatoid arthritis without experiencing flare-ups, prolonged morning stiffness, or joint pain. She occasionally uses over-the-counter medications like Tylenol  or ibuprofen for arthritis-related aches and pains, but not regularly.  She recently went on a cruise to [place], [place], and [place], which lasted a week. During the trip, she experienced seasickness and took Dramamine, which was only partially helpful. She gained one pound during the trip.  No recent illnesses since the last visit.    Previous HPI 09/12/2023 CIRA Brooke Sawyer is a 63 y.o. female here for follow up for seropositive RA on methotrexate  20 mg p.o. weekly and hydroxychloroquine  200 mg daily folic acid  1 mg daily.     She has rheumatoid arthritis with no recent flare-ups and no issues with her current medications. Despite a slightly elevated sedimentation rate, she has no new or worsening symptoms, including swelling. She notes that her fingers are naturally 'fat.' There are no significant changes in her condition.   She is undergoing laser treatments for retinal issues related to diabetes, managed by Dr. Karyl Paget. These treatments are proceeding without complications.      Previous HPI 06/10/2023 Brooke Sawyer is a 63 y.o. female here for  follow up for seropositive RA on methotrexate  20 mg p.o. weekly and hydroxychloroquine  200 mg daily folic acid  1 mg daily.  Lab results at previous visit with sedimentation rate 1 point above normal no particular disease activity.  Overall she is doing great with no arthritis flareups and does not have significant daily symptoms.  No prolonged morning stiffness.  She had additional follow-up with Dr. Karyl Paget in September due to proliferative diabetic retinopathy.   Previous HPI 03/10/2023 Brooke Sawyer is a 63 y.o. female here for follow up for seropositive RA on methotrexate  20 mg p.o. weekly and hydroxychloroquine  200 mg daily folic acid  1 mg daily.  She is doing well overall no significant disease flare but not noticing any visible joint swelling.  Does not have any prolonged morning stiffness.  She is working on keeping regular physical activity weight appears stable compared to visit earlier this year.  She missed her original appointment about 2 weeks ago so rescheduled to today.  Had ophthalmology exam looked fine for continuing Plaquenil .   Previous HPI 10/20/2022 DANIJAH Sawyer is a 63 y.o. female here for follow up for seropositive RA on methotrexate  20 mg p.o. weekly and hydroxychloroquine  200 mg daily folic acid  1 mg daily.  Overall arthritis symptoms are doing well since her last visit no new flareups.  Not having prolonged morning stiffness or significant joint pain during winter weather this year.  She has not had  any significant infections since her last visit.  Blood work at previous visit did show mild elevation in sedimentation rate but no particular signs of disease activity.    Previous HPI 06/30/22 Brooke Sawyer is a 63 y.o. female here for follow up for seropositive RA on MTX 20 mg PO weekly and hydroxychloroquine  200 mg daily and folic acid  1 mg daily.  Overall her arthritis symptoms are doing very well since her last visit.  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   Previous HPI 03/30/22 Brooke Sawyer is a 63 y.o. female here for follow up for seropositive RA on MTX 20 mg PO weekly and HCQ 200 mg daily. She feels symptoms are well controlled and no problems with medications. Discussed HCQ screening with her eye doctor at last visit apparently was not specifically addressing this before because it had not been discussed.   Previous HPI 12/16/2021 Brooke Sawyer is a 63 y.o. female here for follow up for seropositive RA on methotrexate  20 mg PO weekly and hydroxychloroquine  200 mg daily. Since our last visit she is doing well no increase in symptoms. No trouble with medications or other health events.   Previous HPI 09/15/2021 Brooke Sawyer is a 63 y.o. female here for follow up for seropositive RA on methotrexate  20 mg PO weekly and hydroxychloroquine  200 mg daily. She feels symptoms are very well controlled. No significant morning stiffness. No interval complications or infections.    Previous HPI 06/17/21 Brooke Sawyer is a 63 y.o. female here for follow up after initial visit for transfer of care for rheumatoid arthritis initially to continue treatment with methotrexate  20 mg PO weekly. Lab tests at initial visit showing ESR of 34 minimal elevation baseline CBC and CMP unremarkable. Since our last visit she feels symptoms are doing pretty well she finished taking the steroids uneventfully. She saw optometrist told her she needs to see the ophthalmologist, says the last visit in 2020 workup was extensive and had a patient cost of $500 so has not been regularly following up with Dr. Karyl Paget recently.   Previous HPI 06/03/21 Brooke Sawyer is a 63 y.o. female here for seropositive, erosive RA previously managed at [facility] rheumatology. She was originally diagnosed in 2019 with symptoms of bilateral finger joints pain and swelling and decreased mobility.  Rheumatology workup showing positive RF, CCP, and erosive disease on xray imaging and she started treatment with methotrexate  and folic acid  with initial 1 week prednisone taper. She has felt overall well controlled disease on this regimen. Some ongoing swelling led to adding hydroxychloroquine  to methotrexate  but had a hard time getting this due to COVID related acces issues so never stayed on it. She changed insurance due to her husband retiring on medicare benefits and unable to follow up with [facility] on new insurance. She is off methotrexate  treatment for several months and has significant finger joint swelling, stiffness, and pain. She gets partial relief with NSAIDs which she currently uses to treated her finger pain and swelling. She broke her 4th and 5th does from a minor trauma with losing her balance. She has never suffered a major bone fracture. She takes daily vitamin D supplementation but reports never having bone density screening for osteoporosis.   Review of Systems  Constitutional:  Negative for fatigue.  HENT:  Negative for mouth sores and mouth dryness.  Eyes:  Negative for dryness.  Respiratory:  Negative for shortness of breath.   Cardiovascular:  Negative for chest pain and palpitations.  Gastrointestinal:  Negative for blood in stool, constipation and diarrhea.  Endocrine: Negative for increased urination.  Genitourinary:  Negative for involuntary urination.  Musculoskeletal:  Negative for joint pain, gait problem, joint pain, joint swelling, myalgias, muscle weakness, morning stiffness, muscle tenderness and myalgias.  Skin:  Negative for color change, rash, hair loss and sensitivity to sunlight.  Allergic/Immunologic: Negative for susceptible to infections.  Neurological:  Negative for dizziness and headaches.  Hematological:  Negative for swollen glands.  Psychiatric/Behavioral:  Negative for depressed mood and sleep disturbance. The patient is not  nervous/anxious.     PMFS History:  Patient Active Problem List   Diagnosis Date Noted   History of colonic polyps 12/01/2022   High risk medication use 06/03/2021   Proliferative diabetic retinopathy of left eye with macular edema associated with type 2 diabetes mellitus (HCC) 05/27/2021   Type 2 diabetes mellitus with diabetic retinopathy (HCC) 05/25/2021   Seropositive rheumatoid arthritis (HCC) 04/14/2018   Atypical squamous cell changes of undetermined significance (ASCUS) on vaginal cytology 08/23/2013   Partial nontraumatic tear of rotator cuff 10/18/2012   Rotator cuff syndrome of right shoulder 10/18/2012    Past Medical History:  Diagnosis Date   Diabetes mellitus without complication (HCC)    Rheumatoid arthritis (HCC)    Rotator cuff tear     Family History  Problem Relation Age of Onset   Diabetes Mother    Heart disease Mother    Diabetes Father    COPD Sister    Hypertension Brother    Murrell Arrant' disease Daughter    Colon cancer Neg Hx    Past Surgical History:  Procedure Laterality Date   COLONOSCOPY N/A 09/27/2012   Procedure: COLONOSCOPY;  Surgeon: Ruby Corporal, MD;  Location: [facility];  Service: Endoscopy;  Laterality: N/A;  1030   COLONOSCOPY WITH PROPOFOL  N/A 12/01/2022   Procedure: COLONOSCOPY WITH PROPOFOL ;  Surgeon: Urban Garden, MD;  Location: [facility];  Service: Gastroenterology;  Laterality: N/A;  11:45AM;ASA 1-2   Dilation and Currettage     POLYPECTOMY  12/01/2022   Procedure: POLYPECTOMY;  Surgeon: Umberto Ganong, Bearl Limes, MD;  Location: [facility];  Service: Gastroenterology;;   TUBAL LIGATION     Social History   Social History Narrative   Not on file   Immunization History  Administered Date(s) Administered   Influenza Inj Mdck Quad Pf 05/12/2018, 04/21/2019   Influenza,inj,Quad PF,6+ Mos 05/07/2017   Influenza-Unspecified 05/11/2021   Moderna SARS-COV2 Booster Vaccination 08/11/2021   Moderna Sars-Covid-2  Vaccination 11/08/2019, 12/04/2019     Objective: Vital Signs: BP 119/68 (BP Location: Left Arm, Patient Position: Sitting, Cuff Size: Normal)   Pulse 73   Resp 14   Ht 5\' 4"  (1.626 m)   Wt 216 lb (98 kg)   LMP 05/25/2016   BMI 37.08 kg/m    Physical Exam Eyes:     Conjunctiva/sclera: Conjunctivae normal.  Cardiovascular:     Rate and Rhythm: Normal rate and regular rhythm.  Pulmonary:     Effort: Pulmonary effort is normal.     Breath sounds: Normal breath sounds.  Lymphadenopathy:     Cervical: No cervical adenopathy.  Skin:    General: Skin is warm and dry.  Neurological:     Mental Status: She is alert.  Psychiatric:        Mood  and Affect: Mood normal.      Musculoskeletal Exam:  Shoulders full ROM no tenderness or swelling Elbows full ROM no tenderness or swelling Wrists full ROM no tenderness or swelling Fingers chronic joint widening at right 1st-3rd MCPs, chronic soft tissue thickening no tenderness to pressure no palpable effusion No paraspinal tenderness to palpation over upper and lower back Hip normal internal and external rotation without pain, no tenderness to lateral hip palpation Knees full ROM no tenderness or swelling, patellofemoral crepitus present  Investigation: No additional findings.  Imaging: OCT, Retina - OU - Both Eyes Result Date: 12/23/2023 Right Eye Quality was good. Central Foveal Thickness: 305. Progression has improved. Findings include normal foveal contour, no IRF, no SRF, vitreomacular adhesion (Mild focal IRF/cystic changes temporal macula -- improved). Left Eye Quality was good. Central Foveal Thickness: 307. Progression has improved. Findings include normal foveal contour, no IRF, no SRF, intraretinal hyper-reflective material, vitreomacular adhesion (Trace cystic changes superior to fovea and temporal macula -- improved; partial PVD and fibrosis over disc). Notes *Images captured and stored on drive Diagnosis / Impression: NFP, no  SRF, +IRF OU OD: Mild focal IRF/cystic changes temporal macula -- improved OS: Trace cystic changes superior to fovea and temporal macula -- improved; partial PVD and fibrosis over disc Clinical management: See below Abbreviations: NFP - Normal foveal profile. CME - cystoid macular edema. PED - pigment epithelial detachment. IRF - intraretinal fluid. SRF - subretinal fluid. EZ - ellipsoid zone. ERM - epiretinal membrane. ORA - outer retinal atrophy. ORT - outer retinal tubulation. SRHM - subretinal hyper-reflective material    Recent Labs: Lab Results  Component Value Date   WBC 7.5 09/12/2023   HGB 11.7 09/12/2023   PLT 252 09/12/2023   NA 138 09/12/2023   K 4.2 09/12/2023   CL 103 09/12/2023   CO2 27 09/12/2023   GLUCOSE 102 (H) 09/12/2023   BUN 14 09/12/2023   CREATININE 0.76 09/12/2023   BILITOT 0.2 09/12/2023   AST 13 09/12/2023   ALT 16 09/12/2023   PROT 7.3 09/12/2023   CALCIUM  9.4 09/12/2023   GFRAA  05/15/2008    >60        The eGFR has been calculated using the MDRD equation. This calculation has not been validated in all clinical    Speciality Comments: PLQ Eye Exam: 03/10/2023 WNL @ [facility] Follow up 1 year  Procedures:  No procedures performed Allergies: Patient has no known allergies.   Assessment / Plan:     Visit Diagnoses: Seropositive rheumatoid arthritis (HCC) - Plan to recheck hand x-rays later in the year to assess for any joint damage with previous mild chronic joint deformity with third digit lateral deviation. - Plan: hydroxychloroquine  (PLAQUENIL ) 200 MG tablet, methotrexate  (RHEUMATREX) 2.5 MG tablet, Sedimentation rate Stable without flare-ups. Mildly elevated sedimentation rate but no clinical signs of active disease. -Checking ESR for disease activity monitoring -Continue methotrexate  20 mg p.o. weekly and folic acid  1 mg p.o. daily -Continue hydroxychloroquine  200 mg p.o. daily -Plan to recheck hand x-rays later in the  year (~03/2024?) to assess for any joint damage with previous mild chronic joint deformity with third digit lateral deviation.  High risk medication use - methotrexate  20 mg p.o. weekly and folic acid  1 mg daily and hydroxychloroquine  200 mg daily. PLQ Eye Exam: 03/10/2023 WNL - Plan: CBC with Differential/Platelet, Comprehensive metabolic panel with GFR Tolerating medicines and no serious interval infection.  Most recent eye exam no concern for  hydroxychloroquine  retinal toxicity. Has close f/u with D.r Zamora for proliferative diabetic retinopathy -Checking CBC and CMP for medication monitoring on continued long-term use of methotrexate  hydroxychloroquine    Orders: Orders Placed This Encounter  Procedures   Sedimentation rate   CBC with Differential/Platelet   Comprehensive metabolic panel with GFR   Meds ordered this encounter  Medications   hydroxychloroquine  (PLAQUENIL ) 200 MG tablet    Sig: Take 1 tablet (200 mg total) by mouth daily.    Dispense:  90 tablet    Refill:  0   methotrexate  (RHEUMATREX) 2.5 MG tablet    Sig: Take 8 tablets (20 mg total) by mouth once a week. Caution:Chemotherapy. Protect from light.    Dispense:  104 tablet    Refill:  0     Follow-Up Instructions: No follow-ups on file.   Matt Song, MD  Note - This record has been created using [employer].  Chart creation errors have been sought, but may not always  have been located. Such creation errors do not reflect on  the standard of medical care.

## 2023-12-15 ENCOUNTER — Ambulatory Visit: Payer: 59 | Admitting: Internal Medicine

## 2023-12-20 DIAGNOSIS — E1122 Type 2 diabetes mellitus with diabetic chronic kidney disease: Secondary | ICD-10-CM | POA: Diagnosis not present

## 2023-12-20 DIAGNOSIS — I10 Essential (primary) hypertension: Secondary | ICD-10-CM | POA: Diagnosis not present

## 2023-12-20 DIAGNOSIS — M05632 Rheumatoid arthritis of left wrist with involvement of other organs and systems: Secondary | ICD-10-CM | POA: Diagnosis not present

## 2023-12-20 DIAGNOSIS — N181 Chronic kidney disease, stage 1: Secondary | ICD-10-CM | POA: Diagnosis not present

## 2023-12-20 DIAGNOSIS — Z6836 Body mass index (BMI) 36.0-36.9, adult: Secondary | ICD-10-CM | POA: Diagnosis not present

## 2023-12-20 DIAGNOSIS — E7849 Other hyperlipidemia: Secondary | ICD-10-CM | POA: Diagnosis not present

## 2023-12-20 DIAGNOSIS — E66813 Obesity, class 3: Secondary | ICD-10-CM | POA: Diagnosis not present

## 2023-12-21 NOTE — Progress Notes (Signed)
 [facility] - Clinic Note  12/23/2023     CHIEF COMPLAINT Patient presents for Retina Follow Up   HISTORY OF PRESENT ILLNESS: Brooke Sawyer is a 63 y.o. female who presents to the clinic today for:   HPI     Retina Follow Up   Patient presents with  Diabetic Retinopathy.  In both eyes.  This started 3 weeks ago.  Duration of 3 weeks.  Since onset it is stable.  I, the attending physician,  performed the HPI with the patient and updated documentation appropriately.        Comments   Patient feels the vision is the same. She is not using eye drops. Her blood sugar was 110.      Last edited by Ronelle Coffee, MD on 12/23/2023 12:40 PM.     Referring physician: Lucendia Rusk, DO [address] [place],  [place] [address]  HISTORICAL INFORMATION:   Selected notes from the MEDICAL RECORD NUMBER    CURRENT MEDICATIONS: No current outpatient medications on file. (Ophthalmic Drugs)   No current facility-administered medications for this visit. (Ophthalmic Drugs)   Current Outpatient Medications (Other)  Medication Sig   atorvastatin  (LIPITOR) 20 MG tablet TAKE 1 TABLET BY MOUTH EVERYDAY AT BEDTIME   Cholecalciferol (VITAMIN D) 50 MCG (2000 UT) tablet Take 2,000 Units by mouth daily.   Cyanocobalamin  (B-12 PO) Take 1,000 mcg by mouth daily.   folic acid  (FOLVITE ) 1 MG tablet Take 1 tablet (1 mg total) by mouth daily.   glipiZIDE  (GLUCOTROL ) 10 MG tablet Take 10 mg by mouth 2 (two) times daily.   hydroxychloroquine  (PLAQUENIL ) 200 MG tablet TAKE 1 TABLET BY MOUTH EVERY DAY (MAX OF 30 DAYS ON INSURANCE)   JARDIANCE 25 MG TABS tablet Take 25 mg by mouth every morning.   levocetirizine (XYZAL) 5 MG tablet Take 5 mg by mouth daily as needed for allergies.   losartan (COZAAR) 25 MG tablet Take 25 mg by mouth daily.   metFORMIN (GLUCOPHAGE) 500 MG tablet TAKE 2 TABLETS BY MOUTH TWICE A DAY WITH MORNING AND EVENING MEALS (Patient taking differently: Take 500-1,000  mg by mouth See admin instructions. Take 1000 mg in the morning and 500 mg in the evening)   methotrexate  (RHEUMATREX) 2.5 MG tablet Take 8 tablets (20 mg total) by mouth once a week. Caution:Chemotherapy. Protect from light.   Omega-3 Fatty Acids (FISH OIL OMEGA-3) 1000 MG CAPS Take 1,000 mg by mouth daily.   No current facility-administered medications for this visit. (Other)   REVIEW OF SYSTEMS: ROS   Positive for: Endocrine, Eyes Negative for: Constitutional, Gastrointestinal, Neurological, Skin, Genitourinary, Musculoskeletal, HENT, Cardiovascular, Respiratory, Psychiatric, Allergic/Imm, Heme/Lymph Last edited by Olene Berne, COT on 12/23/2023  7:37 AM.      ALLERGIES No Known Allergies  PAST MEDICAL HISTORY Past Medical History:  Diagnosis Date   Diabetes mellitus without complication (HCC)    Rheumatoid arthritis (HCC)    Rotator cuff tear    Past Surgical History:  Procedure Laterality Date   COLONOSCOPY N/A 09/27/2012   Procedure: COLONOSCOPY;  Surgeon: Ruby Corporal, MD;  Location: [facility];  Service: Endoscopy;  Laterality: N/A;  1030   COLONOSCOPY WITH PROPOFOL  N/A 12/01/2022   Procedure: COLONOSCOPY WITH PROPOFOL ;  Surgeon: Urban Garden, MD;  Location: [facility];  Service: Gastroenterology;  Laterality: N/A;  11:45AM;ASA 1-2   Dilation and Currettage     POLYPECTOMY  12/01/2022   Procedure: POLYPECTOMY;  Surgeon:  Umberto Ganong, Bearl Limes, MD;  Location: [facility];  Service: Gastroenterology;;   TUBAL LIGATION     FAMILY HISTORY Family History  Problem Relation Age of Onset   Diabetes Mother    Heart disease Mother    Diabetes Father    COPD Sister    Hypertension Brother    Graves' disease Daughter    Colon cancer Neg Hx    SOCIAL HISTORY Social History   Tobacco Use   Smoking status: Former    Current packs/day: 0.00    Average packs/day: 0.5 packs/day for 6.0 years (3.0 ttl pk-yrs)    Types: Cigarettes    Start date:  43    Quit date: 47    Years since quitting: 33.4    Passive exposure: Never   Smokeless tobacco: Never  Vaping Use   Vaping status: Never Used  Substance Use Topics   Alcohol use: No   Drug use: No       OPHTHALMIC EXAM:   Base Eye Exam     Visual Acuity (Snellen - Linear)       Right Left   Dist [place] 20/25 +2 20/20   Dist ph [place] NI          Tonometry (Tonopen, 7:40 AM)       Right Left   Pressure 13 16         Pupils       Dark Light Shape React APD   Right 3 2 Round Brisk None   Left 3 2 Round Brisk None         Visual Fields       Left Right    Full Full         Extraocular Movement       Right Left    Full, Ortho Full, Ortho         Neuro/Psych     Oriented x3: Yes   Mood/Affect: Normal         Dilation     Both eyes: 2.5% Phenylephrine, 1.0% Mydriacyl @ 7:38 AM           Slit Lamp and Fundus Exam     Slit Lamp Exam       Right Left   Lids/Lashes Normal Normal   Conjunctiva/Sclera mild melanosis Trace Melanosis   Cornea trace Arcus, trace Punctate epithelial erosions trace Arcus, trace Punctate epithelial erosions   Anterior Chamber Deep and quiet Deep and quiet   Iris Round and dilated, No NVI Round and dilated, No NVI   Lens 2+ Nuclear sclerosis with early brunescense, 2+ Cortical cataract 2+ Nuclear sclerosis with mild brunescense, 2+ Cortical cataract   Anterior Vitreous trace Vitreous syneresis trace Vitreous syneresis         Fundus Exam       Right Left   Disc Pink and Sharp, no NVD Pink and Sharp, +fine NVD and early fibrosis nasal disc -- regressed   C/D Ratio 0.3 0.4   Macula Flat, Good foveal reflex, scattered MA, no edema Flat, Good foveal reflex, scattered Microaneurysms   Vessels attenuated, Tortuous, early NV -- regressing attenuated, Tortuous, +NV -- regressing   Periphery Attached, rare MA, early focal NV -- scattered -- regressing, good 360 PRP with good fill in changes Attached, scattered  MA/DBH, focal patches of fibrosis and NV, good 360 PRP with good fill in changes            IMAGING AND PROCEDURES  Imaging and Procedures  for @TODAY @  OCT, Retina - OU - Both Eyes        Right Eye Quality was good. Central Foveal Thickness: 305. Progression has improved. Findings include normal foveal contour, no IRF, no SRF, vitreomacular adhesion (Mild focal IRF/cystic changes temporal macula -- improved).   Left Eye Quality was good. Central Foveal Thickness: 307. Progression has improved. Findings include normal foveal contour, no IRF, no SRF, intraretinal hyper-reflective material, vitreomacular adhesion (Trace cystic changes superior to fovea and temporal macula -- improved; partial PVD and fibrosis over disc).   Notes  *Images captured and stored on drive  Diagnosis / Impression:  NFP, no SRF, +IRF OU OD: Mild focal IRF/cystic changes temporal macula -- improved OS: Trace cystic changes superior to fovea and temporal macula -- improved; partial PVD and fibrosis over disc  Clinical management:  See below  Abbreviations: NFP - Normal foveal profile. CME - cystoid macular edema. PED - pigment epithelial detachment. IRF - intraretinal fluid. SRF - subretinal fluid. EZ - ellipsoid zone. ERM - epiretinal membrane. ORA - outer retinal atrophy. ORT - outer retinal tubulation. SRHM - subretinal hyper-reflective material             ASSESSMENT/PLAN:    ICD-10-CM   1. Proliferative diabetic retinopathy of both eyes with macular edema associated with type 2 diabetes mellitus (HCC)  E11.3513 OCT, Retina - OU - Both Eyes    2. Long term (current) use of oral hypoglycemic drugs  Z79.84     3. Essential hypertension  I10     4. Hypertensive retinopathy of both eyes  H35.033     5. Combined forms of age-related cataract of both eyes  H25.813      1,2. Proliferative diabetic retinopathy w/ DME OU  - Pt lost to f/u after initial visit in 2020  - last A1c 7.9  (01.25.23) - FA in 2020 showed NVE OS -- pt was scheduled for PRP OS but never made it back - repeat FA (08.07.24) shows OD: Mild NV greatest IT macula and inferior midzone; OS: Scattered NV 360 midzone, new leakage from disc (mild NVD) -- pt would benefit from PRP OU - repeat FA (12.17.24) shows OD: Mild NV greatest IT macula and inferior mid zone-- interval improvement in leakage, OS: Scattered NV with leakage 360 mid zone slightly improved from prior, new leakage from disc (mild NVD) - improved -- discussed benefit of PRP fill in OU - s/p PRP OS (08.13.24), (02.11.25) - s/p PRP OD (09.13.24), (01.22.25) - f/u 4-6 months, DFE, OCT, FA (transit OS)  3,4. Hypertensive retinopathy OU - discussed importance of tight BP control - monitor  5. Mixed form age related cataracts OU  - The symptoms of cataract, surgical options, and treatments and risks were discussed with patient. - discussed diagnosis and progression - monitor  Ophthalmic Meds Ordered this visit:  No orders of the defined types were placed in this encounter.    Return for f/u 4-6 months, PDR OU, DFE, OCT, FA (transit OS).  There are no Patient Instructions on file for this visit.  This document serves as a record of services personally performed by Jeanice Millard, MD, PhD. It was created on their behalf by Eller Gut COT, an ophthalmic technician. The creation of this record is the provider's dictation and/or activities during the visit.    Electronically signed by: Eller Gut COT 05.21.25 12:42 PM  This document serves as a record of services personally performed by Jeanice Millard, MD, PhD.  It was created on their behalf by Morley Arabia. Bevin Bucks, OA an ophthalmic technician. The creation of this record is the provider's dictation and/or activities during the visit.    Electronically signed by: Morley Arabia. Bevin Bucks, OA 12/23/23 12:42 PM  Jeanice Millard, M.D., Ph.D. Diseases & Surgery of the Retina and  Vitreous [facility] 12/23/2023   I have reviewed the above documentation for accuracy and completeness, and I agree with the above. Jeanice Millard, M.D., Ph.D. 12/23/23 12:44 PM   Abbreviations: M myopia (nearsighted); A astigmatism; H hyperopia (farsighted); P presbyopia; Mrx spectacle prescription;  CTL contact lenses; OD right eye; OS left eye; OU both eyes  XT exotropia; ET esotropia; PEK punctate epithelial keratitis; PEE punctate epithelial erosions; DES dry eye syndrome; MGD meibomian gland dysfunction; ATs artificial tears; PFAT's preservative free artificial tears; NSC nuclear sclerotic cataract; PSC posterior subcapsular cataract; ERM epi-retinal membrane; PVD posterior vitreous detachment; RD retinal detachment; DM diabetes mellitus; DR diabetic retinopathy; NPDR non-proliferative diabetic retinopathy; PDR proliferative diabetic retinopathy; CSME clinically significant macular edema; DME diabetic macular edema; dbh dot blot hemorrhages; CWS cotton wool spot; POAG primary open angle glaucoma; C/D cup-to-disc ratio; HVF humphrey visual field; GVF goldmann visual field; OCT optical coherence tomography; IOP intraocular pressure; BRVO Branch retinal vein occlusion; CRVO central retinal vein occlusion; CRAO central retinal artery occlusion; BRAO branch retinal artery occlusion; RT retinal tear; SB scleral buckle; PPV pars plana vitrectomy; VH Vitreous hemorrhage; PRP panretinal laser photocoagulation; IVK intravitreal kenalog; VMT vitreomacular traction; MH Macular hole;  NVD neovascularization of the disc; NVE neovascularization elsewhere; AREDS age related eye disease study; ARMD age related macular degeneration; POAG primary open angle glaucoma; EBMD epithelial/anterior basement membrane dystrophy; ACIOL anterior chamber intraocular lens; IOL intraocular lens; PCIOL posterior chamber intraocular lens; Phaco/IOL phacoemulsification with intraocular lens placement; PRK  photorefractive keratectomy; LASIK laser assisted in situ keratomileusis; HTN hypertension; DM diabetes mellitus; COPD chronic obstructive pulmonary disease

## 2023-12-23 ENCOUNTER — Ambulatory Visit (INDEPENDENT_AMBULATORY_CARE_PROVIDER_SITE_OTHER): Payer: 59 | Admitting: Ophthalmology

## 2023-12-23 ENCOUNTER — Encounter (INDEPENDENT_AMBULATORY_CARE_PROVIDER_SITE_OTHER): Payer: Self-pay | Admitting: Ophthalmology

## 2023-12-23 DIAGNOSIS — H25813 Combined forms of age-related cataract, bilateral: Secondary | ICD-10-CM | POA: Diagnosis not present

## 2023-12-23 DIAGNOSIS — I10 Essential (primary) hypertension: Secondary | ICD-10-CM

## 2023-12-23 DIAGNOSIS — E113513 Type 2 diabetes mellitus with proliferative diabetic retinopathy with macular edema, bilateral: Secondary | ICD-10-CM | POA: Diagnosis not present

## 2023-12-23 DIAGNOSIS — Z7984 Long term (current) use of oral hypoglycemic drugs: Secondary | ICD-10-CM

## 2023-12-23 DIAGNOSIS — H35033 Hypertensive retinopathy, bilateral: Secondary | ICD-10-CM | POA: Diagnosis not present

## 2023-12-28 ENCOUNTER — Ambulatory Visit: Payer: 59 | Attending: Internal Medicine | Admitting: Internal Medicine

## 2023-12-28 ENCOUNTER — Encounter: Payer: Self-pay | Admitting: Internal Medicine

## 2023-12-28 VITALS — BP 119/68 | HR 73 | Resp 14 | Ht 64.0 in | Wt 216.0 lb

## 2023-12-28 DIAGNOSIS — Z79899 Other long term (current) drug therapy: Secondary | ICD-10-CM

## 2023-12-28 DIAGNOSIS — M059 Rheumatoid arthritis with rheumatoid factor, unspecified: Secondary | ICD-10-CM

## 2023-12-28 MED ORDER — METHOTREXATE SODIUM 2.5 MG PO TABS
20.0000 mg | ORAL_TABLET | ORAL | 0 refills | Status: DC
Start: 2023-12-28 — End: 2024-02-27

## 2023-12-28 MED ORDER — HYDROXYCHLOROQUINE SULFATE 200 MG PO TABS
200.0000 mg | ORAL_TABLET | Freq: Every day | ORAL | 0 refills | Status: DC
Start: 2023-12-28 — End: 2024-04-05

## 2023-12-29 LAB — CBC WITH DIFFERENTIAL/PLATELET
Absolute Lymphocytes: 1321 {cells}/uL (ref 850–3900)
Absolute Monocytes: 527 {cells}/uL (ref 200–950)
Basophils Absolute: 50 {cells}/uL (ref 0–200)
Basophils Relative: 0.8 %
Eosinophils Absolute: 347 {cells}/uL (ref 15–500)
Eosinophils Relative: 5.6 %
HCT: 32.7 % — ABNORMAL LOW (ref 35.0–45.0)
Hemoglobin: 10.4 g/dL — ABNORMAL LOW (ref 11.7–15.5)
MCH: 28.3 pg (ref 27.0–33.0)
MCHC: 31.8 g/dL — ABNORMAL LOW (ref 32.0–36.0)
MCV: 88.9 fL (ref 80.0–100.0)
MPV: 11.8 fL (ref 7.5–12.5)
Monocytes Relative: 8.5 %
Neutro Abs: 3956 {cells}/uL (ref 1500–7800)
Neutrophils Relative %: 63.8 %
Platelets: 219 10*3/uL (ref 140–400)
RBC: 3.68 10*6/uL — ABNORMAL LOW (ref 3.80–5.10)
RDW: 14.4 % (ref 11.0–15.0)
Total Lymphocyte: 21.3 %
WBC: 6.2 10*3/uL (ref 3.8–10.8)

## 2023-12-29 LAB — COMPREHENSIVE METABOLIC PANEL WITH GFR
AG Ratio: 1.2 (calc) (ref 1.0–2.5)
ALT: 17 U/L (ref 6–29)
AST: 15 U/L (ref 10–35)
Albumin: 3.7 g/dL (ref 3.6–5.1)
Alkaline phosphatase (APISO): 101 U/L (ref 37–153)
BUN: 12 mg/dL (ref 7–25)
CO2: 29 mmol/L (ref 20–32)
Calcium: 8.9 mg/dL (ref 8.6–10.4)
Chloride: 105 mmol/L (ref 98–110)
Creat: 0.65 mg/dL (ref 0.50–1.05)
Globulin: 3 g/dL (ref 1.9–3.7)
Glucose, Bld: 263 mg/dL — ABNORMAL HIGH (ref 65–99)
Potassium: 4.4 mmol/L (ref 3.5–5.3)
Sodium: 139 mmol/L (ref 135–146)
Total Bilirubin: 0.2 mg/dL (ref 0.2–1.2)
Total Protein: 6.7 g/dL (ref 6.1–8.1)
eGFR: 98 mL/min/{1.73_m2} (ref >=60)

## 2023-12-29 LAB — SEDIMENTATION RATE: Sed Rate: 28 mm/h (ref 0–30)

## 2024-02-24 ENCOUNTER — Other Ambulatory Visit: Payer: Self-pay | Admitting: Internal Medicine

## 2024-02-24 DIAGNOSIS — M059 Rheumatoid arthritis with rheumatoid factor, unspecified: Secondary | ICD-10-CM

## 2024-02-24 NOTE — Telephone Encounter (Signed)
 Last Fill: 12/28/2023  Labs: 12/28/2023  Glucose 263 RBC 3.68 Hemoglobin 10.4 HCT 32.7 MCHC 31.8   Next Visit: 04/05/2024  Last Visit: 12/28/2023  DX: Seropositive rheumatoid arthritis   Current Dose per office note 12/28/2023: methotrexate  20 mg p.o. weekly   Okay to refill Methotrexate ?

## 2024-03-22 NOTE — Progress Notes (Signed)
 Office Visit Note  Patient: Brooke Sawyer             Date of Birth: 1960/12/04           MRN: [id number]             PCP: Orpha Yancey LABOR, MD Referring: Orpha Yancey LABOR, MD Visit Date: 04/05/2024   Subjective:  History of Present Illness:   Discussed the use of AI scribe software for clinical note transcription with the patient, who gave verbal consent to proceed.  History of Present Illness   Brooke Sawyer is a 63 y.o. female here for follow up for seropositive RA on methotrexate  20 mg p.o. weekly and hydroxychloroquine  200 mg daily folic acid  1 mg daily.     Approximately one week ago, she experienced pain and an inability to bend her right fourth finger. The pain was localized to the PIP joint and was described as very sore, even to touch. The inability to bend the finger did not affect her other fingers.  The symptoms lasted for a couple of days, during which she attempted to exercise the finger and applied topical treatments. The pain and stiffness resolved spontaneously after a few days, although there is still slight residual soreness in the finger.  No recent illness or unusual activity recalled that could have precipitated the symptoms.  No other joint pain or stiffness reported. No locking or catching of the affected finger persisting at this time.       Previous HPI 12/28/2023 Brooke Sawyer is a 63 y.o. female here for follow up for seropositive RA on methotrexate  20 mg p.o. weekly and hydroxychloroquine  200 mg daily folic acid  1 mg daily.     There have been no significant changes in her condition since the last visit. She continues to take methotrexate  and hydroxychloroquine  for rheumatoid arthritis without experiencing flare-ups, prolonged morning stiffness, or joint pain. She occasionally uses over-the-counter medications like Tylenol  or ibuprofen for arthritis-related aches and pains, but not regularly.   She recently went on a cruise to [place], [place], and [place],  which lasted a week. During the trip, she experienced seasickness and took Dramamine, which was only partially helpful. She gained one pound during the trip.   No recent illnesses since the last visit.      Previous HPI 09/12/2023 Brooke Sawyer is a 63 y.o. female here for follow up for seropositive RA on methotrexate  20 mg p.o. weekly and hydroxychloroquine  200 mg daily folic acid  1 mg daily.     She has rheumatoid arthritis with no recent flare-ups and no issues with her current medications. Despite a slightly elevated sedimentation rate, she has no new or worsening symptoms, including swelling. She notes that her fingers are naturally 'fat.' There are no significant changes in her condition.   She is undergoing laser treatments for retinal issues related to diabetes, managed by Dr. Valdemar. These treatments are proceeding without complications.      Previous HPI 06/10/2023 Brooke Sawyer is a 63 y.o. female here for follow up for seropositive RA on methotrexate  20 mg p.o. weekly and hydroxychloroquine  200 mg daily folic acid  1 mg daily.  Lab results at previous visit with sedimentation rate 1 point above normal no particular disease activity.  Overall she is doing great with no arthritis flareups and does not have significant daily symptoms.  No prolonged morning stiffness.  She had additional follow-up with Dr. Valdemar in September due to proliferative diabetic retinopathy.  Previous HPI 03/10/2023 Brooke Sawyer is a 63 y.o. female here for follow up for seropositive RA on methotrexate  20 mg p.o. weekly and hydroxychloroquine  200 mg daily folic acid  1 mg daily.  She is doing well overall no significant disease flare but not noticing any visible joint swelling.  Does not have any prolonged morning stiffness.  She is working on keeping regular physical activity weight appears stable compared to visit earlier this year.  She missed her original appointment about 2 weeks ago so rescheduled to today.   Had ophthalmology exam looked fine for continuing Plaquenil .   Previous HPI 10/20/2022 Brooke Sawyer is a 63 y.o. female here for follow up for seropositive RA on methotrexate  20 mg p.o. weekly and hydroxychloroquine  200 mg daily folic acid  1 mg daily.  Overall arthritis symptoms are doing well since her last visit no new flareups.  Not having prolonged morning stiffness or significant joint pain during winter weather this year.  She has not had any significant infections since her last visit.  Blood work at previous visit did show mild elevation in sedimentation rate but no particular signs of disease activity.    Previous HPI 06/30/22 Brooke Sawyer is a 63 y.o. female here for follow up for seropositive RA on MTX 20 mg PO weekly and hydroxychloroquine  200 mg daily and folic acid  1 mg daily.  Overall her arthritis symptoms are doing very well since her last visit.  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   Previous HPI 03/30/22 Brooke Sawyer is a 63 y.o. female here for follow up for seropositive RA on MTX 20 mg PO weekly and HCQ 200 mg daily. She feels symptoms are well controlled and no problems with medications. Discussed HCQ screening with her eye doctor at last visit apparently was not specifically addressing this before because it had not been discussed.   Previous HPI 12/16/2021 Brooke Sawyer is a 63 y.o. female here for follow up for seropositive RA on methotrexate  20 mg PO weekly and hydroxychloroquine  200 mg daily. Since our last visit she is doing well no increase in symptoms. No trouble with medications or other health events.   Previous HPI 09/15/2021 Brooke Sawyer is a 63 y.o. female here for follow up for seropositive RA on methotrexate  20 mg PO weekly and hydroxychloroquine  200 mg daily. She feels symptoms are very well controlled. No significant morning stiffness. No  interval complications or infections.    Previous HPI 06/17/21 Brooke Sawyer is a 63 y.o. female here for follow up after initial visit for transfer of care for rheumatoid arthritis initially to continue treatment with methotrexate  20 mg PO weekly. Lab tests at initial visit showing ESR of 34 minimal elevation baseline CBC and CMP unremarkable. Since our last visit she feels symptoms are doing pretty well she finished taking the steroids uneventfully. She saw optometrist told her she needs to see the ophthalmologist, says the last visit in 2020 workup was extensive and had a patient cost of $500 so has not been regularly following up with Dr. Valdemar recently.   Previous HPI 06/03/21 Brooke Sawyer is a 63 y.o. female here for seropositive, erosive RA previously managed at [facility] rheumatology. She was originally diagnosed in 2019 with symptoms of bilateral finger joints pain and swelling and decreased mobility. Rheumatology workup showing positive RF, CCP, and erosive disease on  xray imaging and she started treatment with methotrexate  and folic acid  with initial 1 week prednisone taper. She has felt overall well controlled disease on this regimen. Some ongoing swelling led to adding hydroxychloroquine  to methotrexate  but had a hard time getting this due to COVID related acces issues so never stayed on it. She changed insurance due to her husband retiring on medicare benefits and unable to follow up with [facility] on new insurance. She is off methotrexate  treatment for several months and has significant finger joint swelling, stiffness, and pain. She gets partial relief with NSAIDs which she currently uses to treated her finger pain and swelling. She broke her 4th and 5th does from a minor trauma with losing her balance. She has never suffered a major bone fracture. She takes daily vitamin D supplementation but reports never having bone density screening for osteoporosis.  Review of Systems   Constitutional:  Negative for fatigue.  HENT:  Negative for mouth sores and mouth dryness.   Eyes:  Negative for dryness.  Respiratory:  Negative for shortness of breath.   Cardiovascular:  Negative for chest pain and palpitations.  Gastrointestinal:  Negative for blood in stool, constipation and diarrhea.  Endocrine: Negative for increased urination.  Genitourinary:  Negative for involuntary urination.  Musculoskeletal:  Negative for joint pain, gait problem, joint pain, joint swelling, myalgias, muscle weakness, morning stiffness, muscle tenderness and myalgias.  Skin:  Negative for color change, rash, hair loss and sensitivity to sunlight.  Allergic/Immunologic: Negative for susceptible to infections.  Neurological:  Negative for dizziness and headaches.  Hematological:  Negative for swollen glands.  Psychiatric/Behavioral:  Negative for depressed mood and sleep disturbance. The patient is not nervous/anxious.     PMFS History:  Patient Active Problem List   Diagnosis Date Noted   History of colonic polyps 12/01/2022   High risk medication use 06/03/2021   Proliferative diabetic retinopathy of left eye with macular edema associated with type 2 diabetes mellitus (HCC) 05/27/2021   Type 2 diabetes mellitus with diabetic retinopathy (HCC) 05/25/2021   Seropositive rheumatoid arthritis (HCC) 04/14/2018   Atypical squamous cell changes of undetermined significance (ASCUS) on vaginal cytology 08/23/2013   Partial nontraumatic tear of rotator cuff 10/18/2012   Rotator cuff syndrome of right shoulder 10/18/2012    Past Medical History:  Diagnosis Date   Diabetes mellitus without complication (HCC)    Rheumatoid arthritis (HCC)    Rotator cuff tear     Family History  Problem Relation Age of Onset   Diabetes Mother    Heart disease Mother    Diabetes Father    COPD Sister    Hypertension Brother    Yvone' disease Daughter    Colon cancer Neg Hx    Past Surgical History:   Procedure Laterality Date   COLONOSCOPY N/A 09/27/2012   Procedure: COLONOSCOPY;  Surgeon: Claudis RAYMOND Rivet, MD;  Location: [facility];  Service: Endoscopy;  Laterality: N/A;  1030   COLONOSCOPY WITH PROPOFOL  N/A 12/01/2022   Procedure: COLONOSCOPY WITH PROPOFOL ;  Surgeon: Eartha Angelia Sieving, MD;  Location: [facility];  Service: Gastroenterology;  Laterality: N/A;  11:45AM;ASA 1-2   Dilation and Currettage     POLYPECTOMY  12/01/2022   Procedure: POLYPECTOMY;  Surgeon: Eartha Angelia Sieving, MD;  Location: [facility];  Service: Gastroenterology;;   TUBAL LIGATION     Social History   Social History Narrative   Not on file   Immunization History  Administered Date(s) Administered  Influenza Inj Mdck Quad Pf 05/12/2018, 04/21/2019   Influenza,inj,Quad PF,6+ Mos 05/07/2017   Influenza-Unspecified 05/11/2021   Moderna SARS-COV2 Booster Vaccination 08/11/2021   Moderna Sars-Covid-2 Vaccination 11/08/2019, 12/04/2019     Objective: Vital Signs: BP 119/65 (BP Location: Left Arm, Patient Position: Sitting, Cuff Size: Normal)   Pulse 69   Resp 15   Ht 5' 4 (1.626 m)   Wt 223 lb 12.8 oz (101.5 kg)   LMP 05/25/2016   BMI 38.42 kg/m    Physical Exam Constitutional:      Appearance: She is obese.  Eyes:     Conjunctiva/sclera: Conjunctivae normal.  Cardiovascular:     Rate and Rhythm: Normal rate and regular rhythm.  Pulmonary:     Effort: Pulmonary effort is normal.     Breath sounds: Normal breath sounds.  Lymphadenopathy:     Cervical: No cervical adenopathy.  Skin:    General: Skin is warm and dry.     Findings: No rash.  Neurological:     Mental Status: She is alert.  Psychiatric:        Mood and Affect: Mood normal.      Musculoskeletal Exam:  Shoulders full ROM no tenderness or swelling Elbows full ROM no tenderness or swelling Wrists full ROM no tenderness or swelling Fingers bony changes 1st-3rd MCPs, chronic soft tissue thickening no  tenderness to pressure no palpable effusion No paraspinal tenderness to palpation over upper and lower back Hip normal internal and external rotation without pain, no tenderness to lateral hip palpation Knees full ROM no tenderness or swelling, patellofemoral crepitus present  Investigation: No additional findings.  Imaging: XR Hand 2 View Right Result Date: 04/05/2024 X-ray right hand 2 views Radiocarpal joint space appears normal. [place] in ulnocarpal space.  Carpal joint spaces appear normal multiple cystic changes present most around the triscaphe joint.  Bone spurring present at MCP PIP and DIP joints.  Some periosteal reaction and proximal phalanges.  Bone mineralization appears normal.  Previous x-ray September 2019 report available but images not available for personal review. Impression Degenerative arthritis at wrist, MCPs, and interphalangeal joints. No acute abnormality 4th PIP joint. No definite erosions and no demineralization to suggest RA progression.  XR Hand 2 View Left Result Date: 04/05/2024 X-ray left hand 2 views Radiocarpal joint space appears normal.  Carpal joint spaces appear normal multiple cystic changes present most around the triscaphe joint.  Bone spurring present at MCP PIP and DIP joints.  Some periosteal reaction and proximal phalanges.  Small cystic changes at first MCP joint with possibly 1 small periarticular erosion.  Bone mineralization appears normal.  Previous x-ray September 2019 report available but images not available for personal review. Impression Degenerative arthritis at wrist, MCPs, and interphalangeal joints.  Probably cystic change at 1st MCP joint. No definite erosions and no demineralization to suggest RA progression.   Recent Labs: Lab Results  Component Value Date   WBC 6.2 12/28/2023   HGB 10.4 (L) 12/28/2023   PLT 219 12/28/2023   NA 139 12/28/2023   K 4.4 12/28/2023   CL 105 12/28/2023   CO2 29 12/28/2023   GLUCOSE 263 (H) 12/28/2023    BUN 12 12/28/2023   CREATININE 0.65 12/28/2023   BILITOT 0.2 12/28/2023   AST 15 12/28/2023   ALT 17 12/28/2023   PROT 6.7 12/28/2023   CALCIUM  8.9 12/28/2023   GFRAA  05/15/2008    >60        The eGFR has been calculated using  the MDRD equation. This calculation has not been validated in all clinical    Speciality Comments: PLQ Eye Exam: 03/10/2023 WNL @ [facility] Follow up 1 year  Procedures:  No procedures performed Allergies: Patient has no known allergies.   Assessment / Plan:     Visit Diagnoses: Seropositive rheumatoid arthritis (HCC) - Plan: Sedimentation rate, XR Hand 2 View Right, XR Hand 2 View Left, hydroxychloroquine  (PLAQUENIL ) 200 MG tablet, methotrexate  (RHEUMATREX) 2.5 MG tablet Chronic condition managed with methotrexate  and hydroxychloroquine . No changes in medication. Normal sedimentation rate indicates good inflammation control. Recent finger pain not sure if this is related to RA disease activity since previously her flareups have usually been involving at least multiple joints. -Checking ESR for disease activity monitoring -Continue methotrexate  20 mg p.o. weekly and folic acid  1 mg p.o. daily -Continue hydroxychloroquine  200 mg p.o. daily -Rechecking hand xrays today for any radiographic progression or new changes with recent symptoms possible flare up  High risk medication use - methotrexate  20 mg p.o. weekly and folic acid  1 mg daily and hydroxychloroquine  200 mg daily. - Plan: CBC with Differential/Platelet, Comprehensive metabolic panel with GFR Tolerating medicines and no serious interval infection.  Most recent eye exam no concern for hydroxychloroquine  retinal toxicity. Has close f/u with Dr. Valdemar for proliferative diabetic retinopathy -Checking CBC and CMP for medication monitoring on continued long-term use of methotrexate  hydroxychloroquine   Seropositive rheumatoid arthritis (HCC) - Plan to recheck hand x-rays later in  the year to assess for any joint damage with previous mild chronic joint deformity with third digit lateral deviation. - Plan: Sedimentation rate, XR Hand 2 View Right, XR Hand 2 View Left, hydroxychloroquine  (PLAQUENIL ) 200 MG tablet, methotrexate  (RHEUMATREX) 2.5 MG tablet   Orders: Orders Placed This Encounter  Procedures   XR Hand 2 View Right   XR Hand 2 View Left   Sedimentation rate   CBC with Differential/Platelet   Comprehensive metabolic panel with GFR   Meds ordered this encounter  Medications   hydroxychloroquine  (PLAQUENIL ) 200 MG tablet    Sig: Take 1 tablet (200 mg total) by mouth daily.    Dispense:  90 tablet    Refill:  0   methotrexate  (RHEUMATREX) 2.5 MG tablet    Sig: Take 8 tablets (20 mg total) by mouth once a week. Caution:Chemotherapy. Protect from light.    Dispense:  96 tablet    Refill:  0     Follow-Up Instructions: Return in about 3 months (around 07/05/2024) for RA on MTX/HCQ f/u 3mos.   Lonni LELON Ester, MD  Note - This record has been created using [employer].  Chart creation errors have been sought, but may not always  have been located. Such creation errors do not reflect on  the standard of medical care.

## 2024-03-23 ENCOUNTER — Encounter: Payer: Self-pay | Admitting: Radiology

## 2024-03-27 DIAGNOSIS — N181 Chronic kidney disease, stage 1: Secondary | ICD-10-CM | POA: Diagnosis not present

## 2024-03-27 DIAGNOSIS — Z6837 Body mass index (BMI) 37.0-37.9, adult: Secondary | ICD-10-CM | POA: Diagnosis not present

## 2024-03-27 DIAGNOSIS — M05632 Rheumatoid arthritis of left wrist with involvement of other organs and systems: Secondary | ICD-10-CM | POA: Diagnosis not present

## 2024-03-27 DIAGNOSIS — E1122 Type 2 diabetes mellitus with diabetic chronic kidney disease: Secondary | ICD-10-CM | POA: Diagnosis not present

## 2024-03-27 DIAGNOSIS — Z0000 Encounter for general adult medical examination without abnormal findings: Secondary | ICD-10-CM | POA: Diagnosis not present

## 2024-03-27 DIAGNOSIS — E7849 Other hyperlipidemia: Secondary | ICD-10-CM | POA: Diagnosis not present

## 2024-03-27 DIAGNOSIS — I10 Essential (primary) hypertension: Secondary | ICD-10-CM | POA: Diagnosis not present

## 2024-04-05 ENCOUNTER — Ambulatory Visit: Attending: Internal Medicine | Admitting: Internal Medicine

## 2024-04-05 ENCOUNTER — Ambulatory Visit (INDEPENDENT_AMBULATORY_CARE_PROVIDER_SITE_OTHER)

## 2024-04-05 ENCOUNTER — Encounter: Payer: Self-pay | Admitting: Internal Medicine

## 2024-04-05 ENCOUNTER — Ambulatory Visit

## 2024-04-05 VITALS — BP 119/65 | HR 69 | Resp 15 | Ht 64.0 in | Wt 223.8 lb

## 2024-04-05 DIAGNOSIS — M79642 Pain in left hand: Secondary | ICD-10-CM

## 2024-04-05 DIAGNOSIS — M059 Rheumatoid arthritis with rheumatoid factor, unspecified: Secondary | ICD-10-CM | POA: Diagnosis not present

## 2024-04-05 DIAGNOSIS — Z79899 Other long term (current) drug therapy: Secondary | ICD-10-CM | POA: Diagnosis not present

## 2024-04-05 DIAGNOSIS — M79641 Pain in right hand: Secondary | ICD-10-CM

## 2024-04-05 MED ORDER — HYDROXYCHLOROQUINE SULFATE 200 MG PO TABS: 200.0000 mg | ORAL_TABLET | Freq: Every day | ORAL | 0 refills | Status: AC

## 2024-04-05 MED ORDER — METHOTREXATE SODIUM 2.5 MG PO TABS: 20.0000 mg | ORAL_TABLET | ORAL | 0 refills | Status: DC

## 2024-04-06 ENCOUNTER — Ambulatory Visit: Payer: Self-pay | Admitting: Internal Medicine

## 2024-04-06 LAB — CBC WITH DIFFERENTIAL/PLATELET
Absolute Lymphocytes: 1350 {cells}/uL (ref 850–3900)
Absolute Monocytes: 493 {cells}/uL (ref 200–950)
Basophils Absolute: 50 {cells}/uL (ref 0–200)
Basophils Relative: 0.9 %
Eosinophils Absolute: 291 {cells}/uL (ref 15–500)
Eosinophils Relative: 5.2 %
HCT: 31.7 % — ABNORMAL LOW (ref 35.0–45.0)
Hemoglobin: 10.5 g/dL — ABNORMAL LOW (ref 11.7–15.5)
MCH: 28.8 pg (ref 27.0–33.0)
MCHC: 33.1 g/dL (ref 32.0–36.0)
MCV: 86.8 fL (ref 80.0–100.0)
MPV: 12.2 fL (ref 7.5–12.5)
Monocytes Relative: 8.8 %
Neutro Abs: 3416 {cells}/uL (ref 1500–7800)
Neutrophils Relative %: 61 %
Platelets: 192 Thousand/uL (ref 140–400)
RBC: 3.65 Million/uL — ABNORMAL LOW (ref 3.80–5.10)
RDW: 14.5 % (ref 11.0–15.0)
Total Lymphocyte: 24.1 %
WBC: 5.6 Thousand/uL (ref 3.8–10.8)

## 2024-04-06 LAB — COMPREHENSIVE METABOLIC PANEL WITH GFR
AG Ratio: 1.3 (calc) (ref 1.0–2.5)
ALT: 15 U/L (ref 6–29)
AST: 14 U/L (ref 10–35)
Albumin: 3.9 g/dL (ref 3.6–5.1)
Alkaline phosphatase (APISO): 99 U/L (ref 37–153)
BUN: 19 mg/dL (ref 7–25)
CO2: 25 mmol/L (ref 20–32)
Calcium: 8.7 mg/dL (ref 8.6–10.4)
Chloride: 105 mmol/L (ref 98–110)
Creat: 0.79 mg/dL (ref 0.50–1.05)
Globulin: 3 g/dL (ref 1.9–3.7)
Glucose, Bld: 153 mg/dL — ABNORMAL HIGH (ref 65–99)
Potassium: 4.6 mmol/L (ref 3.5–5.3)
Sodium: 139 mmol/L (ref 135–146)
Total Bilirubin: 0.3 mg/dL (ref 0.2–1.2)
Total Protein: 6.9 g/dL (ref 6.1–8.1)
eGFR: 83 mL/min/1.73m2 (ref >=60)

## 2024-04-06 LAB — SEDIMENTATION RATE: Sed Rate: 36 mm/h — ABNORMAL HIGH (ref 0–30)

## 2024-04-06 NOTE — Progress Notes (Signed)
 Blood counts and kidney and liver function test are fine no problem with the medication.  Her sedimentation rate is a little bit higher than before at 36 but this has been about her usual range for a long time.  X-rays do not show any evidence of rheumatoid arthritis damage progression.  She does have a moderate amount of wear-and-tear arthritis throughout the finger joints of both hands.

## 2024-04-28 ENCOUNTER — Ambulatory Visit: Payer: Self-pay | Admitting: Internal Medicine

## 2024-04-28 ENCOUNTER — Ambulatory Visit
Admission: EM | Admit: 2024-04-28 | Discharge: 2024-04-28 | Disposition: A | Discharge status: Home / Self Care | Attending: Internal Medicine | Admitting: Internal Medicine

## 2024-04-28 ENCOUNTER — Ambulatory Visit (INDEPENDENT_AMBULATORY_CARE_PROVIDER_SITE_OTHER)

## 2024-04-28 ENCOUNTER — Encounter: Payer: Self-pay | Admitting: Emergency Medicine

## 2024-04-28 DIAGNOSIS — M19011 Primary osteoarthritis, right shoulder: Secondary | ICD-10-CM

## 2024-04-28 DIAGNOSIS — M25511 Pain in right shoulder: Secondary | ICD-10-CM

## 2024-04-28 DIAGNOSIS — M47814 Spondylosis without myelopathy or radiculopathy, thoracic region: Secondary | ICD-10-CM | POA: Diagnosis not present

## 2024-04-28 MED ORDER — METHYLPREDNISOLONE SODIUM SUCC 125 MG IJ SOLR
80.0000 mg | Freq: Once | INTRAMUSCULAR | Status: AC
  Administered 2024-04-28: 80 mg via INTRAMUSCULAR

## 2024-04-28 MED ORDER — PREDNISONE 20 MG PO TABS: 40.0000 mg | ORAL_TABLET | Freq: Every day | ORAL | 0 refills | Status: AC

## 2024-04-28 MED ORDER — BACLOFEN 10 MG PO TABS: 10.0000 mg | ORAL_TABLET | Freq: Three times a day (TID) | ORAL | 0 refills | Status: AC

## 2024-04-28 MED ORDER — KETOROLAC TROMETHAMINE 30 MG/ML IJ SOLN
30.0000 mg | Freq: Once | INTRAMUSCULAR | Status: AC
  Administered 2024-04-28: 30 mg via INTRAMUSCULAR

## 2024-04-28 NOTE — Discharge Instructions (Addendum)
 We gave you a steroid injection and an anti-inflammatory injection today for your shoulder pain.  Start taking steroid pills tomorrow by taking 2 pills of prednisone each day in the morning with breakfast.  You may take Tylenol  1000 mg every 6 hours as needed for further pain to the right shoulder.  You may also take baclofen muscle relaxer at bedtime as needed for muscle spasm/shoulder pain.  I have given you a shoulder sling today.  You may use the sling for up to 2 days (48 hours). The sling is to help support your right arm while your pain is improving with the use of the steroid.  Do not use a sling for more than 48 hours as this can cause muscle weakness of your right arm.  Please schedule a follow-up appointment with Ortho care in [place] at the phone number listed above.  If your symptoms do not improve in the next 2 to 3 days with use of interventions we have prescribed here, please go to the ER for further workup and evaluation.  Of note, your blood sugars may temporarily increase for the next 4 to 5 days while you are taking steroid.  Your blood sugars will go back down to normal once you stop the steroid medication.

## 2024-04-28 NOTE — ED Triage Notes (Signed)
 Right shoulder pain x 3 days.  States can hardly move arm.  States has a 20% torn rotator cuff.

## 2024-04-28 NOTE — ED Provider Notes (Signed)
 [facility]    CSN: [id number] Arrival date & time: 04/28/24  9160      History   Chief Complaint No chief complaint on file.   HPI Brooke Sawyer is a 63 y.o. female.   Brooke Sawyer is a 63 y.o. female presenting for chief complaint of acute on chronic right shoulder pain.  Right shoulder pain started gradually 3 days ago and intensified significantly over the last 24 to 48 hours without known injury/trauma recently to the right arm/shoulder.  Pain is most intense around the right shoulder joint itself and does not radiate to the arm, back, or chest.  Denies paresthesias to the extremities, neck pain, and recent falls.  Pain is currently a 10 on a scale 0-10 and sharp.  Pain is worsened with movement of the right arm at the shoulder joint.  States she is having a very difficult time driving, putting on a bra, and performing other activities of daily living.  History of rotator cuff tear 10 years ago for which she was receiving steroid injections and physical therapy with orthopedic provider.  She has not been seen by orthopedic provider in the last 8 to 10 years.  She is using a heating pad to the right shoulder which helps very temporarily after approximately 30 minutes to 1 hour of use. Denies recent steroid use.  History of rheumatoid arthritis, takes methotrexate .  She is a diabetic, sugars have been well-controlled with use of her antidiabetic medications.     Past Medical History:  Diagnosis Date   Diabetes mellitus without complication (HCC)    Rheumatoid arthritis (HCC)    Rotator cuff tear     Patient Active Problem List   Diagnosis Date Noted   History of colonic polyps 12/01/2022   High risk medication use 06/03/2021   Proliferative diabetic retinopathy of left eye with macular edema associated with type 2 diabetes mellitus (HCC) 05/27/2021   Type 2 diabetes mellitus with diabetic retinopathy (HCC) 05/25/2021   Seropositive rheumatoid arthritis (HCC)  04/14/2018   Atypical squamous cell changes of undetermined significance (ASCUS) on vaginal cytology 08/23/2013   Partial nontraumatic tear of rotator cuff 10/18/2012   Rotator cuff syndrome of right shoulder 10/18/2012    Past Surgical History:  Procedure Laterality Date   COLONOSCOPY N/A 09/27/2012   Procedure: COLONOSCOPY;  Surgeon: Claudis RAYMOND Rivet, MD;  [facility]: [facility];  Service: Endoscopy;  Laterality: N/A;  1030   COLONOSCOPY WITH PROPOFOL  N/A 12/01/2022   Procedure: COLONOSCOPY WITH PROPOFOL ;  Surgeon: Eartha Angelia Sieving, MD;  Location: [facility];  Service: Gastroenterology;  Laterality: N/A;  11:45AM;ASA 1-2   Dilation and Currettage     POLYPECTOMY  12/01/2022   Procedure: POLYPECTOMY;  Surgeon: Eartha Angelia Sieving, MD;  Location: [facility];  Service: Gastroenterology;;   TUBAL LIGATION      OB History     Gravida  2   Para  2   Term      Preterm      AB      Living  2      SAB      IAB      Ectopic      Multiple      Live Births               Home Medications    Prior to Admission medications   Medication Sig Start Date End Date Taking? Authorizing Provider  baclofen (LIORESAL) 10 MG tablet  Take 1 tablet (10 mg total) by mouth 3 (three) times daily. 04/28/24  Yes Enedelia Dorna HERO, FNP  predniSONE (DELTASONE) 20 MG tablet Take 2 tablets (40 mg total) by mouth daily with breakfast for 5 days. 04/28/24 05/03/24 Yes Enedelia Dorna HERO, FNP  atorvastatin  (LIPITOR) 20 MG tablet TAKE 1 TABLET BY MOUTH EVERYDAY AT BEDTIME 09/07/21   Chandra Harlene LABOR, NP  Cholecalciferol (VITAMIN D) 50 MCG (2000 UT) tablet Take 2,000 Units by mouth daily.    [provider]  Cyanocobalamin  (B-12 PO) Take 1,000 mcg by mouth daily.    [provider]  folic acid  (FOLVITE ) 1 MG tablet Take 1 tablet (1 mg total) by mouth daily. 09/12/23   Jeannetta Lonni ORN, MD  glipiZIDE  (GLUCOTROL ) 10 MG tablet Take 10 mg by mouth 2 (two) times  daily. 06/12/22   [provider]  hydroxychloroquine  (PLAQUENIL ) 200 MG tablet Take 1 tablet (200 mg total) by mouth daily. 04/05/24   Rice, Lonni ORN, MD  JARDIANCE 25 MG TABS tablet Take 25 mg by mouth every morning. Patient not taking: Reported on 04/05/2024 11/05/20   [provider]  levocetirizine (XYZAL) 5 MG tablet Take 5 mg by mouth daily as needed for allergies. 11/02/22   [provider]  losartan (COZAAR) 25 MG tablet Take 25 mg by mouth daily. 02/04/22   [provider]  metFORMIN (GLUCOPHAGE) 500 MG tablet TAKE 2 TABLETS BY MOUTH TWICE A DAY WITH MORNING AND EVENING MEALS 09/11/21   Duanne Butler DASEN, MD  methotrexate  (RHEUMATREX) 2.5 MG tablet Take 8 tablets (20 mg total) by mouth once a week. Caution:Chemotherapy. Protect from light. 04/05/24   Rice, Lonni ORN, MD  Omega-3 Fatty Acids (FISH OIL OMEGA-3) 1000 MG CAPS Take 1,000 mg by mouth daily.    [provider]  TRULICITY 0.75 MG/0.5ML SOAJ Inject into the skin. Patient taking differently: Inject into the skin once a week. 04/04/24   [provider]    Family History Family History  Problem Relation Age of Onset   Diabetes Mother    Heart disease Mother    Diabetes Father    COPD Sister    Hypertension Brother    Graves' disease Daughter    Colon cancer Neg Hx     Social History Social History   Tobacco Use   Smoking status: Former    Current packs/day: 0.00    Average packs/day: 0.5 packs/day for 6.0 years (3.0 ttl pk-yrs)    Types: Cigarettes    Start date: 41    Quit date: 1992    Years since quitting: 33.7    Passive exposure: Never   Smokeless tobacco: Never  Vaping Use   Vaping status: Never Used  Substance Use Topics   Alcohol use: No   Drug use: No     Allergies   Patient has no known allergies.   Review of Systems Review of Systems Per HPI  Physical Exam Triage Vital Signs ED Triage Vitals  Encounter Vitals Group     BP 04/28/24  0853 136/70     Girls Systolic BP Percentile --      Girls Diastolic BP Percentile --      Boys Systolic BP Percentile --      Boys Diastolic BP Percentile --      Pulse Rate 04/28/24 0853 90     Resp 04/28/24 0853 18     Temp 04/28/24 0853 99 F (37.2 C)     Temp Source  04/28/24 0853 Oral     SpO2 04/28/24 0853 93 %     Weight --      Height --      Head Circumference --      Peak Flow --      Pain Score 04/28/24 0854 10     Pain Loc --      Pain Education --      Exclude from Growth Chart --    No data found.  Updated Vital Signs BP 136/70 (BP Location: Left Arm)   Pulse 90   Temp 99 F (37.2 C) (Oral)   Resp 18   LMP 05/25/2016   SpO2 93%   Visual Acuity Right Eye Distance:   Left Eye Distance:   Bilateral Distance:    Right Eye Near:   Left Eye Near:    Bilateral Near:     Physical Exam Vitals and nursing note reviewed.  Constitutional:      Appearance: She is not ill-appearing or toxic-appearing.  HENT:     Head: Normocephalic and atraumatic.     Right Ear: Hearing and external ear normal.     Left Ear: Hearing and external ear normal.     Nose: Nose normal.     Mouth/Throat:     Lips: Pink.  Eyes:     General: Lids are normal. Vision grossly intact. Gaze aligned appropriately.     Extraocular Movements: Extraocular movements intact.     Conjunctiva/sclera: Conjunctivae normal.  Pulmonary:     Effort: Pulmonary effort is normal.  Musculoskeletal:     Right shoulder: Tenderness and bony tenderness present. No swelling, deformity, effusion or crepitus. Decreased range of motion (Minimal ability to abduct right upper extremity at the right shoulder joint with active ROM.  She is unable to tolerate much abduction of the RUE at the shoulder joint with passive ROM on exam.). Normal strength (5/5 strength against resistance with abduction and adduction though minimal ROM of the right shoulder joint.). Normal pulse (+2 right brachial and right radial pulses.   5/5 right hand grip strength.  Sensation intact to distal right upper extremity.  Less than 2 cap refill.).     Right upper arm: Normal.     Right elbow: Normal.     Cervical back: Neck supple.     Comments: Unable to perform drop can test due to severity of patient's pain.  Skin:    General: Skin is warm and dry.     Capillary Refill: Capillary refill takes less than 2 seconds.     Findings: No rash.  Neurological:     General: No focal deficit present.     Mental Status: She is alert and oriented to person, place, and time. Mental status is at baseline.     Cranial Nerves: No dysarthria or facial asymmetry.  Psychiatric:        Mood and Affect: Mood normal.        Speech: Speech normal.        Behavior: Behavior normal.        Thought Content: Thought content normal.        Judgment: Judgment normal.      UC Treatments / Results  Labs (all labs ordered are listed, but only abnormal results are displayed) Labs Reviewed - No data to display  EKG   Radiology No results found.  Procedures Procedures (including critical care time)  Medications Ordered in UC Medications  ketorolac (TORADOL) 30 MG/ML injection 30 mg (  30 mg Intramuscular Given 04/28/24 0948)  methylPREDNISolone  sodium succinate (SOLU-MEDROL ) 125 mg/2 mL injection 80 mg (80 mg Intramuscular Given 04/28/24 0946)    Initial Impression / Assessment and Plan / UC Course  I have reviewed the triage vital signs and the nursing notes.  Pertinent labs & imaging results that were available during my care of the patient were reviewed by me and considered in my medical decision making (see chart for details).   1.  Acute pain of right shoulder, arthritis of right shoulder Right shoulder x-ray performed in clinic shows significant arthritic/degenerative changes to the right shoulder joint on wet read without acute bony abnormality.  Will call if radiology reread shows abnormality requiring further change in treatment  plan.  Renal function intact per recent CMP from September 2025. Ketorolac 30 mg IM and methylprednisolone  80 mg IM given in clinic for acute pain. Advised to start prednisone 40 mg once daily for 5 days tomorrow. Baclofen muscle relaxer at bedtime as needed for muscle spasm component of pain.  Patient will need to be reevaluated by Ortho and would benefit greatly from intra-articular injection/physical therapy for further relief of shoulder pain.  I have given her a sling in the clinic today that she may wear for up to 48 hours while steroids are kicking in. I have advised her to avoid wearing sling after 48 hours as I would like for her to complete gentle range of motion activities and start to incorporate more movement of the shoulder to avoid shoulder stiffness.  Walking referral to [facility] care given.  Counseled patient on potential for adverse effects with medications prescribed/recommended today, strict ER and return-to-clinic precautions discussed, patient verbalized understanding.    Final Clinical Impressions(s) / UC Diagnoses   Final diagnoses:  Acute pain of right shoulder  Arthritis of right shoulder     Discharge Instructions      We gave you a steroid injection and an anti-inflammatory injection today for your shoulder pain.  Start taking steroid pills tomorrow by taking 2 pills of prednisone each day in the morning with breakfast.  You may take Tylenol  1000 mg every 6 hours as needed for further pain to the right shoulder.  You may also take baclofen muscle relaxer at bedtime as needed for muscle spasm/shoulder pain.  I have given you a shoulder sling today.  You may use the sling for up to 2 days (48 hours). The sling is to help support your right arm while your pain is improving with the use of the steroid.  Do not use a sling for more than 48 hours as this can cause muscle weakness of your right arm.  Please schedule a follow-up appointment with  Ortho care in [place] at the phone number listed above.  If your symptoms do not improve in the next 2 to 3 days with use of interventions we have prescribed here, please go to the ER for further workup and evaluation.  Of note, your blood sugars may temporarily increase for the next 4 to 5 days while you are taking steroid.  Your blood sugars will go back down to normal once you stop the steroid medication.    ED Prescriptions     Medication Sig Dispense Auth. Provider   predniSONE (DELTASONE) 20 MG tablet Take 2 tablets (40 mg total) by mouth daily with breakfast for 5 days. 10 tablet Jamee Pacholski M, FNP   baclofen (LIORESAL) 10 MG tablet Take 1 tablet (10 mg total) by  mouth 3 (three) times daily. 30 each Enedelia Dorna HERO, FNP      PDMP not reviewed this encounter.   Enedelia Dorna [place], [place] 04/28/24 [phone]

## 2024-05-16 ENCOUNTER — Encounter (INDEPENDENT_AMBULATORY_CARE_PROVIDER_SITE_OTHER): Payer: Self-pay | Admitting: Gastroenterology

## 2024-06-01 NOTE — Progress Notes (Signed)
 Brooke Sawyer                                          MRN: [id number]   06/01/2024   The [facility] Quality Team Specialist reviewed this patient medical record for the purposes of chart review for care gap closure. The following were reviewed: chart review for care gap closure-kidney health evaluation for diabetes:uACR.    [facility] Quality Team

## 2024-06-04 ENCOUNTER — Encounter: Payer: Self-pay | Admitting: Radiology

## 2024-06-12 ENCOUNTER — Other Ambulatory Visit (HOSPITAL_COMMUNITY): Payer: Self-pay | Admitting: Internal Medicine

## 2024-06-12 DIAGNOSIS — Z1231 Encounter for screening mammogram for malignant neoplasm of breast: Secondary | ICD-10-CM

## 2024-06-18 NOTE — Progress Notes (Shared)
 [facility] - Clinic Note  06/22/2024     CHIEF COMPLAINT Patient presents for No chief complaint on file.   HISTORY OF PRESENT ILLNESS: Brooke Sawyer is a 63 y.o. female who presents to the clinic today for:     Referring physician: Darroll Anes, DO [address] [place],  [place] [id number]  HISTORICAL INFORMATION:   Selected notes from the MEDICAL RECORD NUMBER    CURRENT MEDICATIONS: No current outpatient medications on file. (Ophthalmic Drugs)   No current facility-administered medications for this visit. (Ophthalmic Drugs)   Current Outpatient Medications (Other)  Medication Sig   atorvastatin  (LIPITOR) 20 MG tablet TAKE 1 TABLET BY MOUTH EVERYDAY AT BEDTIME   baclofen (LIORESAL) 10 MG tablet Take 1 tablet (10 mg total) by mouth 3 (three) times daily.   Cholecalciferol (VITAMIN D) 50 MCG (2000 UT) tablet Take 2,000 Units by mouth daily.   Cyanocobalamin  (B-12 PO) Take 1,000 mcg by mouth daily.   folic acid  (FOLVITE ) 1 MG tablet Take 1 tablet (1 mg total) by mouth daily.   glipiZIDE  (GLUCOTROL ) 10 MG tablet Take 10 mg by mouth 2 (two) times daily.   hydroxychloroquine  (PLAQUENIL ) 200 MG tablet Take 1 tablet (200 mg total) by mouth daily.   JARDIANCE 25 MG TABS tablet Take 25 mg by mouth every morning. (Patient not taking: Reported on 04/05/2024)   levocetirizine (XYZAL) 5 MG tablet Take 5 mg by mouth daily as needed for allergies.   losartan (COZAAR) 25 MG tablet Take 25 mg by mouth daily.   metFORMIN (GLUCOPHAGE) 500 MG tablet TAKE 2 TABLETS BY MOUTH TWICE A DAY WITH MORNING AND EVENING MEALS   methotrexate  (RHEUMATREX) 2.5 MG tablet Take 8 tablets (20 mg total) by mouth once a week. Caution:Chemotherapy. Protect from light.   Omega-3 Fatty Acids (FISH OIL OMEGA-3) 1000 MG CAPS Take 1,000 mg by mouth daily.   TRULICITY 0.75 MG/0.5ML SOAJ Inject into the skin. (Patient taking differently: Inject into the skin once a week.)   No current  facility-administered medications for this visit. (Other)   REVIEW OF SYSTEMS:    ALLERGIES No Known Allergies  PAST MEDICAL HISTORY Past Medical History:  Diagnosis Date   Diabetes mellitus without complication (HCC)    Rheumatoid arthritis (HCC)    Rotator cuff tear    Past Surgical History:  Procedure Laterality Date   COLONOSCOPY N/A 09/27/2012   Procedure: COLONOSCOPY;  Surgeon: Claudis RAYMOND Rivet, MD;  [facility];  Service: Endoscopy;  Laterality: N/A;  1030   COLONOSCOPY WITH PROPOFOL  N/A 12/01/2022   Procedure: COLONOSCOPY WITH PROPOFOL ;  Surgeon: Eartha Angelia Sieving, MD;  [facility];  Service: Gastroenterology;  Laterality: N/A;  11:45AM;ASA 1-2   Dilation and Currettage     POLYPECTOMY  12/01/2022   Procedure: POLYPECTOMY;  Surgeon: Eartha Angelia Sieving, MD;  [facility];  Service: Gastroenterology;;   TUBAL LIGATION     FAMILY HISTORY Family History  Problem Relation Age of Onset   Diabetes Mother    Heart disease Mother    Diabetes Father    COPD Sister    Hypertension Brother    Graves' disease Daughter    Colon cancer Neg Hx    SOCIAL HISTORY Social History   Tobacco Use   Smoking status: Former    Current packs/day: 0.00    Average packs/day: 0.5 packs/day for 6.0 years (3.0 ttl pk-yrs)    Types: Cigarettes    Start date:  24    Quit date: 35    Years since quitting: 33.9    Passive exposure: Never   Smokeless tobacco: Never  Vaping Use   Vaping status: Never Used  Substance Use Topics   Alcohol use: No   Drug use: No       OPHTHALMIC EXAM:   Not recorded     IMAGING AND PROCEDURES  Imaging and Procedures for @TODAY @           ASSESSMENT/PLAN:  No diagnosis found.  1,2. Proliferative diabetic retinopathy w/ DME OU  - Pt lost to f/u after initial visit in 2020  - last A1c 7.9 (01.25.23) - FA in 2020 showed NVE OS -- pt was scheduled for PRP OS but never made it back - repeat FA  (08.07.24) shows OD: Mild NV greatest IT macula and inferior midzone; OS: Scattered NV 360 midzone, new leakage from disc (mild NVD) -- pt would benefit from PRP OU - repeat FA (12.17.24) shows OD: Mild NV greatest IT macula and inferior mid zone-- interval improvement in leakage, OS: Scattered NV with leakage 360 mid zone slightly improved from prior, new leakage from disc (mild NVD) - improved -- discussed benefit of PRP fill in OU - s/p PRP OS (08.13.24), (02.11.25) - s/p PRP OD (09.13.24), (01.22.25) - f/u 4-6 months, DFE, OCT, FA (transit OS)  3,4. Hypertensive retinopathy OU - discussed importance of tight BP control - monitor  5. Mixed form age related cataracts OU  - The symptoms of cataract, surgical options, and treatments and risks were discussed with patient. - discussed diagnosis and progression - monitor  Ophthalmic Meds Ordered this visit:  No orders of the defined types were placed in this encounter.    No follow-ups on file.  There are no Patient Instructions on file for this visit.  This document serves as a record of services personally performed by Redell JUDITHANN Hans, MD, PhD. It was created on their behalf by Delon Newness COT, an ophthalmic technician. The creation of this record is the provider's dictation and/or activities during the visit.    Electronically signed by: Delon Newness COT 11.17.25  9:22 AM   Abbreviations: M myopia (nearsighted); A astigmatism; H hyperopia (farsighted); P presbyopia; Mrx spectacle prescription;  CTL contact lenses; OD right eye; OS left eye; OU both eyes  XT exotropia; ET esotropia; PEK punctate epithelial keratitis; PEE punctate epithelial erosions; DES dry eye syndrome; MGD meibomian gland dysfunction; ATs artificial tears; PFAT's preservative free artificial tears; NSC nuclear sclerotic cataract; PSC posterior subcapsular cataract; ERM epi-retinal membrane; PVD posterior vitreous detachment; RD retinal detachment; DM  diabetes mellitus; DR diabetic retinopathy; NPDR non-proliferative diabetic retinopathy; PDR proliferative diabetic retinopathy; CSME clinically significant macular edema; DME diabetic macular edema; dbh dot blot hemorrhages; CWS cotton wool spot; POAG primary open angle glaucoma; C/D cup-to-disc ratio; HVF humphrey visual field; GVF goldmann visual field; OCT optical coherence tomography; IOP intraocular pressure; BRVO Branch retinal vein occlusion; CRVO central retinal vein occlusion; CRAO central retinal artery occlusion; BRAO branch retinal artery occlusion; RT retinal tear; SB scleral buckle; PPV pars plana vitrectomy; VH Vitreous hemorrhage; PRP panretinal laser photocoagulation; IVK intravitreal kenalog; VMT vitreomacular traction; MH Macular hole;  NVD neovascularization of the disc; NVE neovascularization elsewhere; AREDS age related eye disease study; ARMD age related macular degeneration; POAG primary open angle glaucoma; EBMD epithelial/anterior basement membrane dystrophy; ACIOL anterior chamber intraocular lens; IOL intraocular lens; PCIOL posterior chamber intraocular lens; Phaco/IOL phacoemulsification with intraocular lens placement; PRK photorefractive keratectomy;  LASIK laser assisted in situ keratomileusis; HTN hypertension; DM diabetes mellitus; COPD chronic obstructive pulmonary disease

## 2024-06-20 NOTE — Progress Notes (Signed)
 [facility] - Clinic Note  07/02/2024     CHIEF COMPLAINT Patient presents for Retina Follow Up   HISTORY OF PRESENT ILLNESS: Brooke Sawyer is a 63 y.o. female who presents to the clinic today for:   HPI     Retina Follow Up   Patient presents with  Diabetic Retinopathy.  In both eyes.  Severity is moderate.  Duration of 6 months.  Since onset it is stable.  I, the attending physician,  performed the HPI with the patient and updated documentation appropriately.        Comments   6 month Retina eval. Patient states vision seems the same. Blood sugar 110      Last edited by Valdemar Rogue, MD on 07/02/2024  6:18 PM.     Pt states VA seems stable  Referring physician: Darroll Anes, DO [address] [place],  [place] [address]  HISTORICAL INFORMATION:   Selected notes from the MEDICAL RECORD NUMBER    CURRENT MEDICATIONS: No current outpatient medications on file. (Ophthalmic Drugs)   No current facility-administered medications for this visit. (Ophthalmic Drugs)   Current Outpatient Medications (Other)  Medication Sig   atorvastatin  (LIPITOR) 20 MG tablet TAKE 1 TABLET BY MOUTH EVERYDAY AT BEDTIME   baclofen  (LIORESAL ) 10 MG tablet Take 1 tablet (10 mg total) by mouth 3 (three) times daily.   Cholecalciferol (VITAMIN D) 50 MCG (2000 UT) tablet Take 2,000 Units by mouth daily.   Cyanocobalamin  (B-12 PO) Take 1,000 mcg by mouth daily.   folic acid  (FOLVITE ) 1 MG tablet Take 1 tablet (1 mg total) by mouth daily.   glipiZIDE  (GLUCOTROL ) 10 MG tablet Take 10 mg by mouth 2 (two) times daily.   hydroxychloroquine  (PLAQUENIL ) 200 MG tablet Take 1 tablet (200 mg total) by mouth daily.   levocetirizine (XYZAL) 5 MG tablet Take 5 mg by mouth daily as needed for allergies.   losartan (COZAAR) 25 MG tablet Take 25 mg by mouth daily.   metFORMIN (GLUCOPHAGE) 500 MG tablet TAKE 2 TABLETS BY MOUTH TWICE A DAY WITH MORNING AND EVENING MEALS   methotrexate   (RHEUMATREX) 2.5 MG tablet Take 8 tablets (20 mg total) by mouth once a week. Caution:Chemotherapy. Protect from light.   Omega-3 Fatty Acids (FISH OIL OMEGA-3) 1000 MG CAPS Take 1,000 mg by mouth daily.   TRULICITY 0.75 MG/0.5ML SOAJ Inject into the skin.   JARDIANCE 25 MG TABS tablet Take 25 mg by mouth every morning. (Patient not taking: Reported on 04/05/2024)   No current facility-administered medications for this visit. (Other)   REVIEW OF SYSTEMS: ROS   Positive for: Endocrine, Eyes Negative for: Constitutional, Gastrointestinal, Neurological, Skin, Genitourinary, Musculoskeletal, HENT, Cardiovascular, Respiratory, Psychiatric, Allergic/Imm, Heme/Lymph Last edited by German Olam BRAVO, COT on 07/02/2024  8:19 AM.     ALLERGIES No Known Allergies  PAST MEDICAL HISTORY Past Medical History:  Diagnosis Date   Diabetes mellitus without complication (HCC)    Rheumatoid arthritis (HCC)    Rotator cuff tear    Past Surgical History:  Procedure Laterality Date   COLONOSCOPY N/A 09/27/2012   Procedure: COLONOSCOPY;  Surgeon: Claudis RAYMOND Rivet, MD;  [facility]: [facility];  Service: Endoscopy;  Laterality: N/A;  1030   COLONOSCOPY WITH PROPOFOL  N/A 12/01/2022   Procedure: COLONOSCOPY WITH PROPOFOL ;  Surgeon: Eartha Angelia Sieving, MD;  Location: [facility];  Service: Gastroenterology;  Laterality: N/A;  11:45AM;ASA 1-2   Dilation and Currettage     POLYPECTOMY  12/01/2022   Procedure: POLYPECTOMY;  Surgeon: Eartha Flavors, Toribio, MD;  Location: [facility];  Service: Gastroenterology;;   TUBAL LIGATION     FAMILY HISTORY Family History  Problem Relation Age of Onset   Diabetes Mother    Heart disease Mother    Diabetes Father    COPD Sister    Hypertension Brother    Graves' disease Daughter    Colon cancer Neg Hx    SOCIAL HISTORY Social History   Tobacco Use   Smoking status: Former    Current packs/day: 0.00    Average packs/day: 0.5 packs/day for 6.0 years (3.0  ttl pk-yrs)    Types: Cigarettes    Start date: 76    Quit date: 56    Years since quitting: 33.9    Passive exposure: Never   Smokeless tobacco: Never  Vaping Use   Vaping status: Never Used  Substance Use Topics   Alcohol use: No   Drug use: No       OPHTHALMIC EXAM:   Base Eye Exam     Visual Acuity (Snellen - Linear)       Right Left   Dist [place] 20/25 -2 20/30 -3   Dist ph [place] 20/NI 20/25 -1         Tonometry (Tonopen, 8:21 AM)       Right Left   Pressure 17 17         Pupils       Dark Light Shape React APD   Right 3 2 Round Brisk None   Left 3 2 Round Brisk None         Visual Fields (Counting fingers)       Left Right    Full Full         Extraocular Movement       Right Left    Full, Ortho Full, Ortho         Neuro/Psych     Oriented x3: Yes   Mood/Affect: Normal         Dilation     Both eyes: 1.0% Mydriacyl, 2.5% Phenylephrine @ 8:21 AM           Slit Lamp and Fundus Exam     Slit Lamp Exam       Right Left   Lids/Lashes Dermatochalasis Normal   Conjunctiva/Sclera mild melanosis Trace Melanosis   Cornea trace Arcus, trace Punctate epithelial erosions trace Arcus, trace Punctate epithelial erosions   Anterior Chamber Deep and quiet Deep and quiet   Iris Round and dilated, No NVI Round and dilated, No NVI   Lens 2+ Nuclear sclerosis with early brunescense, 2+ Cortical cataract 2+ Nuclear sclerosis with mild brunescense, 2+ Cortical cataract   Anterior Vitreous trace Vitreous syneresis trace Vitreous syneresis         Fundus Exam       Right Left   Disc Pink and Sharp, no NVD Pink and Sharp, +fine NVD and early fibrosis nasal disc -- regressed   C/D Ratio 0.3 0.4   Macula Flat, Good foveal reflex, rare MA, no edema Flat, Good foveal reflex, scattered Microaneurysms   Vessels attenuated, Tortuous, early NV -- regressing, mild copper wiring attenuated, Tortuous, +NV -- regressing   Periphery Attached, rare MA,  early focal NV -- scattered and regressing; good 360 PRP with good fill in changes Attached, scattered MA/DBH, focal patches of fibrosis and NV-improving, good 360 PRP with good fill in changes  IMAGING AND PROCEDURES  Imaging and Procedures for @TODAY @  OCT, Retina - OU - Both Eyes       Right Eye Quality was good. Central Foveal Thickness: 298. Progression has improved. Findings include normal foveal contour, no IRF, no SRF, vitreomacular adhesion (Mild focal IRF/cystic changes temporal macula -- resolved; interval release of central VMA to partial PVD).   Left Eye Quality was good. Central Foveal Thickness: 317. Progression has improved. Findings include normal foveal contour, no IRF, no SRF, intraretinal hyper-reflective material, vitreomacular adhesion (Stable improvement in trace cystic changes superior to fovea and temporal macula; partial PVD and fibrosis over disc).   Notes *Images captured and stored on drive  Diagnosis / Impression:  NFP, no SRF, +IRF OU OD: Mild focal IRF/cystic changes temporal macula -- resolved; interval release of central VMA to partial PVD OS: Trace cystic changes superior to fovea and temporal macula -- improved; partial PVD and fibrosis over disc  Clinical management:  See below  Abbreviations: NFP - Normal foveal profile. CME - cystoid macular edema. PED - pigment epithelial detachment. IRF - intraretinal fluid. SRF - subretinal fluid. EZ - ellipsoid zone. ERM - epiretinal membrane. ORA - outer retinal atrophy. ORT - outer retinal tubulation. SRHM - subretinal hyper-reflective material      Fluorescein  Angiography Optos (Transit OS)       Right Eye Progression has improved. Early phase findings include staining, microaneurysm, retinal neovascularization, vascular perfusion defect. Mid/Late phase findings include leakage, microaneurysm, retinal neovascularization, vascular perfusion defect (Interval improvement in mild NV greatest  IT macula and inferior mid zone-- decreased leakage compared to prior studies).   Left Eye Progression has improved. Early phase findings include delayed filling, staining, microaneurysm, vascular perfusion defect. Mid/Late phase findings include leakage, microaneurysm, retinal neovascularization, vascular perfusion defect (Scattered NVE with leakage 360 mid zone improved from prior, no NVD).   Notes **Images stored on drive**  Impression: PDR OU OD: Interval improvement in mild NV greatest IT macula and inferior mid zone-- decreased leakage compared to prior studies OS: Scattered NVE with leakage 360 mid zone improved from prior, no NVD               ASSESSMENT/PLAN:    ICD-10-CM   1. Proliferative diabetic retinopathy of both eyes with macular edema associated with type 2 diabetes mellitus (HCC)  E11.3513 OCT, Retina - OU - Both Eyes    Fluorescein  Angiography Optos (Transit OS)    2. Long term (current) use of oral hypoglycemic drugs  Z79.84     3. Diabetes mellitus treated with injections of non-insulin medication (HCC)  E11.9    Z79.85     4. Essential hypertension  I10     5. Hypertensive retinopathy of both eyes  H35.033 Fluorescein  Angiography Optos (Transit OS)    6. Combined forms of age-related cataract of both eyes  H25.813       1-3. Proliferative diabetic retinopathy w/ DME OU  - Pt lost to f/u after initial visit in 2020  - last A1c 7.9 (01.25.23); taking glipizide , metformin and Trulicity - FA in 2020 showed NVE OS -- pt was scheduled for PRP OS but never made it back - repeat FA (08.07.24) shows OD: Mild NV greatest IT macula and inferior midzone; OS: Scattered NV 360 midzone, new leakage from disc (mild NVD) -- pt would benefit from PRP OU - repeat FA (12.17.24) shows OD: Mild NV greatest IT macula and inferior mid zone-- interval improvement in leakage, OS: Scattered NV with  leakage 360 mid zone slightly improved from prior, new leakage from disc (mild  NVD) - improved -- discussed benefit of PRP fill in OU - s/p PRP OS (08.13.24), (02.11.25) - s/p PRP OD (09.13.24), (01.22.25) - repeat FA (12.1.25) shows OD: Interval improvement in mild NV greatest IT macula and inferior mid zone-- decreased leakage compared to prior studies; OS: Scattered NVE with leakage 360 mid zone improved from prior, no NVD  - no retinal or ophthalmic interventions indicated or recommended - f/u 6-9 months, DFE, OCT  4,5. Hypertensive retinopathy OU - discussed importance of tight BP control - monitor  6. Mixed form age related cataracts OU  - The symptoms of cataract, surgical options, and treatments and risks were discussed with patient. - discussed diagnosis and progression - monitor  Ophthalmic Meds Ordered this visit:  No orders of the defined types were placed in this encounter.    Return for 6-9 months PDR OU, DFE, OCT.  There are no Patient Instructions on file for this visit.  Explained the diagnoses, plan, and follow up with the patient and they expressed understanding.  Patient expressed understanding of the importance of proper follow up care.   This document serves as a record of services personally performed by Redell JUDITHANN Hans, MD, PhD. It was created on their behalf by Avelina Pereyra, COA an ophthalmic technician. The creation of this record is the provider's dictation and/or activities during the visit.   Electronically signed by: Avelina GORMAN Pereyra, COT  07/02/24  6:19 PM   This document serves as a record of services personally performed by Redell JUDITHANN Hans, MD, PhD. It was created on their behalf by Almetta Pesa, an ophthalmic technician. The creation of this record is the provider's dictation and/or activities during the visit.    Electronically signed by: Almetta Pesa, OA, 07/02/24  6:19 PM  Redell JUDITHANN Hans, M.D., Ph.D. Diseases & Surgery of the Retina and Vitreous [facility] 07/02/2024  I have reviewed the  above documentation for accuracy and completeness, and I agree with the above. Redell JUDITHANN Hans, M.D., Ph.D. 07/02/24 6:20 PM   Abbreviations: M myopia (nearsighted); A astigmatism; H hyperopia (farsighted); P presbyopia; Mrx spectacle prescription;  CTL contact lenses; OD right eye; OS left eye; OU both eyes  XT exotropia; ET esotropia; PEK punctate epithelial keratitis; PEE punctate epithelial erosions; DES dry eye syndrome; MGD meibomian gland dysfunction; ATs artificial tears; PFAT's preservative free artificial tears; NSC nuclear sclerotic cataract; PSC posterior subcapsular cataract; ERM epi-retinal membrane; PVD posterior vitreous detachment; RD retinal detachment; DM diabetes mellitus; DR diabetic retinopathy; NPDR non-proliferative diabetic retinopathy; PDR proliferative diabetic retinopathy; CSME clinically significant macular edema; DME diabetic macular edema; dbh dot blot hemorrhages; CWS cotton wool spot; POAG primary open angle glaucoma; C/D cup-to-disc ratio; HVF humphrey visual field; GVF goldmann visual field; OCT optical coherence tomography; IOP intraocular pressure; BRVO Branch retinal vein occlusion; CRVO central retinal vein occlusion; CRAO central retinal artery occlusion; BRAO branch retinal artery occlusion; RT retinal tear; SB scleral buckle; PPV pars plana vitrectomy; VH Vitreous hemorrhage; PRP panretinal laser photocoagulation; IVK intravitreal kenalog; VMT vitreomacular traction; MH Macular hole;  NVD neovascularization of the disc; NVE neovascularization elsewhere; AREDS age related eye disease study; ARMD age related macular degeneration; POAG primary open angle glaucoma; EBMD epithelial/anterior basement membrane dystrophy; ACIOL anterior chamber intraocular lens; IOL intraocular lens; PCIOL posterior chamber intraocular lens; Phaco/IOL phacoemulsification with intraocular lens placement; PRK photorefractive keratectomy; LASIK laser assisted in situ keratomileusis; HTN  hypertension; DM diabetes mellitus; COPD chronic obstructive pulmonary disease

## 2024-06-22 ENCOUNTER — Encounter (INDEPENDENT_AMBULATORY_CARE_PROVIDER_SITE_OTHER): Admitting: Ophthalmology

## 2024-06-22 DIAGNOSIS — E113513 Type 2 diabetes mellitus with proliferative diabetic retinopathy with macular edema, bilateral: Secondary | ICD-10-CM

## 2024-06-22 DIAGNOSIS — H25813 Combined forms of age-related cataract, bilateral: Secondary | ICD-10-CM

## 2024-06-22 DIAGNOSIS — H35033 Hypertensive retinopathy, bilateral: Secondary | ICD-10-CM

## 2024-06-22 DIAGNOSIS — Z7984 Long term (current) use of oral hypoglycemic drugs: Secondary | ICD-10-CM

## 2024-06-22 DIAGNOSIS — I10 Essential (primary) hypertension: Secondary | ICD-10-CM

## 2024-06-25 NOTE — Progress Notes (Deleted)
 Office Visit Note  Patient: Brooke Sawyer             Date of Birth: August 21, 1960           MRN: [id number]             PCP: Orpha Yancey LABOR, MD Referring: Orpha Yancey LABOR, MD Visit Date: 07/06/2024   Subjective:  No chief complaint on file.   History of Present Illness: Brooke Sawyer is a 63 y.o. female here for follow up for seropositive RA on methotrexate  20 mg p.o. weekly and hydroxychloroquine  200 mg daily folic acid  1 mg daily.      Previous HPI 04/05/2024 Brooke Sawyer is a 63 y.o. female here for follow up for seropositive RA on methotrexate  20 mg p.o. weekly and hydroxychloroquine  200 mg daily folic acid  1 mg daily.      Approximately one week ago, she experienced pain and an inability to bend her right fourth finger. The pain was localized to the PIP joint and was described as very sore, even to touch. The inability to bend the finger did not affect her other fingers.   The symptoms lasted for a couple of days, during which she attempted to exercise the finger and applied topical treatments. The pain and stiffness resolved spontaneously after a few days, although there is still slight residual soreness in the finger.   No recent illness or unusual activity recalled that could have precipitated the symptoms.   No other joint pain or stiffness reported. No locking or catching of the affected finger persisting at this time.         Previous HPI 12/28/2023 Brooke Sawyer is a 63 y.o. female here for follow up for seropositive RA on methotrexate  20 mg p.o. weekly and hydroxychloroquine  200 mg daily folic acid  1 mg daily.     There have been no significant changes in her condition since the last visit. She continues to take methotrexate  and hydroxychloroquine  for rheumatoid arthritis without experiencing flare-ups, prolonged morning stiffness, or joint pain. She occasionally uses over-the-counter medications like Tylenol  or ibuprofen for arthritis-related aches and pains, but not  regularly.   She recently went on a cruise to [place], [place], and [place], which lasted a week. During the trip, she experienced seasickness and took Dramamine, which was only partially helpful. She gained one pound during the trip.   No recent illnesses since the last visit.      Previous HPI 09/12/2023 Brooke Sawyer is a 63 y.o. female here for follow up for seropositive RA on methotrexate  20 mg p.o. weekly and hydroxychloroquine  200 mg daily folic acid  1 mg daily.     She has rheumatoid arthritis with no recent flare-ups and no issues with her current medications. Despite a slightly elevated sedimentation rate, she has no new or worsening symptoms, including swelling. She notes that her fingers are naturally 'fat.' There are no significant changes in her condition.   She is undergoing laser treatments for retinal issues related to diabetes, managed by Dr. Valdemar. These treatments are proceeding without complications.      Previous HPI 06/10/2023 Brooke Sawyer is a 63 y.o. female here for follow up for seropositive RA on methotrexate  20 mg p.o. weekly and hydroxychloroquine  200 mg daily folic acid  1 mg daily.  Lab results at previous visit with sedimentation rate 1 point above normal no particular disease activity.  Overall she is doing great with no arthritis flareups and does not have  significant daily symptoms.  No prolonged morning stiffness.  She had additional follow-up with Dr. Valdemar in September due to proliferative diabetic retinopathy.   Previous HPI 03/10/2023 Brooke Sawyer is a 63 y.o. female here for follow up for seropositive RA on methotrexate  20 mg p.o. weekly and hydroxychloroquine  200 mg daily folic acid  1 mg daily.  She is doing well overall no significant disease flare but not noticing any visible joint swelling.  Does not have any prolonged morning stiffness.  She is working on keeping regular physical activity weight appears stable compared to visit earlier this year.  She  missed her original appointment about 2 weeks ago so rescheduled to today.  Had ophthalmology exam looked fine for continuing Plaquenil .   Previous HPI 10/20/2022 Brooke Sawyer is a 63 y.o. female here for follow up for seropositive RA on methotrexate  20 mg p.o. weekly and hydroxychloroquine  200 mg daily folic acid  1 mg daily.  Overall arthritis symptoms are doing well since her last visit no new flareups.  Not having prolonged morning stiffness or significant joint pain during winter weather this year.  She has not had any significant infections since her last visit.  Blood work at previous visit did show mild elevation in sedimentation rate but no particular signs of disease activity.    Previous HPI 06/30/22 Brooke Sawyer is a 63 y.o. female here for follow up for seropositive RA on MTX 20 mg PO weekly and hydroxychloroquine  200 mg daily and folic acid  1 mg daily.  Overall her arthritis symptoms are doing very well since her last visit.  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   Previous HPI 03/30/22 Brooke Sawyer is a 63 y.o. female here for follow up for seropositive RA on MTX 20 mg PO weekly and HCQ 200 mg daily. She feels symptoms are well controlled and no problems with medications. Discussed HCQ screening with her eye doctor at last visit apparently was not specifically addressing this before because it had not been discussed.   Previous HPI 12/16/2021 Brooke Sawyer is a 63 y.o. female here for follow up for seropositive RA on methotrexate  20 mg PO weekly and hydroxychloroquine  200 mg daily. Since our last visit she is doing well no increase in symptoms. No trouble with medications or other health events.   Previous HPI 09/15/2021 Brooke Sawyer is a 63 y.o. female here for follow up for seropositive RA on methotrexate  20 mg PO weekly and hydroxychloroquine  200 mg daily. She  feels symptoms are very well controlled. No significant morning stiffness. No interval complications or infections.    Previous HPI 06/17/21 Brooke Sawyer is a 63 y.o. female here for follow up after initial visit for transfer of care for rheumatoid arthritis initially to continue treatment with methotrexate  20 mg PO weekly. Lab tests at initial visit showing ESR of 34 minimal elevation baseline CBC and CMP unremarkable. Since our last visit she feels symptoms are doing pretty well she finished taking the steroids uneventfully. She saw optometrist told her she needs to see the ophthalmologist, says the last visit in 2020 workup was extensive and had a patient cost of $500 so has not been regularly following up with Dr. Valdemar recently.   Previous HPI 06/03/21 Brooke Sawyer MANDELLA is a 63 y.o. female here for seropositive, erosive RA previously managed at [facility] clinic rheumatology. She was originally  diagnosed in 2019 with symptoms of bilateral finger joints pain and swelling and decreased mobility. Rheumatology workup showing positive RF, CCP, and erosive disease on xray imaging and she started treatment with methotrexate  and folic acid  with initial 1 week prednisone  taper. She has felt overall well controlled disease on this regimen. Some ongoing swelling led to adding hydroxychloroquine  to methotrexate  but had a hard time getting this due to COVID related acces issues so never stayed on it. She changed insurance due to her husband retiring on medicare benefits and unable to follow up with [place] [facility] on new insurance. She is off methotrexate  treatment for several months and has significant finger joint swelling, stiffness, and pain. She gets partial relief with NSAIDs which she currently uses to treated her finger pain and swelling. She broke her 4th and 5th does from a minor trauma with losing her balance. She has never suffered a major bone fracture. She takes daily vitamin D supplementation but reports  never having bone density screening for osteoporosis.   No Rheumatology ROS completed.   PMFS History:  Patient Active Problem List   Diagnosis Date Noted   History of colonic polyps 12/01/2022   High risk medication use 06/03/2021   Proliferative diabetic retinopathy of left eye with macular edema associated with type 2 diabetes mellitus (HCC) 05/27/2021   Type 2 diabetes mellitus with diabetic retinopathy (HCC) 05/25/2021   Seropositive rheumatoid arthritis (HCC) 04/14/2018   Atypical squamous cell changes of undetermined significance (ASCUS) on vaginal cytology 08/23/2013   Partial nontraumatic tear of rotator cuff 10/18/2012   Rotator cuff syndrome of right shoulder 10/18/2012    Past Medical History:  Diagnosis Date   Diabetes mellitus without complication (HCC)    Rheumatoid arthritis (HCC)    Rotator cuff tear     Family History  Problem Relation Age of Onset   Diabetes Mother    Heart disease Mother    Diabetes Father    COPD Sister    Hypertension Brother    Yvone' disease Daughter    Colon cancer Neg Hx    Past Surgical History:  Procedure Laterality Date   COLONOSCOPY N/A 09/27/2012   Procedure: COLONOSCOPY;  Surgeon: Claudis RAYMOND Rivet, MD;  Location: [facility];  Service: Endoscopy;  Laterality: N/A;  1030   COLONOSCOPY WITH PROPOFOL  N/A 12/01/2022   Procedure: COLONOSCOPY WITH PROPOFOL ;  Surgeon: Eartha Angelia Sieving, MD;  Location: [facility];  Service: Gastroenterology;  Laterality: N/A;  11:45AM;ASA 1-2   Dilation and Currettage     POLYPECTOMY  12/01/2022   Procedure: POLYPECTOMY;  Surgeon: Eartha Angelia Sieving, MD;  Location: [facility];  Service: Gastroenterology;;   TUBAL LIGATION     Social History   Social History Narrative   Not on file   Immunization History  Administered Date(s) Administered   Influenza Inj Mdck Quad Pf 05/12/2018, 04/21/2019   Influenza,inj,Quad PF,6+ Mos 05/07/2017   Influenza-Unspecified 05/11/2021    Moderna SARS-COV2 Booster Vaccination 08/11/2021   Moderna Sars-Covid-2 Vaccination 11/08/2019, 12/04/2019     Objective: Vital Signs: LMP 05/25/2016    Physical Exam   Musculoskeletal Exam: ***  CDAI Exam: CDAI Score: -- Patient Global: --; Provider Global: -- Swollen: --; Tender: -- Joint Exam 07/06/2024   No joint exam has been documented for this visit   There is currently no information documented on the homunculus. Go to the Rheumatology activity and complete the homunculus joint exam.  Investigation: No additional findings.  Imaging: No results found.  Recent Labs: Lab Results  Component Value Date   WBC 5.6 04/05/2024   HGB 10.5 (L) 04/05/2024   PLT 192 04/05/2024   NA 139 04/05/2024   K 4.6 04/05/2024   CL 105 04/05/2024   CO2 25 04/05/2024   GLUCOSE 153 (H) 04/05/2024   BUN 19 04/05/2024   CREATININE 0.79 04/05/2024   BILITOT 0.3 04/05/2024   AST 14 04/05/2024   ALT 15 04/05/2024   PROT 6.9 04/05/2024   CALCIUM  8.7 04/05/2024   GFRAA  05/15/2008    >60        The eGFR has been calculated using the MDRD equation. This calculation has not been validated in all clinical    Speciality Comments: PLQ Eye Exam: 03/10/2023 WNL @ [facility] Follow up 1 year  Procedures:  No procedures performed Allergies: Patient has no known allergies.   Assessment / Plan:     Visit Diagnoses: No diagnosis found.  ***  Orders: No orders of the defined types were placed in this encounter.  No orders of the defined types were placed in this encounter.    Follow-Up Instructions: No follow-ups on file.   Tyshae Stair M Elita Dame, CMA  Note - This record has been created using Animal nutritionist.  Chart creation errors have been sought, but may not always  have been located. Such creation errors do not reflect on  the standard of medical care.

## 2024-07-02 ENCOUNTER — Encounter (INDEPENDENT_AMBULATORY_CARE_PROVIDER_SITE_OTHER): Payer: Self-pay | Admitting: Ophthalmology

## 2024-07-02 ENCOUNTER — Ambulatory Visit (INDEPENDENT_AMBULATORY_CARE_PROVIDER_SITE_OTHER): Admitting: Ophthalmology

## 2024-07-02 VITALS — BP 122/72 | HR 76

## 2024-07-02 DIAGNOSIS — H25813 Combined forms of age-related cataract, bilateral: Secondary | ICD-10-CM

## 2024-07-02 DIAGNOSIS — E113513 Type 2 diabetes mellitus with proliferative diabetic retinopathy with macular edema, bilateral: Secondary | ICD-10-CM

## 2024-07-02 DIAGNOSIS — Z7985 Long-term (current) use of injectable non-insulin antidiabetic drugs: Secondary | ICD-10-CM | POA: Diagnosis not present

## 2024-07-02 DIAGNOSIS — H35033 Hypertensive retinopathy, bilateral: Secondary | ICD-10-CM | POA: Diagnosis not present

## 2024-07-02 DIAGNOSIS — I10 Essential (primary) hypertension: Secondary | ICD-10-CM | POA: Diagnosis not present

## 2024-07-02 DIAGNOSIS — E119 Type 2 diabetes mellitus without complications: Secondary | ICD-10-CM

## 2024-07-02 DIAGNOSIS — Z7984 Long term (current) use of oral hypoglycemic drugs: Secondary | ICD-10-CM | POA: Diagnosis not present

## 2024-07-03 DIAGNOSIS — E1122 Type 2 diabetes mellitus with diabetic chronic kidney disease: Secondary | ICD-10-CM | POA: Diagnosis not present

## 2024-07-03 DIAGNOSIS — M05632 Rheumatoid arthritis of left wrist with involvement of other organs and systems: Secondary | ICD-10-CM | POA: Diagnosis not present

## 2024-07-03 DIAGNOSIS — Z6837 Body mass index (BMI) 37.0-37.9, adult: Secondary | ICD-10-CM | POA: Diagnosis not present

## 2024-07-03 DIAGNOSIS — E7849 Other hyperlipidemia: Secondary | ICD-10-CM | POA: Diagnosis not present

## 2024-07-03 DIAGNOSIS — N181 Chronic kidney disease, stage 1: Secondary | ICD-10-CM | POA: Diagnosis not present

## 2024-07-03 DIAGNOSIS — E66813 Obesity, class 3: Secondary | ICD-10-CM | POA: Diagnosis not present

## 2024-07-03 DIAGNOSIS — I10 Essential (primary) hypertension: Secondary | ICD-10-CM | POA: Diagnosis not present

## 2024-07-05 ENCOUNTER — Other Ambulatory Visit: Payer: Self-pay | Admitting: Internal Medicine

## 2024-07-05 DIAGNOSIS — M059 Rheumatoid arthritis with rheumatoid factor, unspecified: Secondary | ICD-10-CM

## 2024-07-05 NOTE — Telephone Encounter (Addendum)
 Last Fill: 04/05/2024  Labs: 04/05/2024 Blood counts and kidney and liver function test are fine no problem with the medication. Her sedimentation rate is a little bit higher than before at 36 but this has been about her usual range for a long time.   Next Visit: 07/06/2024  Last Visit: 04/05/2024  DX: Seropositive rheumatoid arthritis   Current Dose per office note 04/05/2024: methotrexate  20 mg p.o. weekly   Patient to update labs at upcoming appointment on 07/06/2024  Okay to refill Methotrexate ?

## 2024-07-06 ENCOUNTER — Ambulatory Visit: Admitting: Internal Medicine

## 2024-07-06 DIAGNOSIS — M059 Rheumatoid arthritis with rheumatoid factor, unspecified: Secondary | ICD-10-CM

## 2024-07-06 DIAGNOSIS — Z79899 Other long term (current) drug therapy: Secondary | ICD-10-CM

## 2024-07-09 NOTE — Telephone Encounter (Signed)
 Appointment on 12/5 was missed (weather affected, so wouldn't hold against patient). I will send a 1 month refill but recommend her to get labs CBC and CMP checked before additional refills. Then we can see her back in person in March.

## 2024-07-10 ENCOUNTER — Other Ambulatory Visit: Payer: Self-pay | Admitting: *Deleted

## 2024-07-10 DIAGNOSIS — Z79899 Other long term (current) drug therapy: Secondary | ICD-10-CM | POA: Diagnosis not present

## 2024-07-10 LAB — COMPREHENSIVE METABOLIC PANEL WITH GFR
AG Ratio: 1.3 (calc) (ref 1.0–2.5)
ALT: 15 U/L (ref 6–29)
AST: 14 U/L (ref 10–35)
Albumin: 3.9 g/dL (ref 3.6–5.1)
Alkaline phosphatase (APISO): 93 U/L (ref 37–153)
BUN: 14 mg/dL (ref 7–25)
CO2: 31 mmol/L (ref 20–32)
Calcium: 8.8 mg/dL (ref 8.6–10.4)
Chloride: 106 mmol/L (ref 98–110)
Creat: 0.64 mg/dL (ref 0.50–1.05)
Globulin: 3 g/dL (ref 1.9–3.7)
Glucose, Bld: 152 mg/dL — ABNORMAL HIGH (ref 65–99)
Potassium: 4.2 mmol/L (ref 3.5–5.3)
Sodium: 141 mmol/L (ref 135–146)
Total Bilirubin: 0.2 mg/dL (ref 0.2–1.2)
Total Protein: 6.9 g/dL (ref 6.1–8.1)
eGFR: 99 mL/min/1.73m2 (ref >=60)

## 2024-07-10 LAB — CBC WITH DIFFERENTIAL/PLATELET
Absolute Lymphocytes: 2057 {cells}/uL (ref 850–3900)
Absolute Monocytes: 476 {cells}/uL (ref 200–950)
Basophils Absolute: 47 {cells}/uL (ref 0–200)
Basophils Relative: 0.7 %
Eosinophils Absolute: 355 {cells}/uL (ref 15–500)
Eosinophils Relative: 5.3 %
HCT: 31.8 % — ABNORMAL LOW (ref 35.9–46.0)
Hemoglobin: 10.4 g/dL — ABNORMAL LOW (ref 11.7–15.5)
MCH: 28.9 pg (ref 27.0–33.0)
MCHC: 32.7 g/dL (ref 31.6–35.4)
MCV: 88.3 fL (ref 81.4–101.7)
MPV: 12 fL (ref 7.5–12.5)
Monocytes Relative: 7.1 %
Neutro Abs: 3765 {cells}/uL (ref 1500–7800)
Neutrophils Relative %: 56.2 %
Platelets: 201 Thousand/uL (ref 140–400)
RBC: 3.6 Million/uL — ABNORMAL LOW (ref 3.80–5.10)
RDW: 14.7 % (ref 11.0–15.0)
Total Lymphocyte: 30.7 %
WBC: 6.7 Thousand/uL (ref 3.8–10.8)

## 2024-07-23 ENCOUNTER — Ambulatory Visit (HOSPITAL_COMMUNITY)
Admission: RE | Admit: 2024-07-23 | Discharge: 2024-07-23 | Disposition: A | Discharge status: Home / Self Care | Source: Ambulatory Visit | Attending: Internal Medicine | Admitting: Internal Medicine

## 2024-07-23 DIAGNOSIS — Z1231 Encounter for screening mammogram for malignant neoplasm of breast: Secondary | ICD-10-CM | POA: Diagnosis not present

## 2024-08-03 ENCOUNTER — Other Ambulatory Visit: Payer: Self-pay | Admitting: Internal Medicine

## 2024-08-03 DIAGNOSIS — M059 Rheumatoid arthritis with rheumatoid factor, unspecified: Secondary | ICD-10-CM

## 2024-08-03 NOTE — Telephone Encounter (Signed)
 Last Fill: 07/09/2024 (30 day supply)  Labs: 07/10/2024  Glucose 152, RBC 3.60, Hgb 10.4, Hct 31.8  Next Visit: 10/17/2024  Last Visit: 04/05/2024  DX: Seropositive rheumatoid arthritis   Current Dose per office note 04/05/2024: methotrexate  20 mg p.o. weekly   Okay to refill Methotrexate ?

## 2024-08-31 ENCOUNTER — Other Ambulatory Visit: Payer: Self-pay | Admitting: Internal Medicine

## 2024-08-31 DIAGNOSIS — M059 Rheumatoid arthritis with rheumatoid factor, unspecified: Secondary | ICD-10-CM

## 2024-08-31 NOTE — Telephone Encounter (Signed)
 Last Fill: 09/12/2023  Next Visit: 10/07/2024  Last Visit: 04/05/2024  Dx: Seropositive rheumatoid arthritis   Current Dose per office note on 04/05/2024: folic acid  1 mg daily   Okay to refill Folic Acid ?

## 2024-10-17 ENCOUNTER — Ambulatory Visit: Admitting: Internal Medicine

## 2025-04-04 ENCOUNTER — Encounter (INDEPENDENT_AMBULATORY_CARE_PROVIDER_SITE_OTHER): Admitting: Ophthalmology
